# Patient Record
Sex: Female | Born: 1960 | Race: White | Hispanic: No | Marital: Married | Smoking: Never smoker
Health system: Southern US, Community
[De-identification: ages and names within clinical notes are randomized; demographics above are authoritative.]

## PROBLEM LIST (undated history)

## (undated) DIAGNOSIS — E7800 Pure hypercholesterolemia, unspecified: Secondary | ICD-10-CM

## (undated) HISTORY — PX: DILATION AND CURETTAGE OF UTERUS: SHX78

## (undated) HISTORY — PX: EYE SURGERY: SHX253

## (undated) HISTORY — DX: Pure hypercholesterolemia, unspecified: E78.00

## (undated) HISTORY — DX: Gilbert syndrome: E80.4

---

## 2003-05-22 ENCOUNTER — Encounter: Admission: RE | Admit: 2003-05-22 | Discharge: 2003-05-22 | Payer: Self-pay | Admitting: Gastroenterology

## 2003-05-22 IMAGING — US US ABDOMEN COMPLETE
1 series · 14 of 25 positions shown · non-contrast
Comparison: none

CLINICAL DATA: Elevated Bilirubin.
 ABDOMINAL ULTRASOUND COMPLETE
 Comparison ? none.
TECHNIQUE: Real-time multiplanar gray scale ultrasonography of the abdomen was performed.

[Series 1: unknown · 0.32mm/px · 14 of 63 slices shown]
[im 1/63]
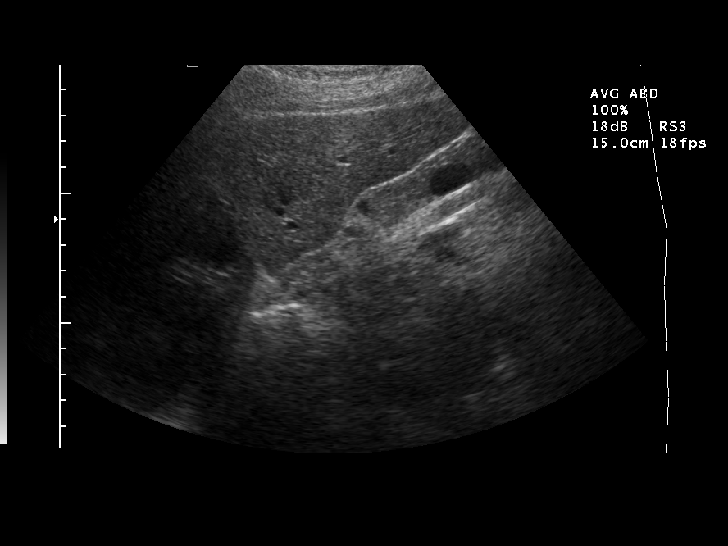
[im 6/63]
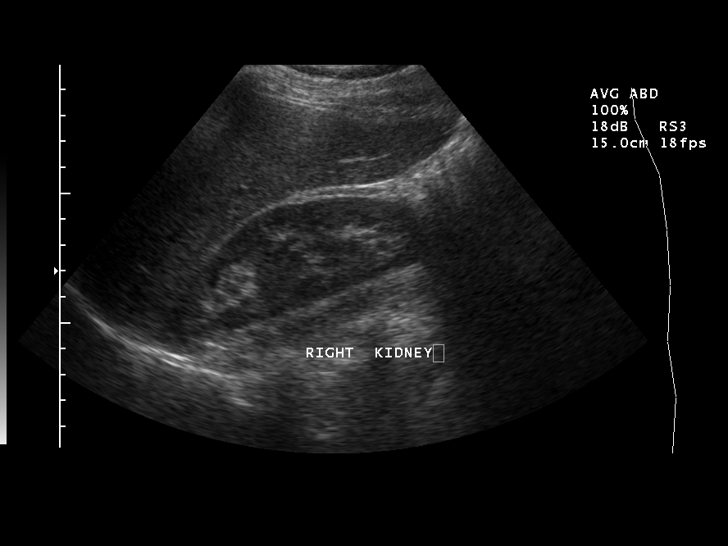
[im 11/63]
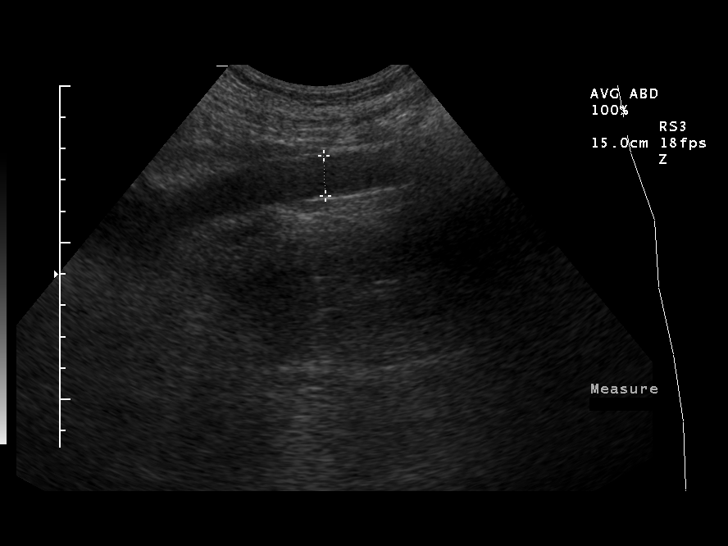
[im 16/63]
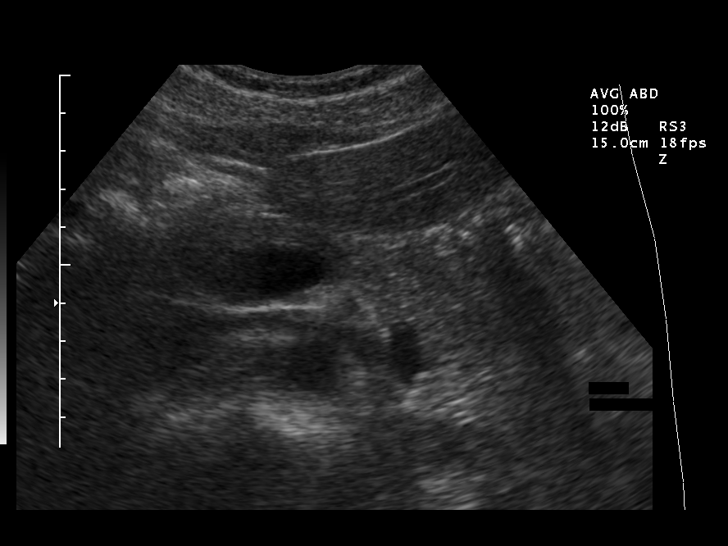
[im 21/63]
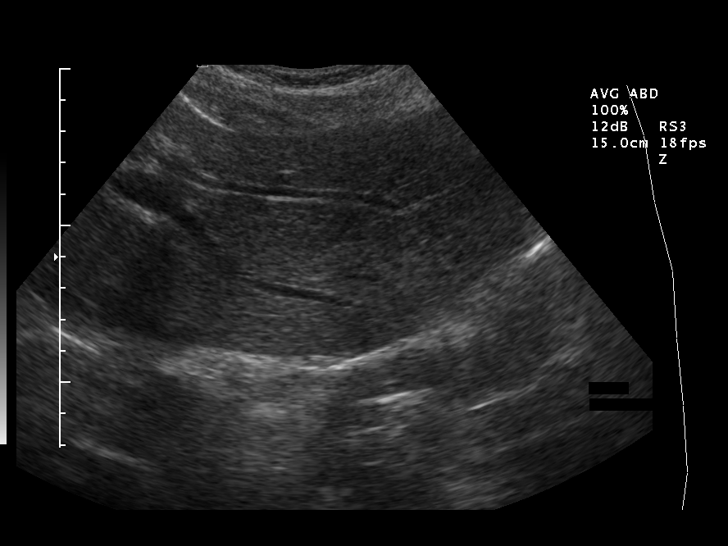
[im 24/63]
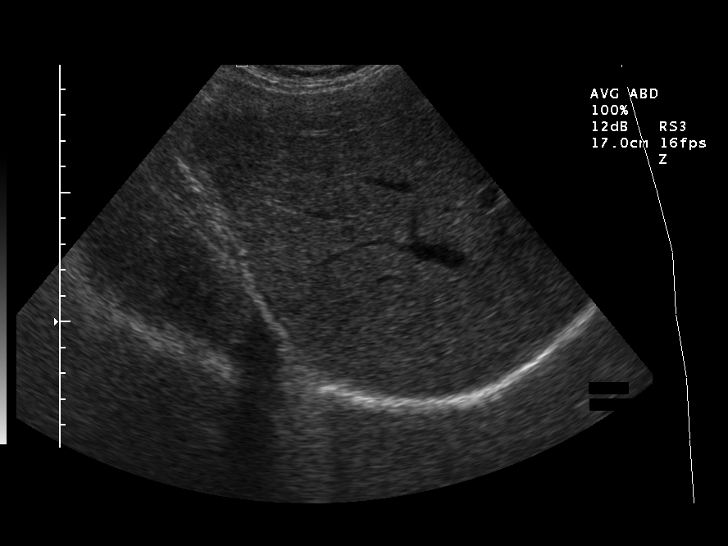
[im 29/63]
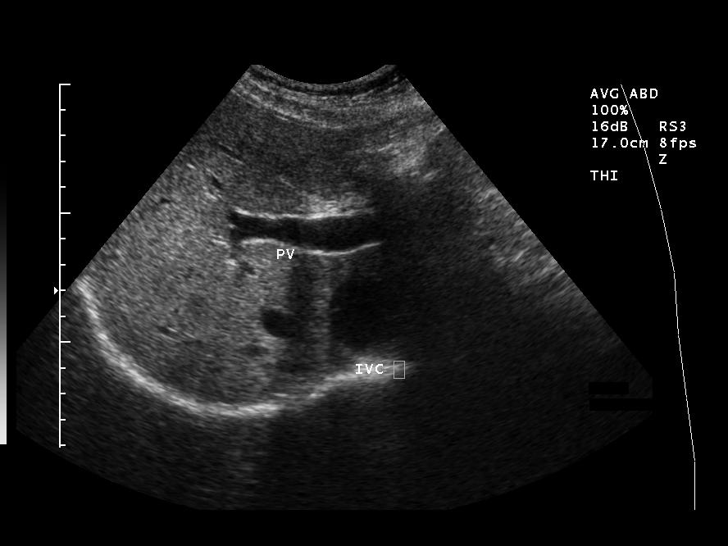
[im 34/63]
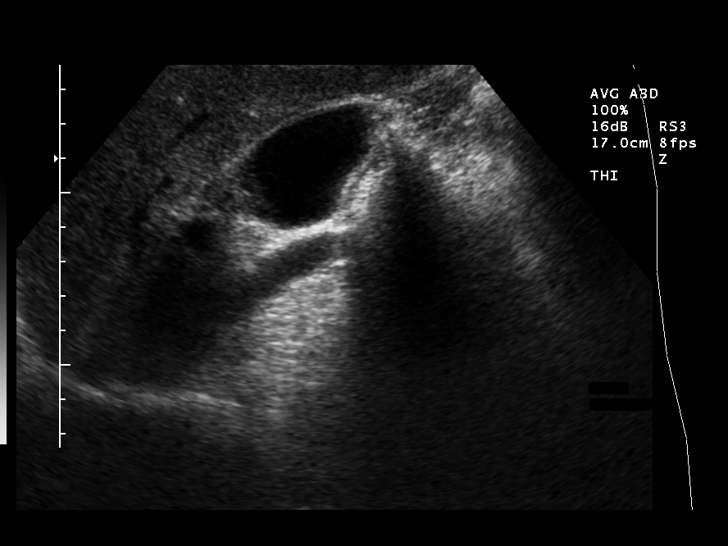
[im 39/63]
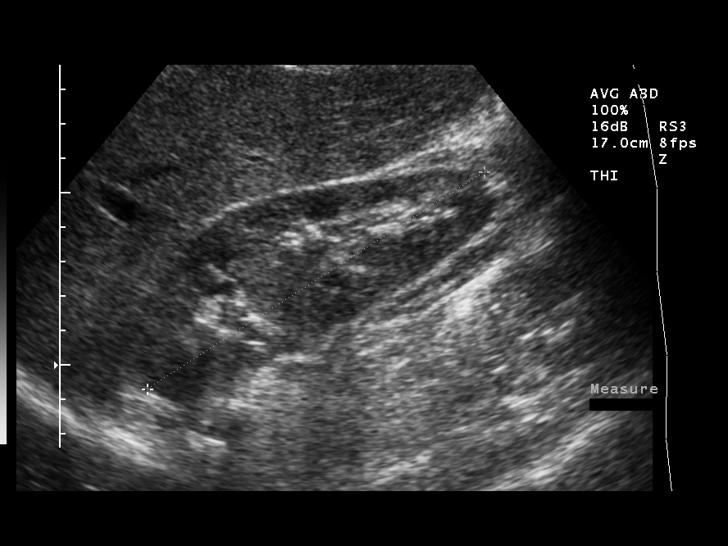
[im 42/63]
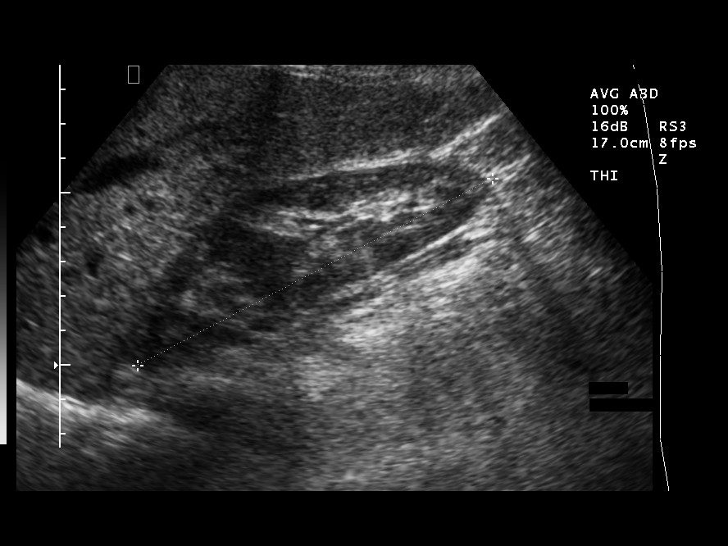
[im 47/63]
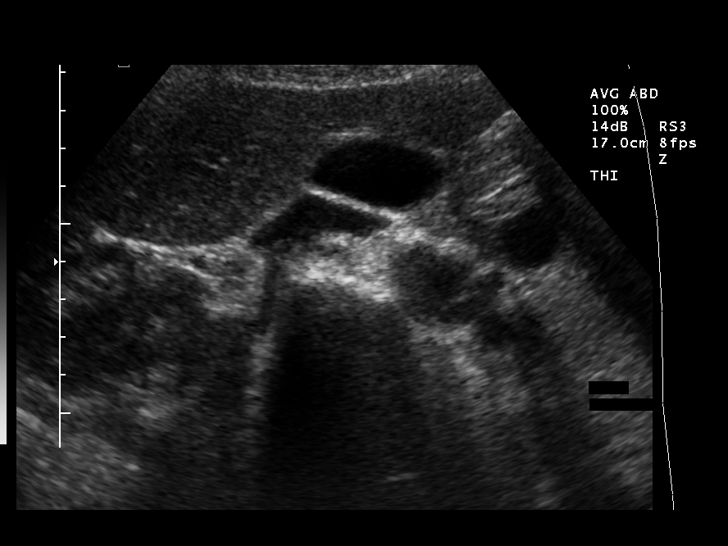
[im 52/63]
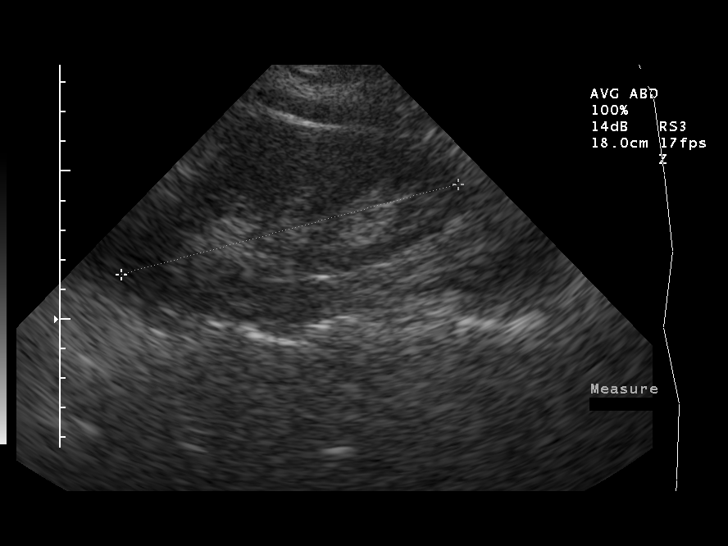
[im 57/63]
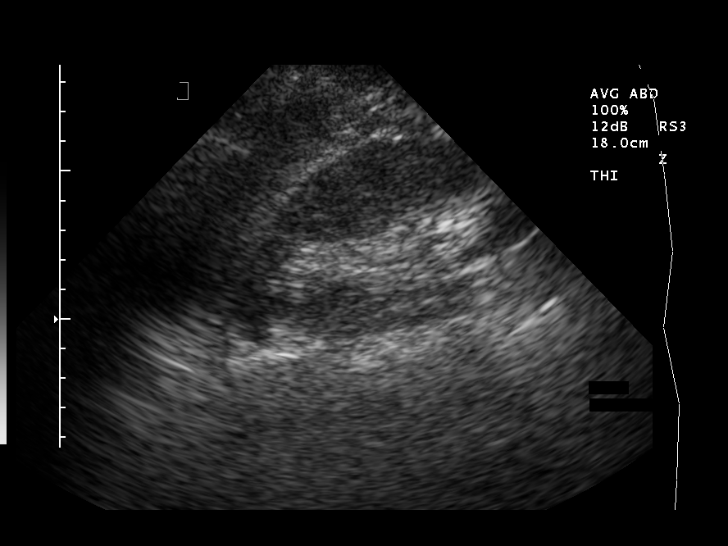
[im 63/63]
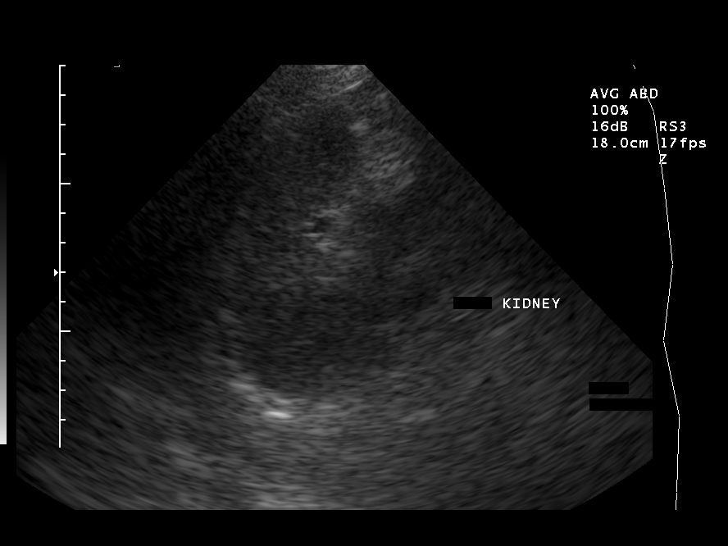

[14 of 25 positions shown; findings below may reference images not displayed]

FINDINGS: No focal abnormality is identified in the liver.  There is no intra or extrahepatic biliary duct dilatation.  The gallbladder
 has normal sonographic features.  
 The pancreas, aorta, spleen, and kidneys are unremarkable.  
 IMPRESSION
 Normal abdominal ultrasound.

## 2007-02-15 ENCOUNTER — Encounter: Admission: RE | Admit: 2007-02-15 | Discharge: 2007-02-15 | Payer: Self-pay | Admitting: Family Medicine

## 2007-02-15 IMAGING — MG MM DIAGNOSTIC BILATERAL
5 series · 5 of 5 positions shown · non-contrast
Comparison: There are no prior studies available for comparison.

DG DIAGNOSTIC BILATERAL
Bilateral CC and MLO view(s) were taken.

BILATERAL BREAST ULTRASOUND
Technologist: JARON, Medical
DIGITAL BILATERAL  DIAGNOSTIC MAMMOGRAM WITH CAD AND BILATERAL BREAST ULTRASOUND:
CLINICAL DATA: Palpable nodule right breast at approximately the 12 o'clock position.

[R CC]
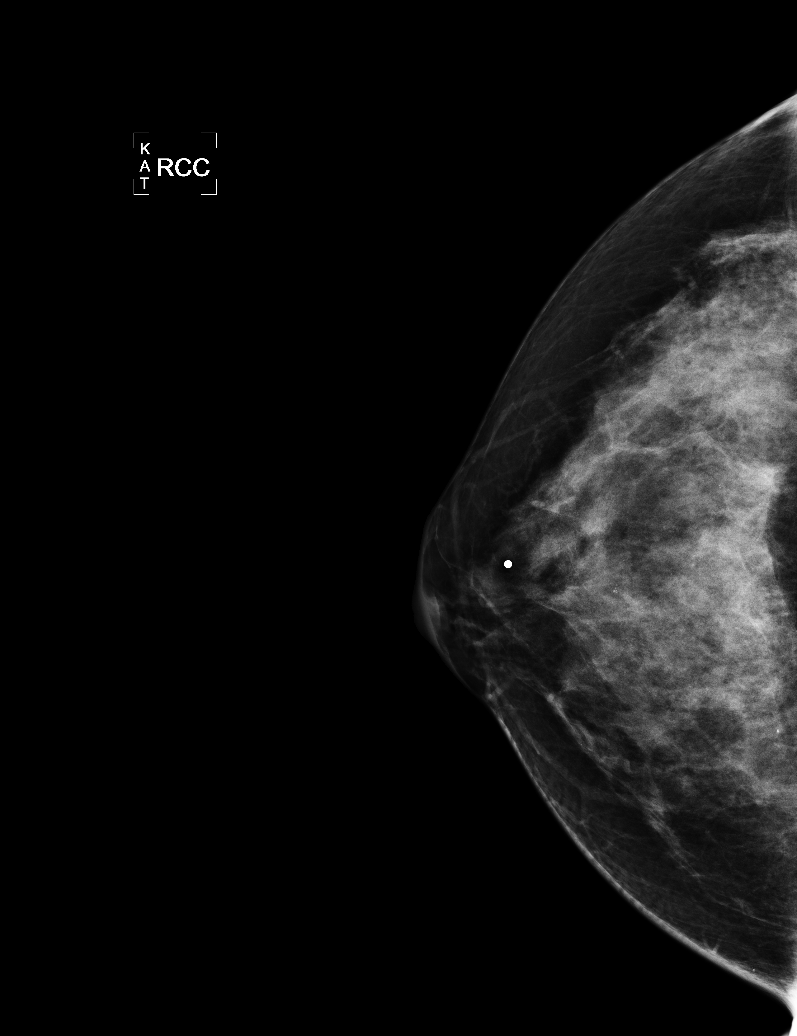

[L CC]
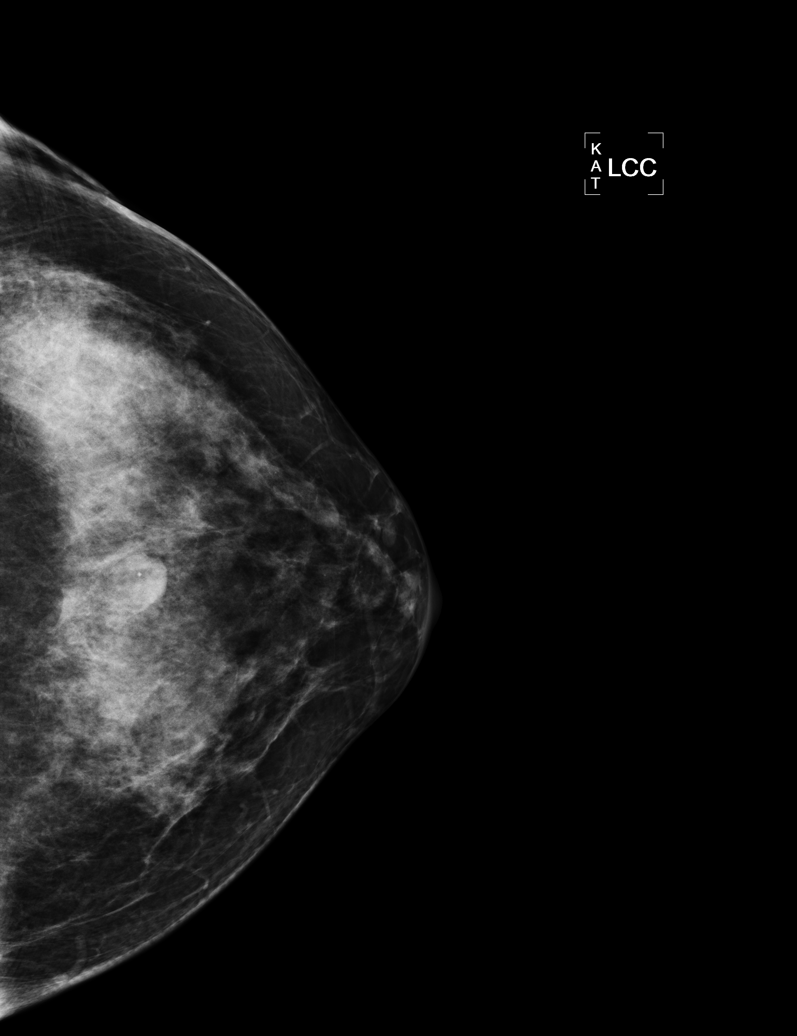

[L MLO]
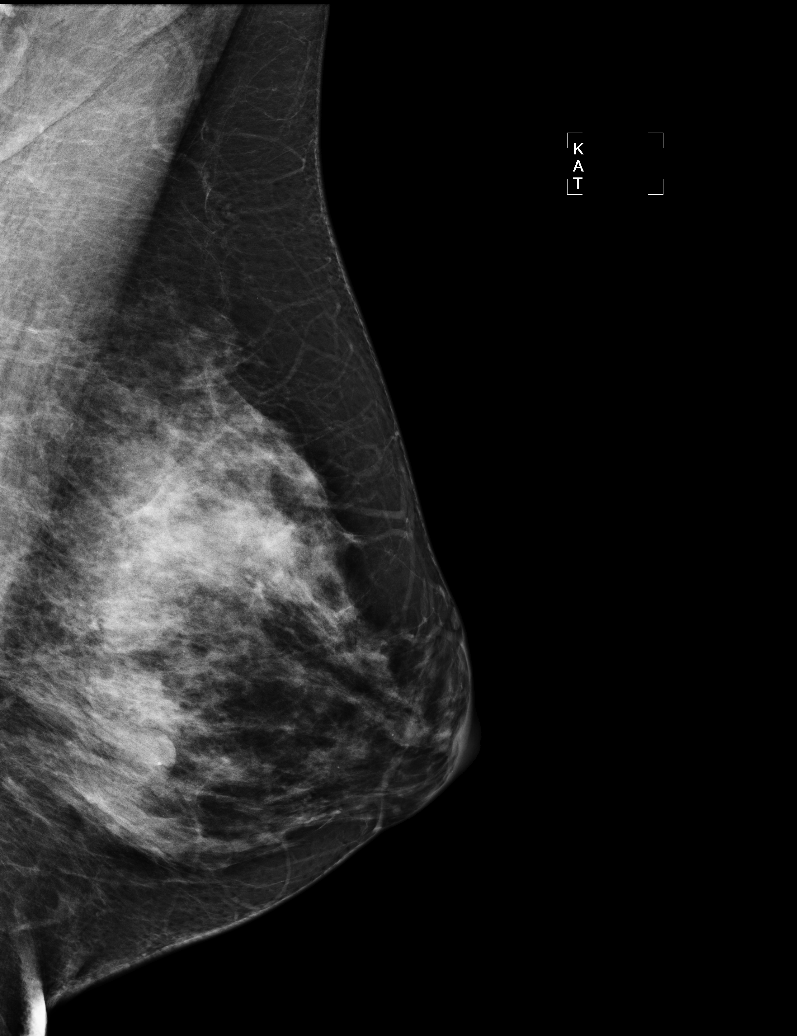

[R MLO]
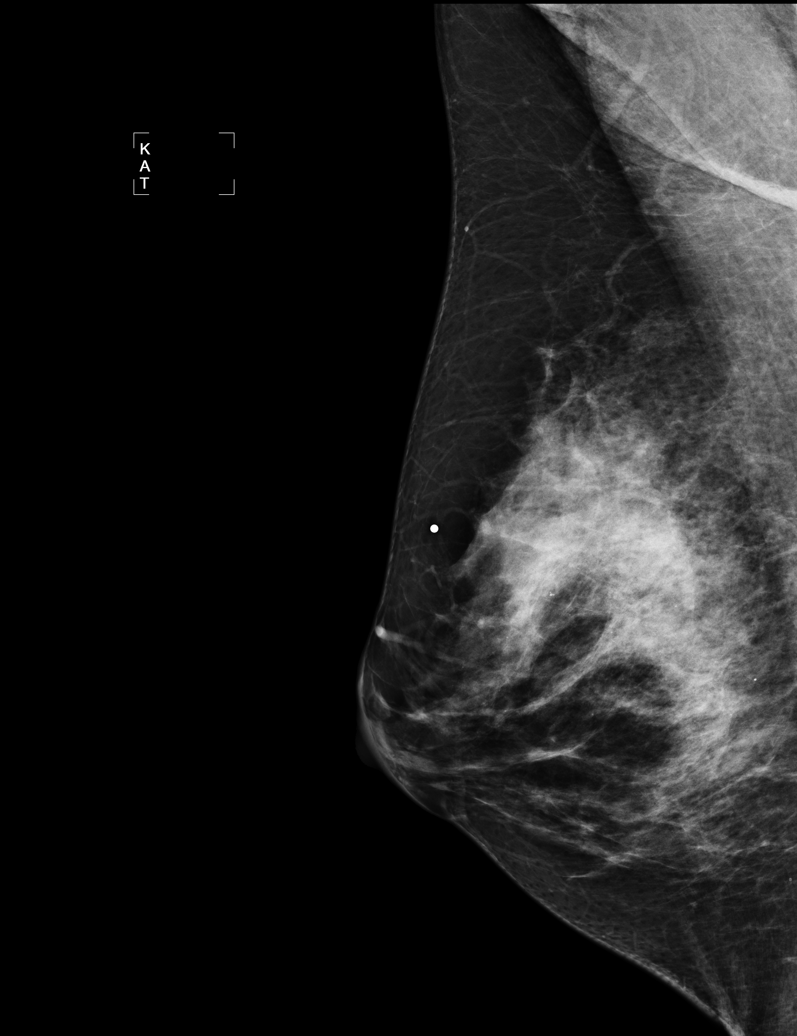

[R TAN]
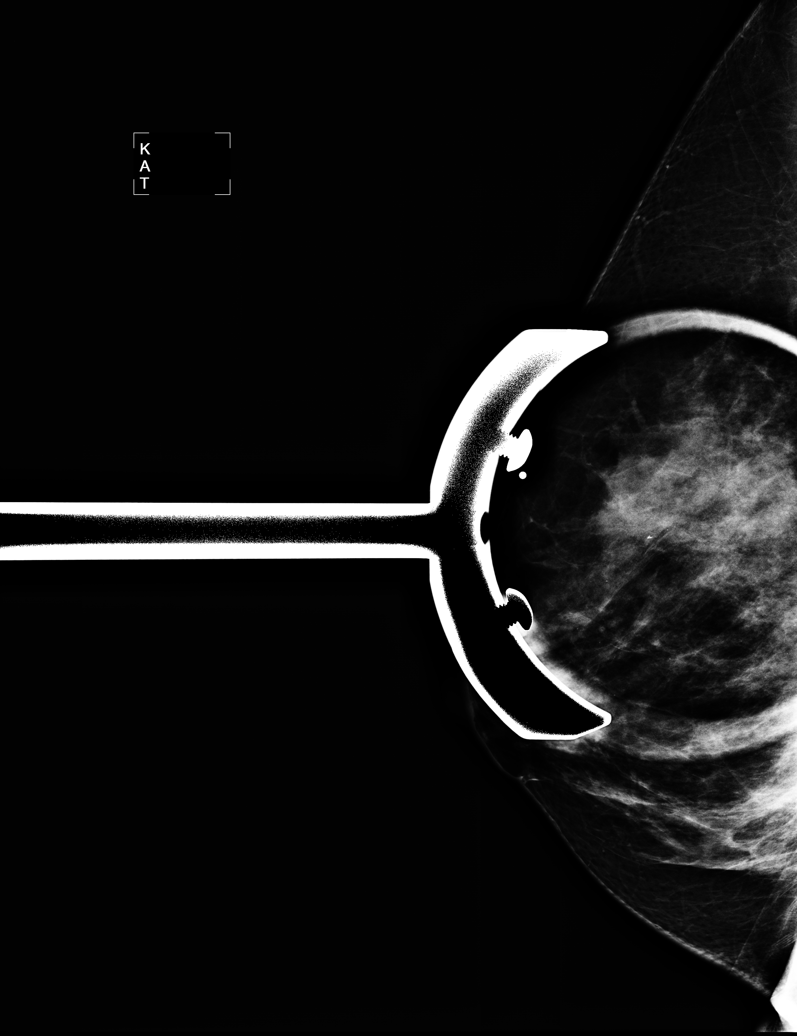

[5 of 5 positions shown; findings below may reference images not displayed]

There is an extremely dense breast parenchymal pattern.  There is a partially circumscribed nodular
density seen within the left breast at approximately the 4-5 o'clock position.  There are 
benign-appearing scattered microcalcifications present bilaterally.

Right breast ultrasound was performed in the area of the palpable area of nodularity.  There is a 
firm palpable nodule located at the 12 o'clock position within the right breast 3 cm from the 
nipple.  In this region, there are two adjacent cysts with a more anteriorly located cyst with a 
thickened wall measuring 1.3 cm in diameter and a more posteriorly located cyst also measuring
cm in diameter.  The patient is tender in this region and this likely represents an inflamed 
fibrocystic complex.  I do recommend repeat right breast ultrasound with physical examination in 
six weeks with attention to this area.  The importance of breast self examination was stressed with
the patient as well as yearly mammography.

Ultrasound of the left breast demonstrates a simple cyst measuring 1.3 cm in size located at the 5 
o'clock position within the left breast 2 cm from the nipple corresponding to smooth nodular 
density seen in this region on mammography.
IMPRESSION: 1.  Palpable area of nodularity located at the 12 o'clock position within the right breast which is
tender and likely is due to a fibrocystic complex with several adjacent cysts and the more 
anteriorly located cyst does contain thickened wall.  I would recommend follow-up right breast 
ultrasound in six weeks.  If the area persists and has not improved consider biopsy.
2.  Simple-appearing 1.3 cm in size located within the left breast at the 5 o'clock position 
corresponding to nodular density seen on mammography.

ASSESSMENT: Probably benign - BI-RADS 3

Ultrasound of the right breast in 2 months.
ANALYZED BY COMPUTER AIDED DETECTION. ,

## 2007-03-29 ENCOUNTER — Encounter: Admission: RE | Admit: 2007-03-29 | Discharge: 2007-03-29 | Payer: Self-pay | Admitting: Family Medicine

## 2007-03-29 IMAGING — US UNKNOWN US STUDY
1 series · 9 of 9 positions shown · non-contrast
Comparison: none

RIGHT BREAST ULTRASOUND

CLINICAL DATA: Tenderness of the right breast noted in [DATE] with an area of palpable 
nodularity at the 12 o'clock position within the right breast which demonstrated a fibrocystic 
complex with mild low level internal echoes present reevaluation.

[Series 1: unknown us study · 9 of 9 slices shown]
[im 1/9]
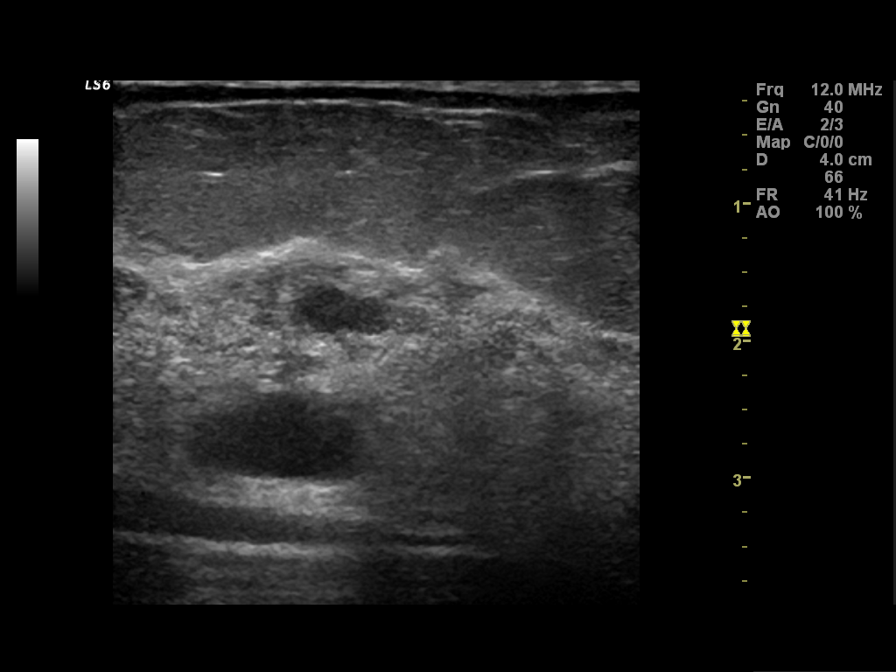
[im 2/9]
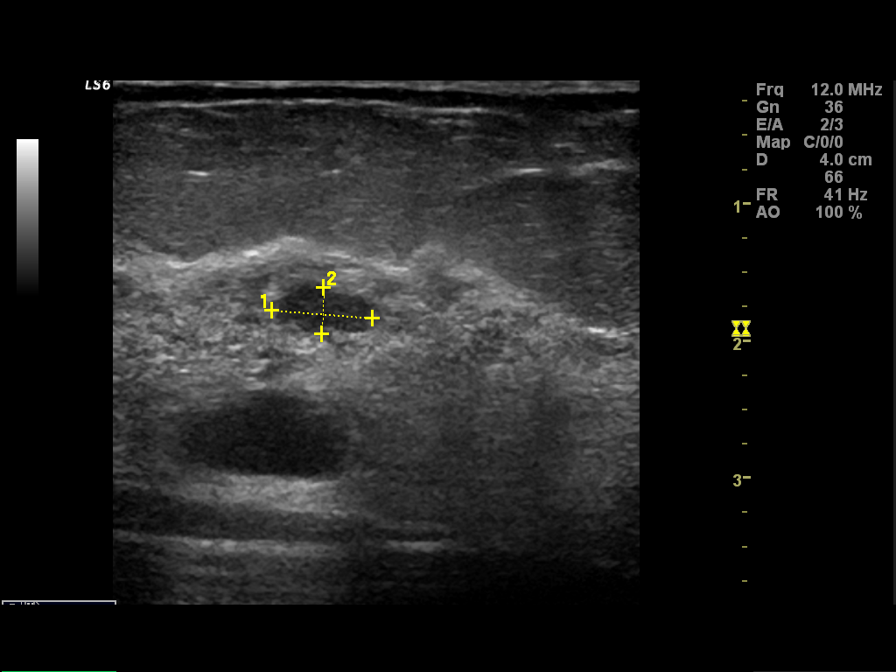
[im 3/9]
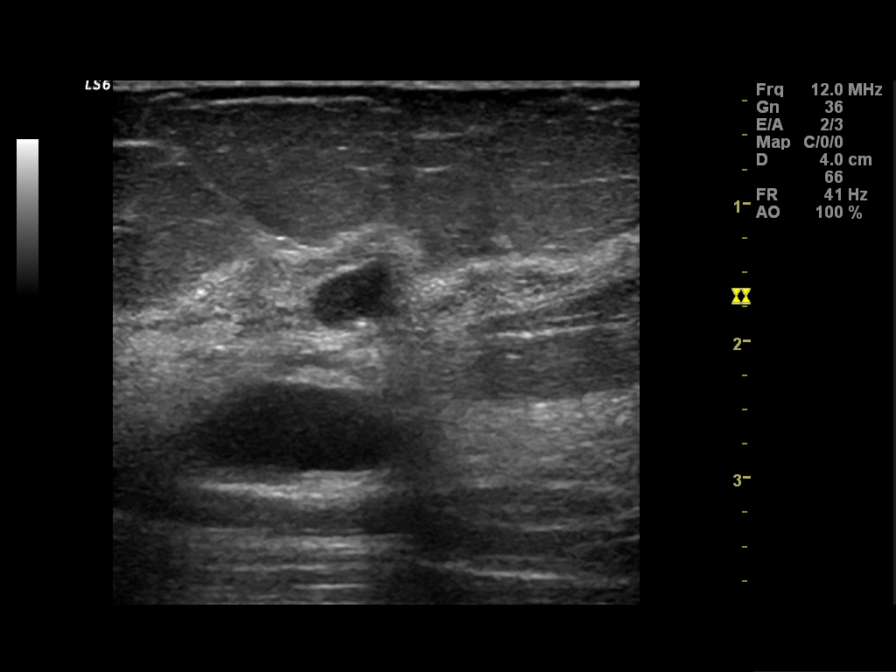
[im 4/9]
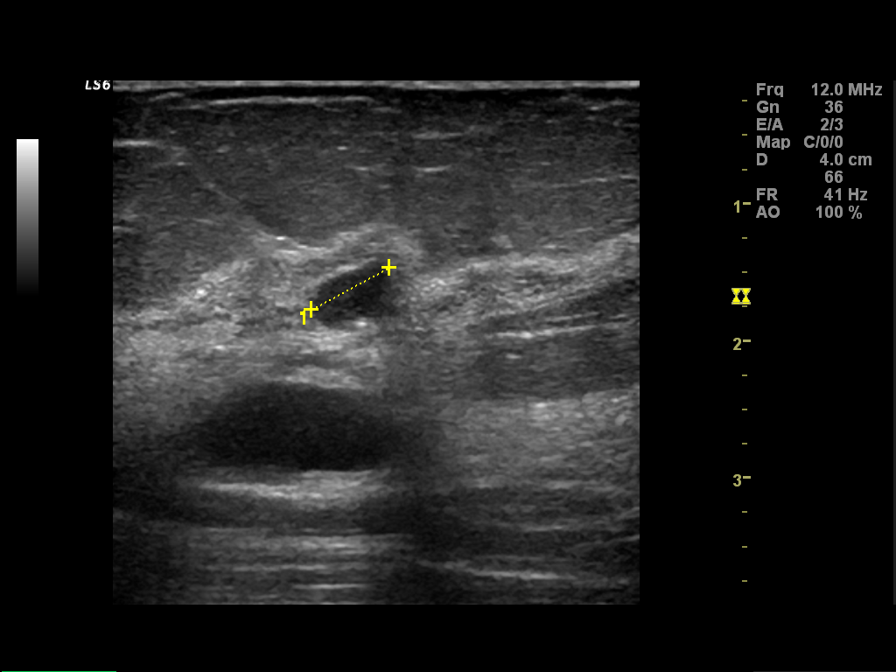
[im 5/9]
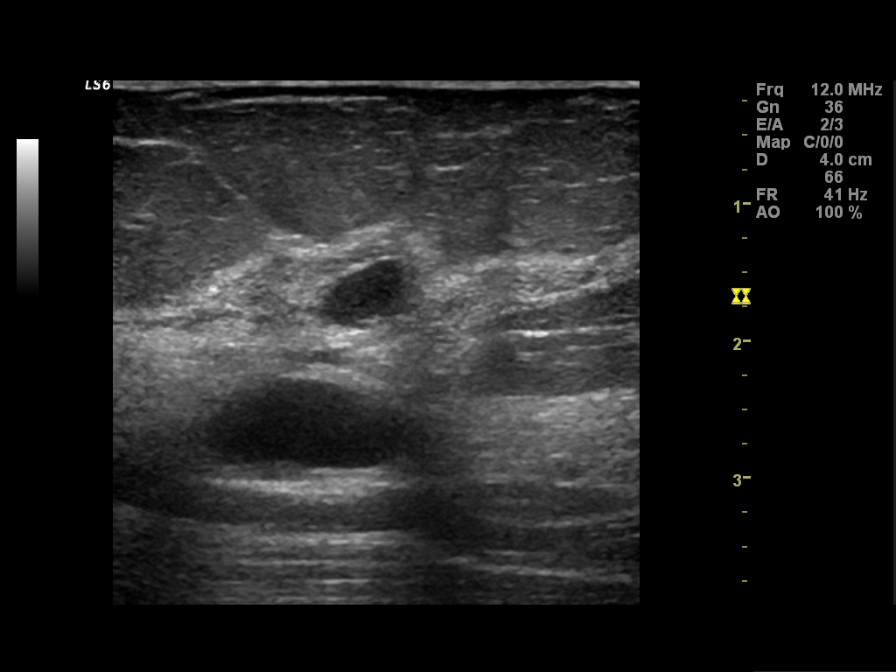
[im 6/9]
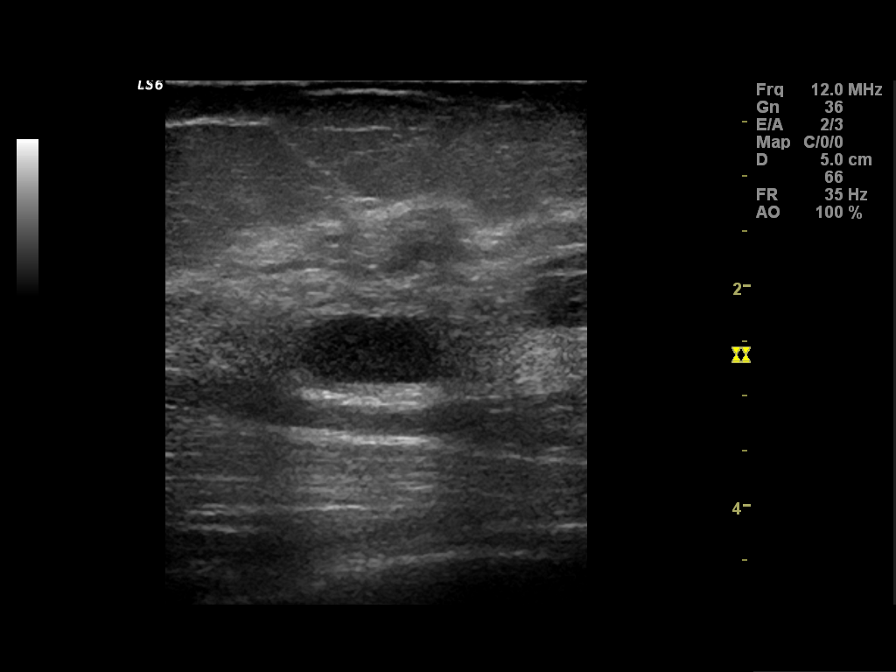
[im 7/9]
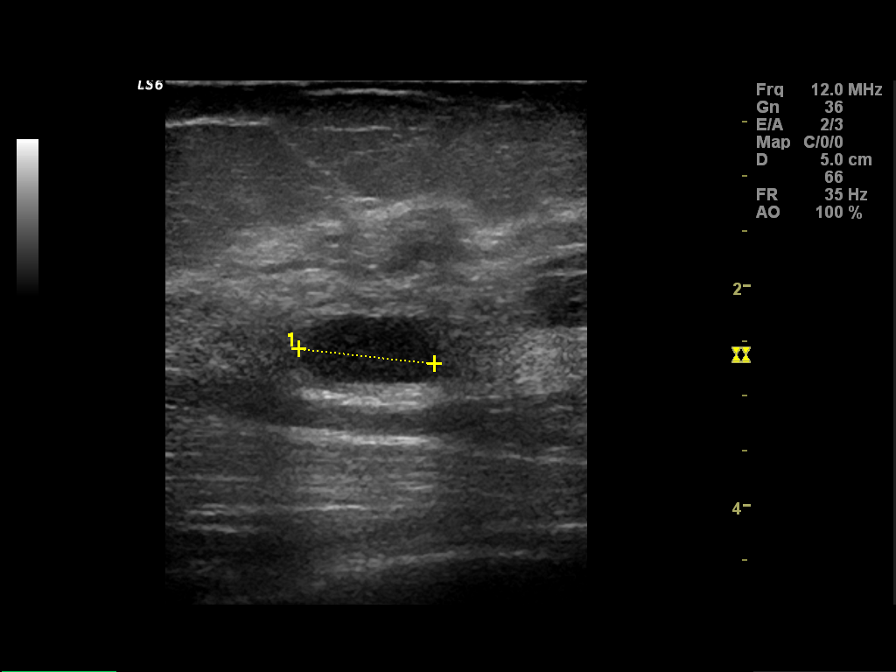
[im 8/9]
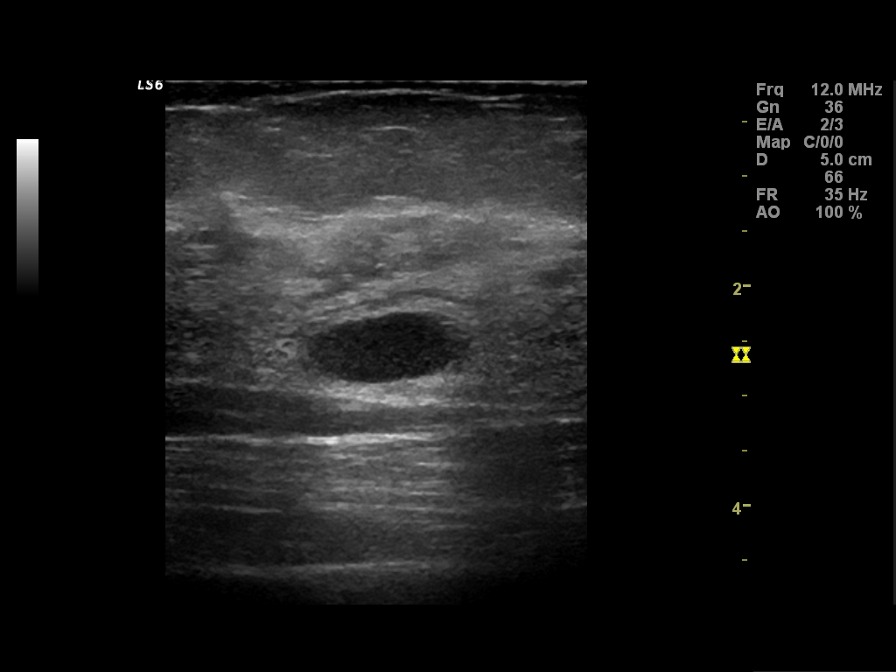
[im 9/9]
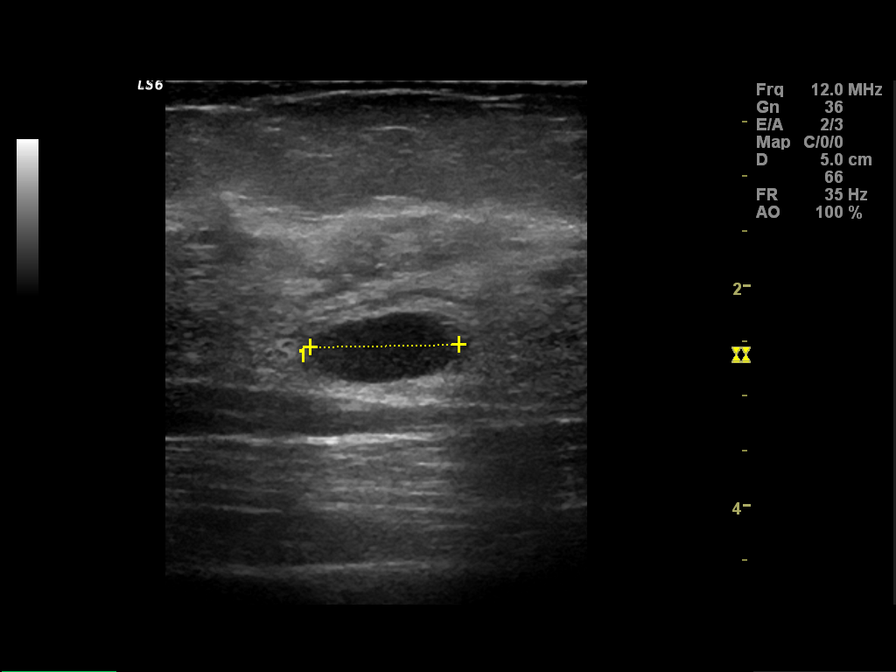

[9 of 9 positions shown; findings below may reference images not displayed]

Current study is compared with the prior study dated [DATE].

On today's study, the complex cyst located at the 12 o'clock position within the right breast 3 cm 
from the nipple has decreased in size and no measures 6 x 7 x 3 mm in size (previously 1.3 cm in 
diameter).  This is consistent with a resolving cyst.  There is a second 1.3 cm in size 
simple-appearing cyst located posterior to this mildly complex cyst located at the 12 o'clock 
position within the right breast as well.
IMPRESSION: Interval decrease in size of a complex cyst located at the 12 o'clock position within the right 
breast as discussed above.  This is consistent with a benign finding.  Recommend follow-up 
bilateral screening mammography in [DATE].

ASSESSMENT: Benign - BI-RADS 2

Screening mammogram of both breasts in 10 months.
,

## 2008-02-21 ENCOUNTER — Encounter: Admission: RE | Admit: 2008-02-21 | Discharge: 2008-02-21 | Payer: Self-pay | Admitting: Family Medicine

## 2008-02-21 IMAGING — MG MM DIGITAL SCREENING BILAT W/ CAD
4 series · 4 of 4 positions shown · non-contrast
Comparison: none

DG SCREEN MAMMOGRAM BILATERAL
Bilateral CC and MLO view(s) were taken.

DIGITAL SCREENING MAMMOGRAM WITH CAD:
The breast tissue is heterogeneously dense.  No masses or malignant type calcifications are 
identified.  Compared with prior studies.

[R CC]
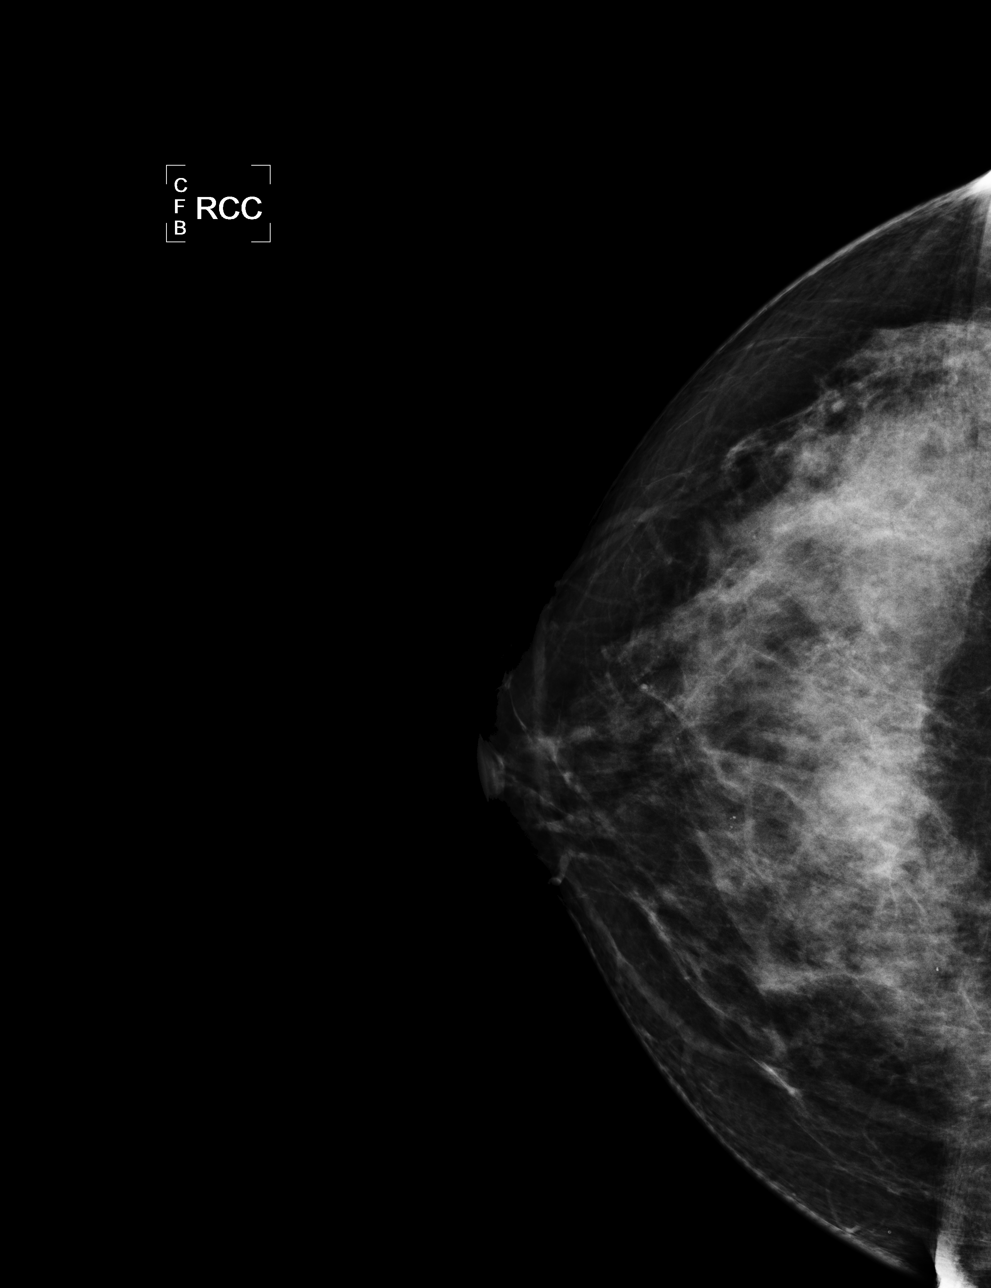

[L CC]
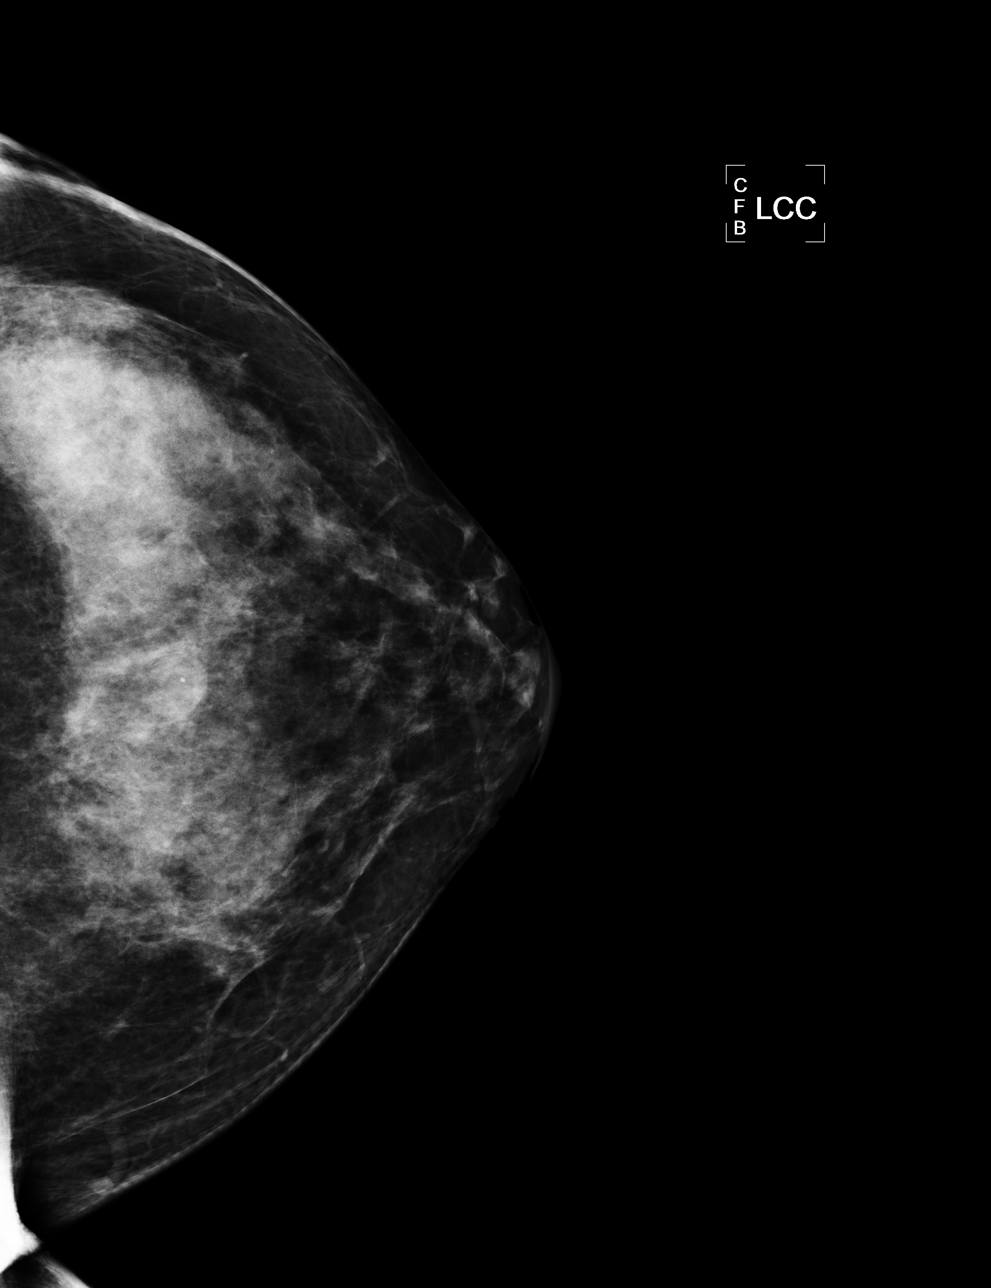

[L MLO]
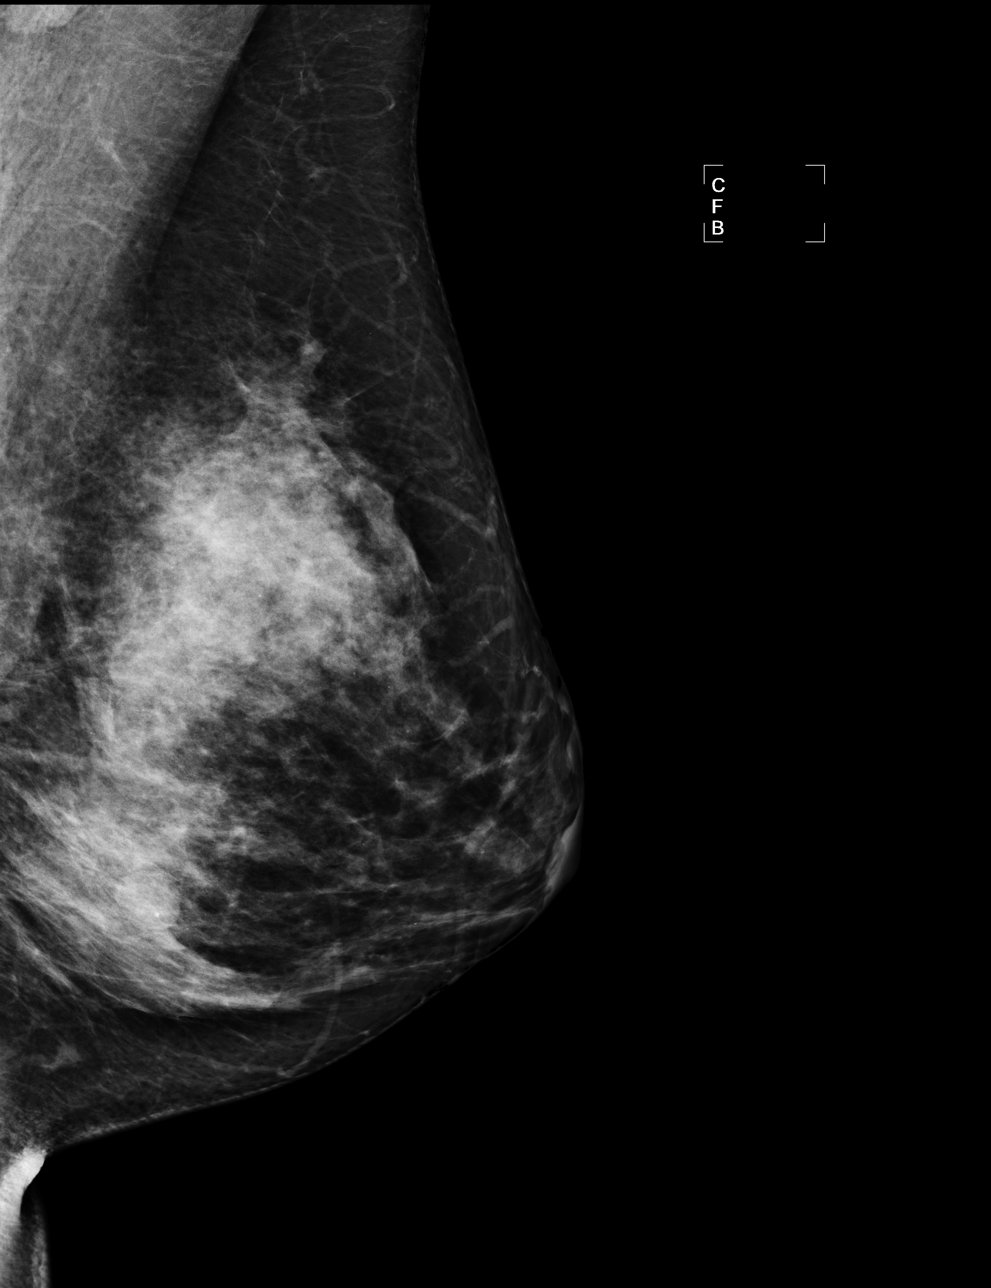

[R MLO]
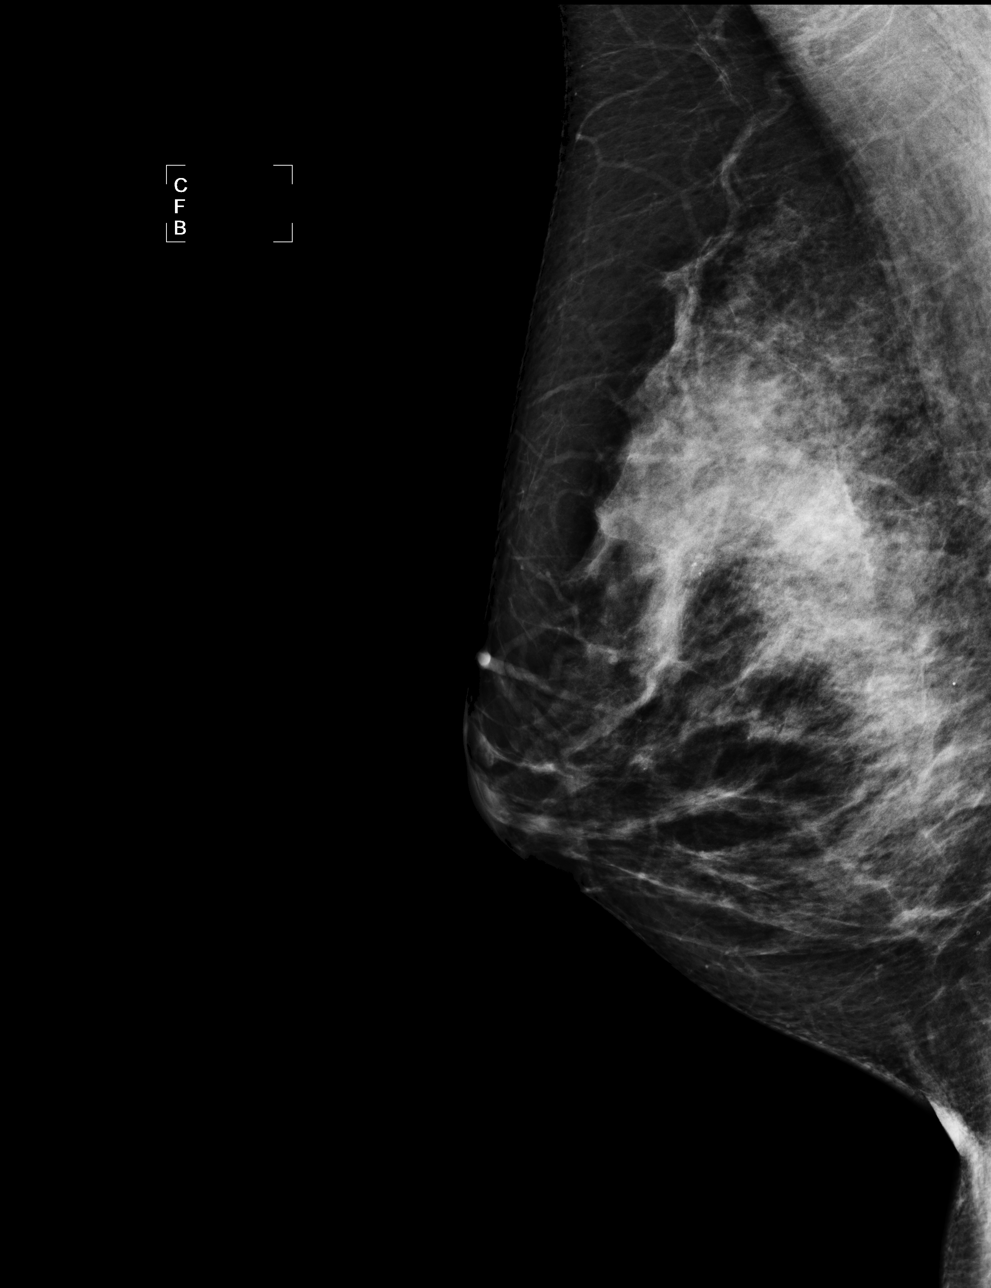

[4 of 4 positions shown; findings below may reference images not displayed]

IMPRESSION: No specific mammographic evidence of malignancy.  Next screening mammogram is recommended in one 
year.

ASSESSMENT: Negative - BI-RADS 1

Screening mammogram in 1 year.
ANALYZED BY COMPUTER AIDED DETECTION. , THIS PROCEDURE WAS A DIGITAL MAMMOGRAM.

## 2009-02-20 ENCOUNTER — Encounter: Admission: RE | Admit: 2009-02-20 | Discharge: 2009-02-20 | Payer: Self-pay | Admitting: Family Medicine

## 2009-02-20 IMAGING — MG MM SCREEN MAMMOGRAM BILATERAL
4 series · 4 of 4 positions shown · non-contrast
Comparison: none

DG SCREEN MAMMOGRAM BILATERAL
Bilateral CC and MLO view(s) were taken.

DIGITAL SCREENING MAMMOGRAM WITH CAD:
The breast tissue is heterogeneously dense.  No masses or malignant type calcifications are 
identified.  Compared with prior studies.
Images were processed with CAD.

[R CC]
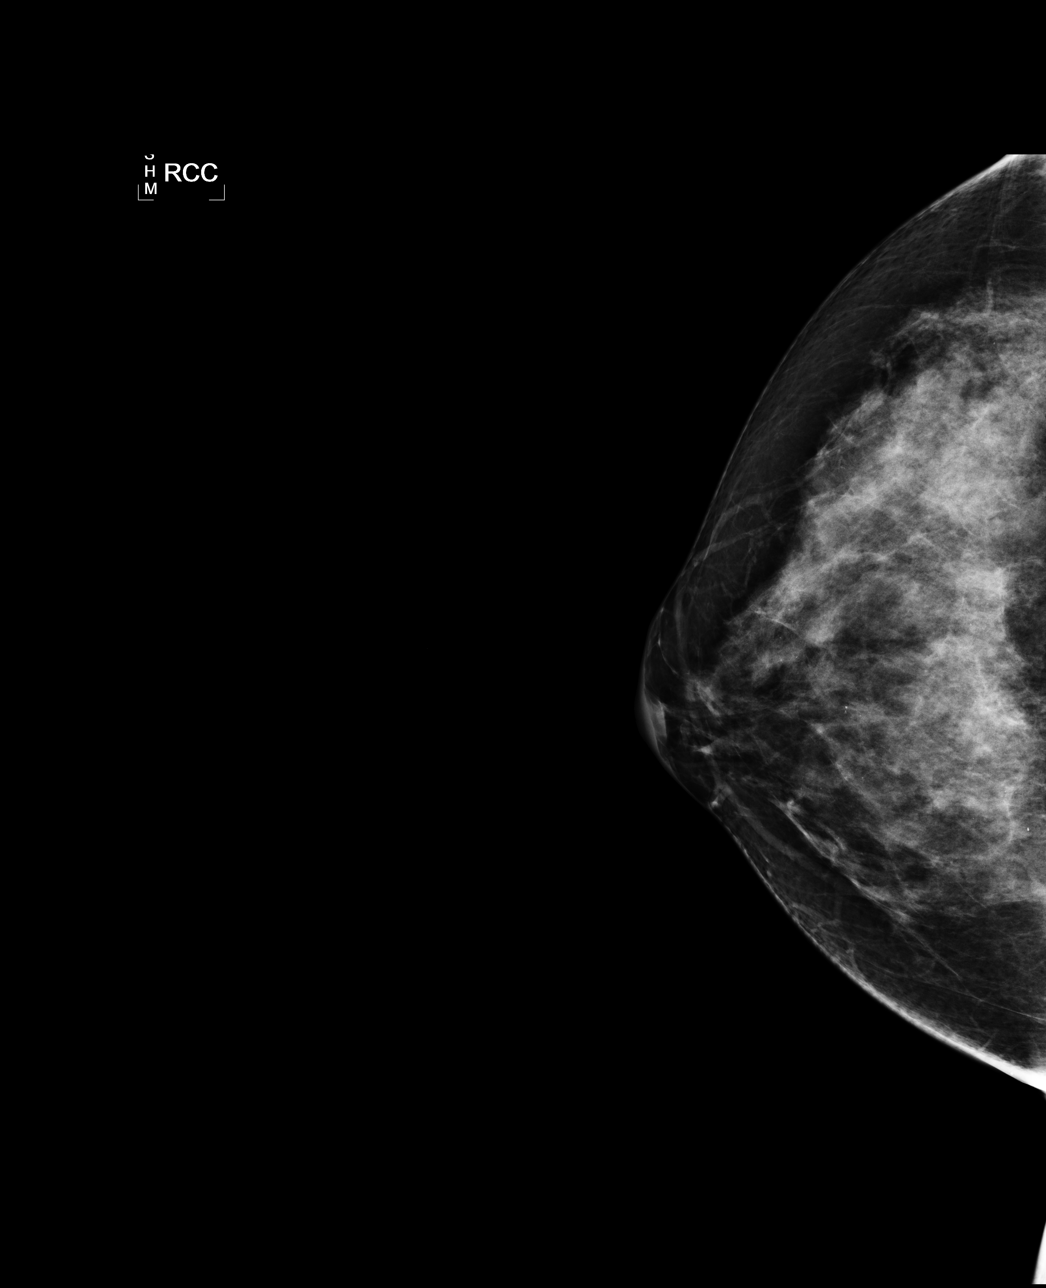

[L CC]
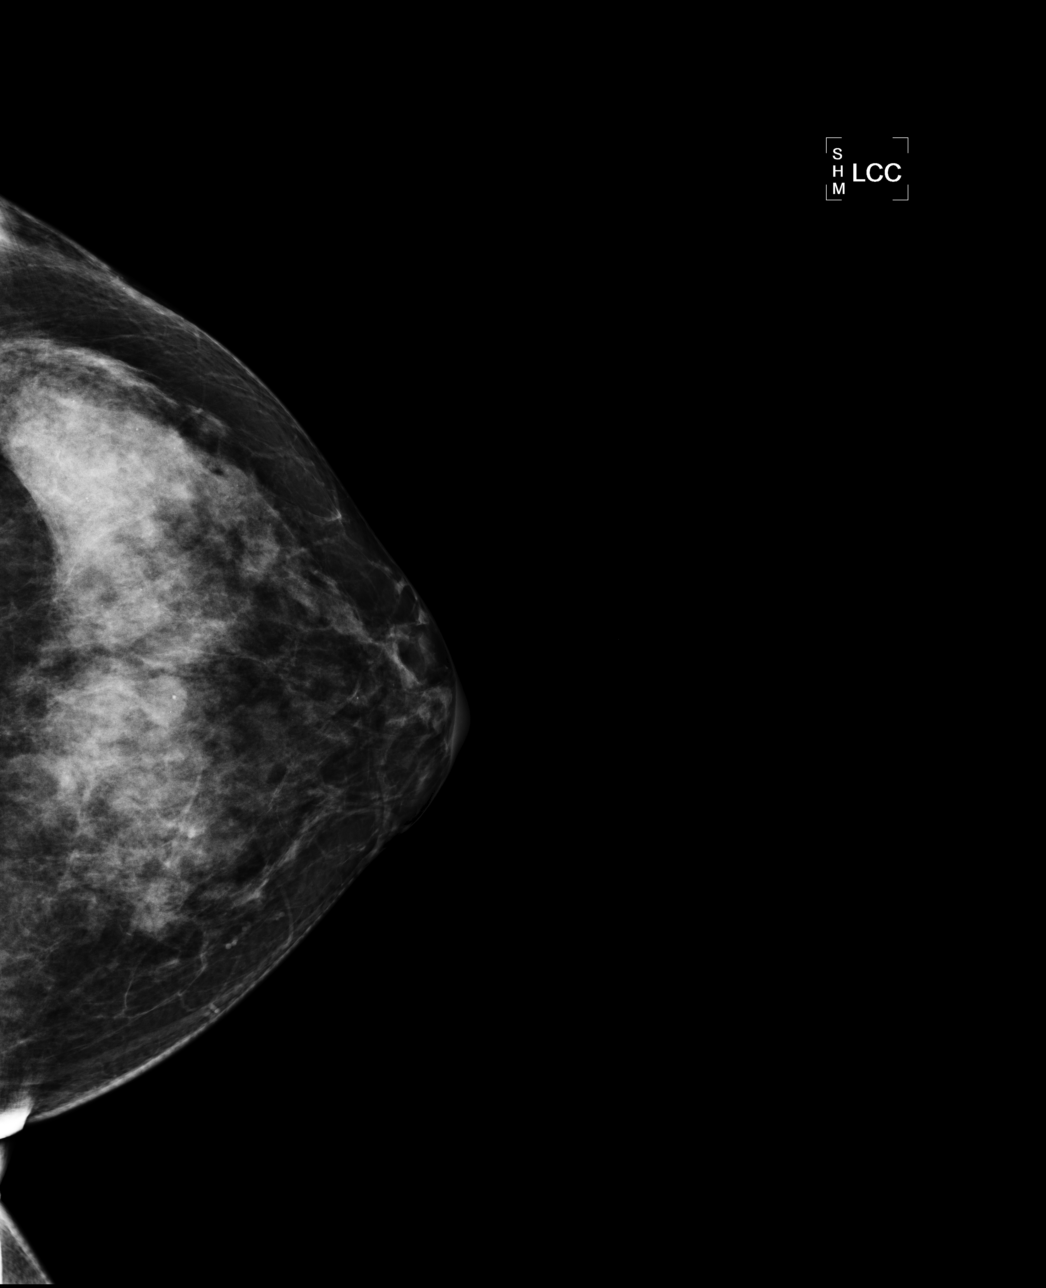

[L MLO]
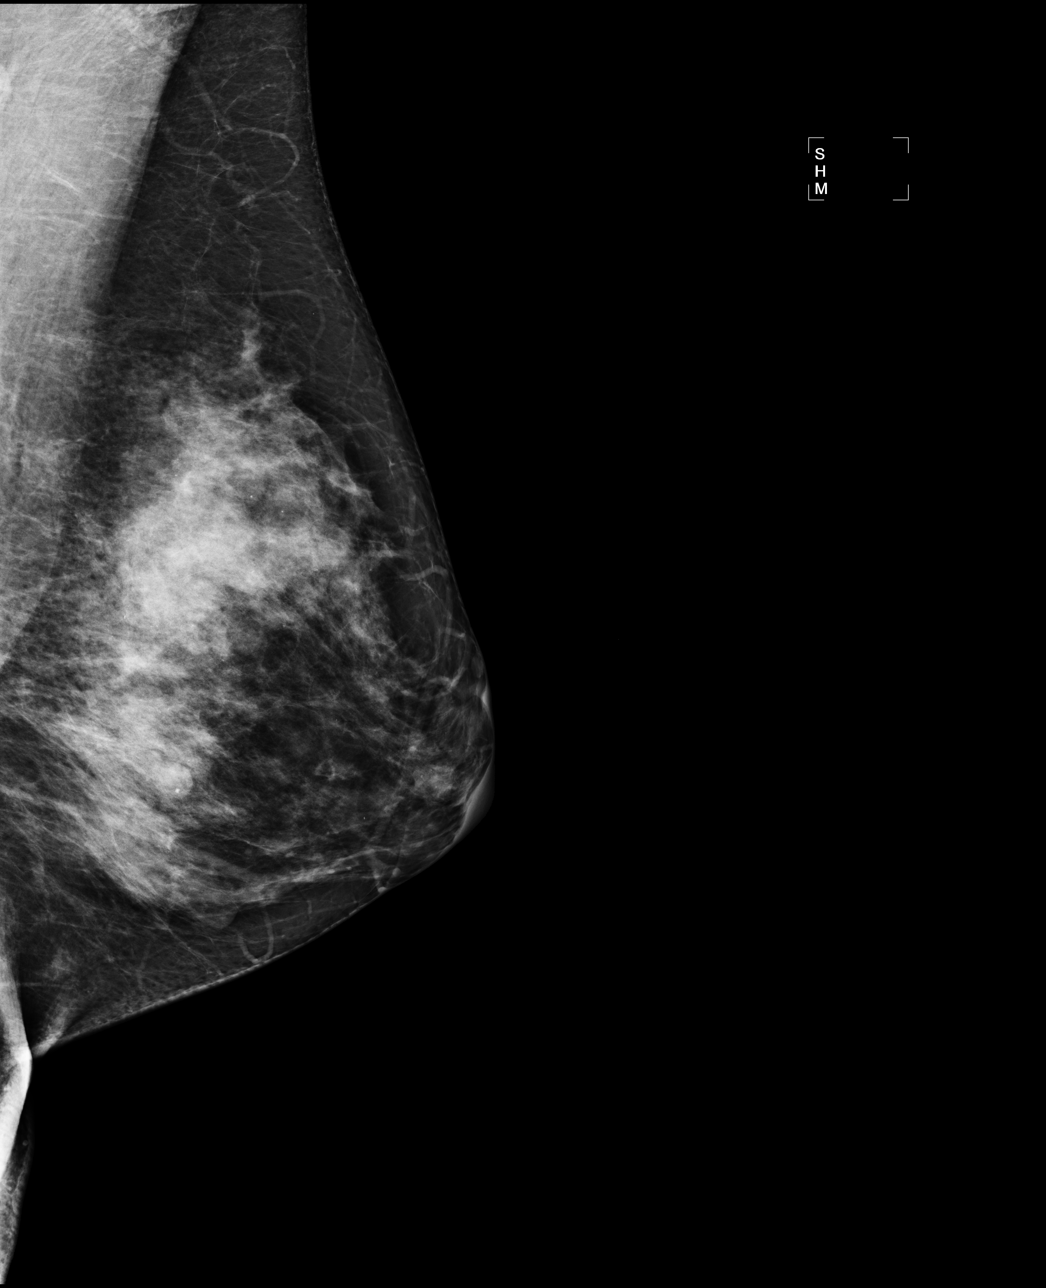

[R MLO]
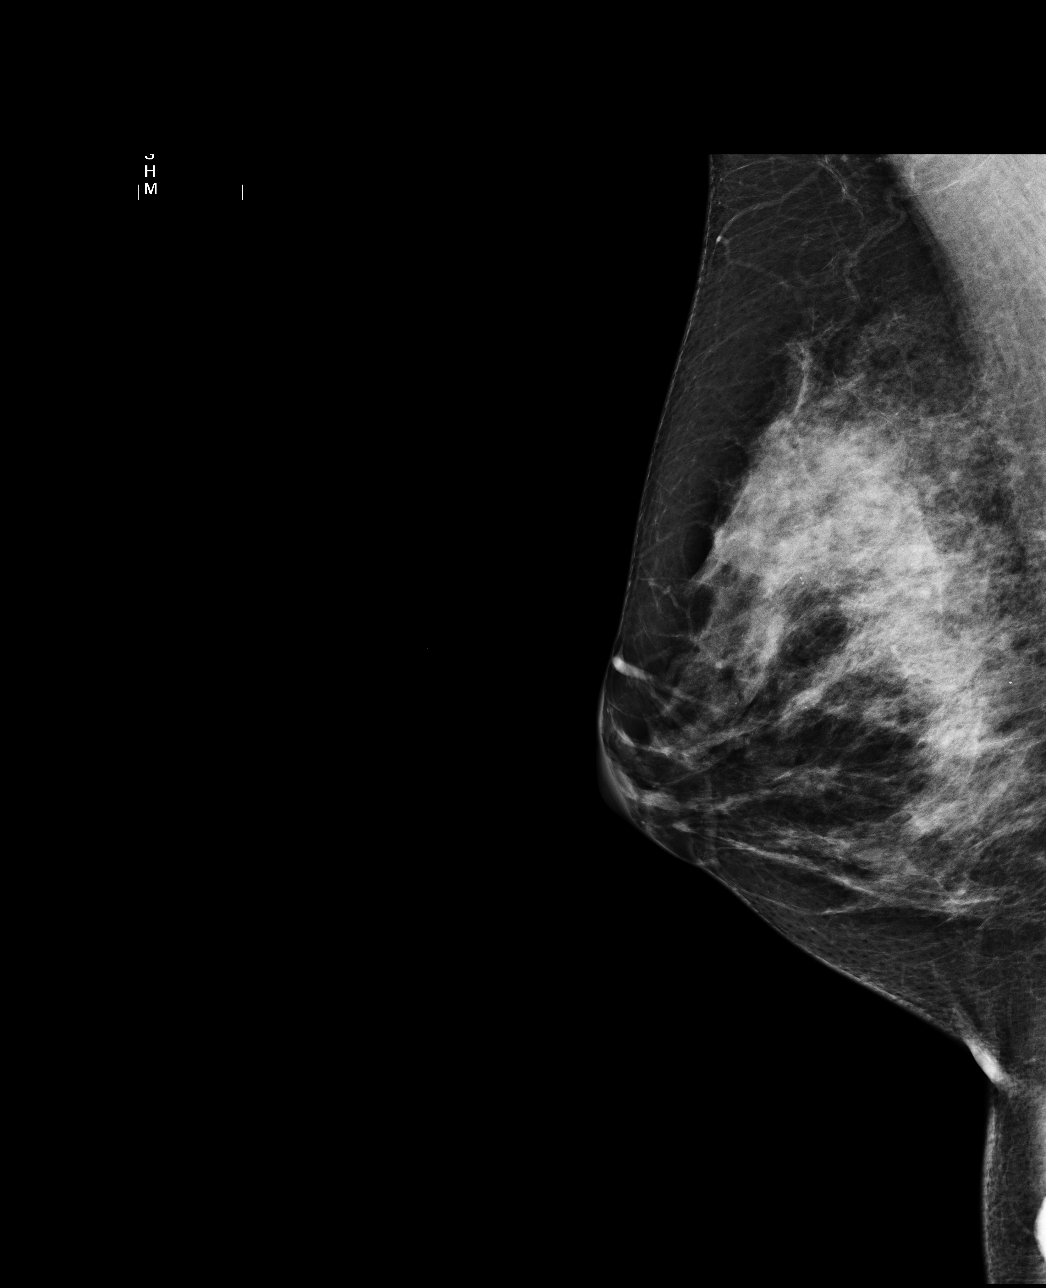

[4 of 4 positions shown; findings below may reference images not displayed]

IMPRESSION: No specific mammographic evidence of malignancy.  Next screening mammogram is recommended in one 
year.

A result letter of this screening mammogram will be mailed directly to the patient.

ASSESSMENT: Negative - BI-RADS 1

Screening mammogram in 1 year.
,

## 2010-02-05 ENCOUNTER — Encounter: Admission: RE | Admit: 2010-02-05 | Discharge: 2010-02-05 | Payer: Self-pay | Admitting: Family Medicine

## 2010-02-05 IMAGING — US US BREAST L
1 series · 5 of 5 positions shown · non-contrast
Comparison: [DATE] [DATE], [DATE], [DATE] [DATE], [DATE], [DATE] [DATE],
[DATE]

CLINICAL DATA: Tender palpable lump laterally on the left

DIGITAL DIAGNOSTIC bilateral MAMMOGRAM  AND left BREAST ULTRASOUND:

[Series 1: us breast left · 5 of 5 slices shown]
[im 1/5]
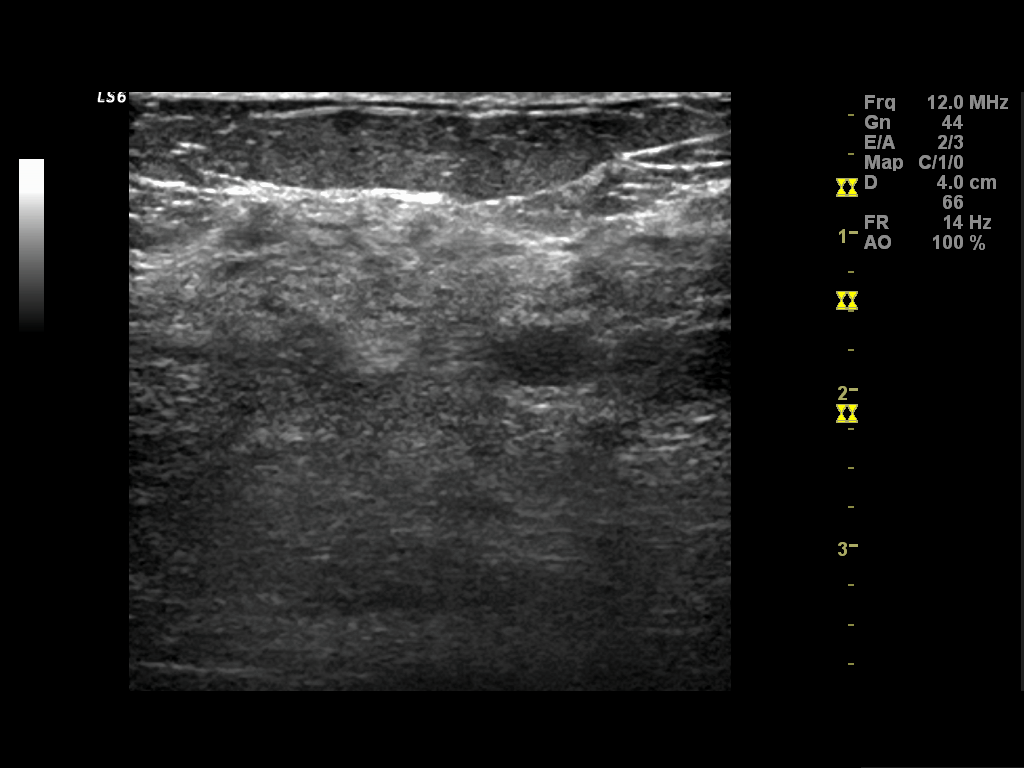
[im 2/5]
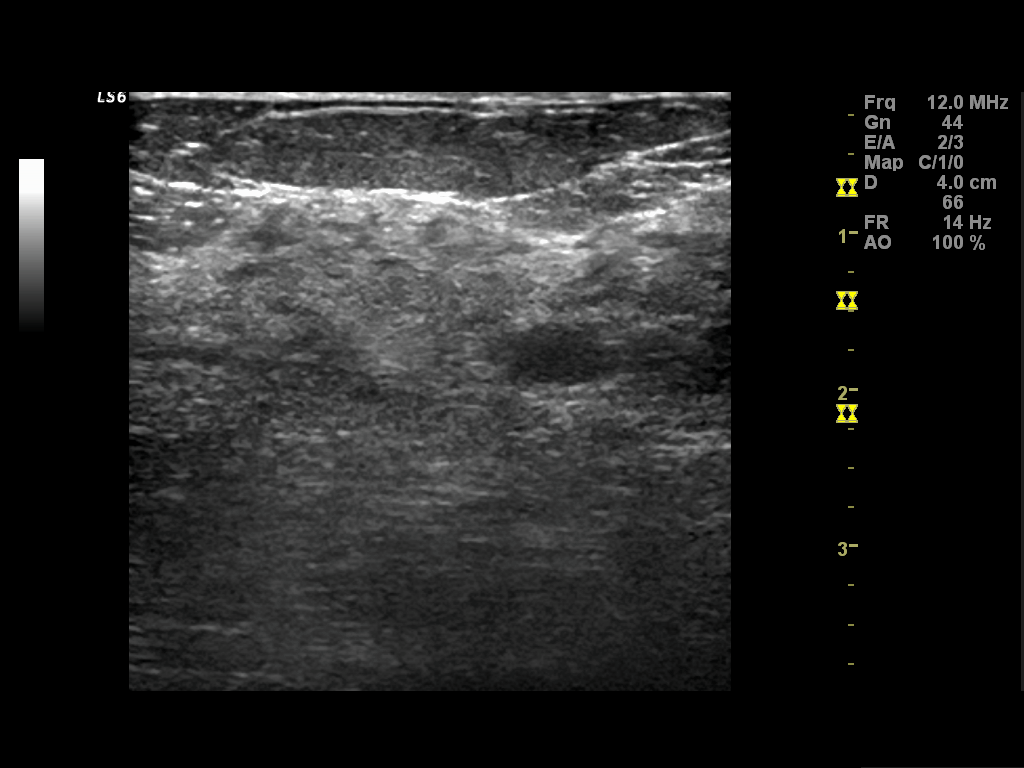
[im 3/5]
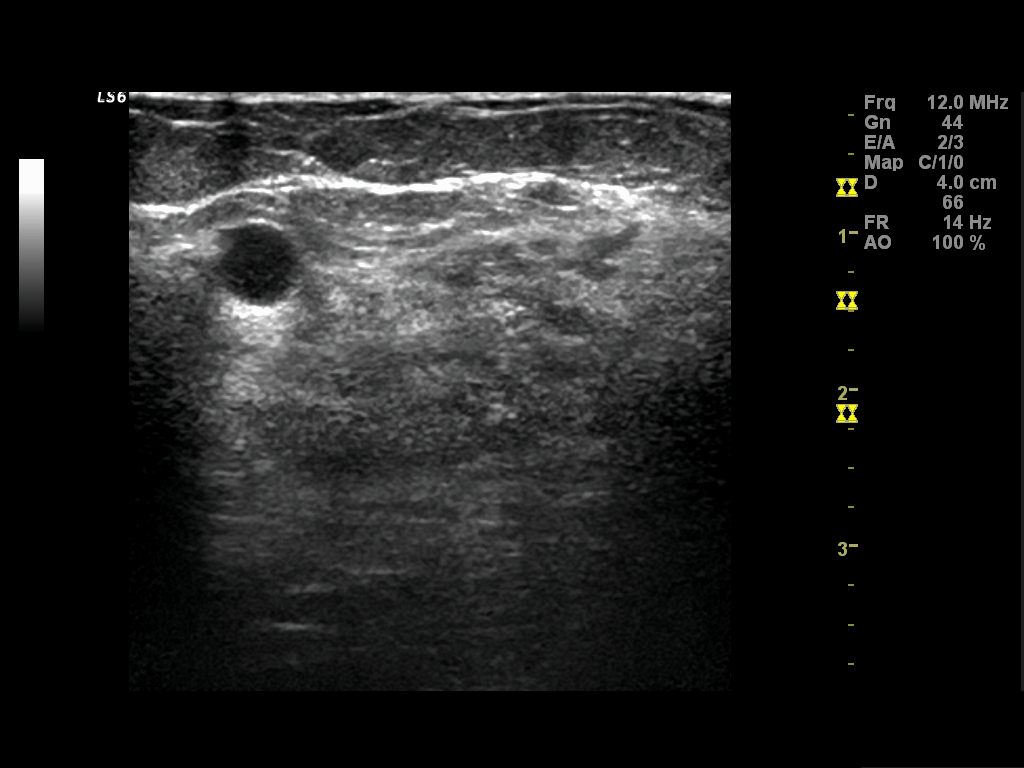
[im 4/5]
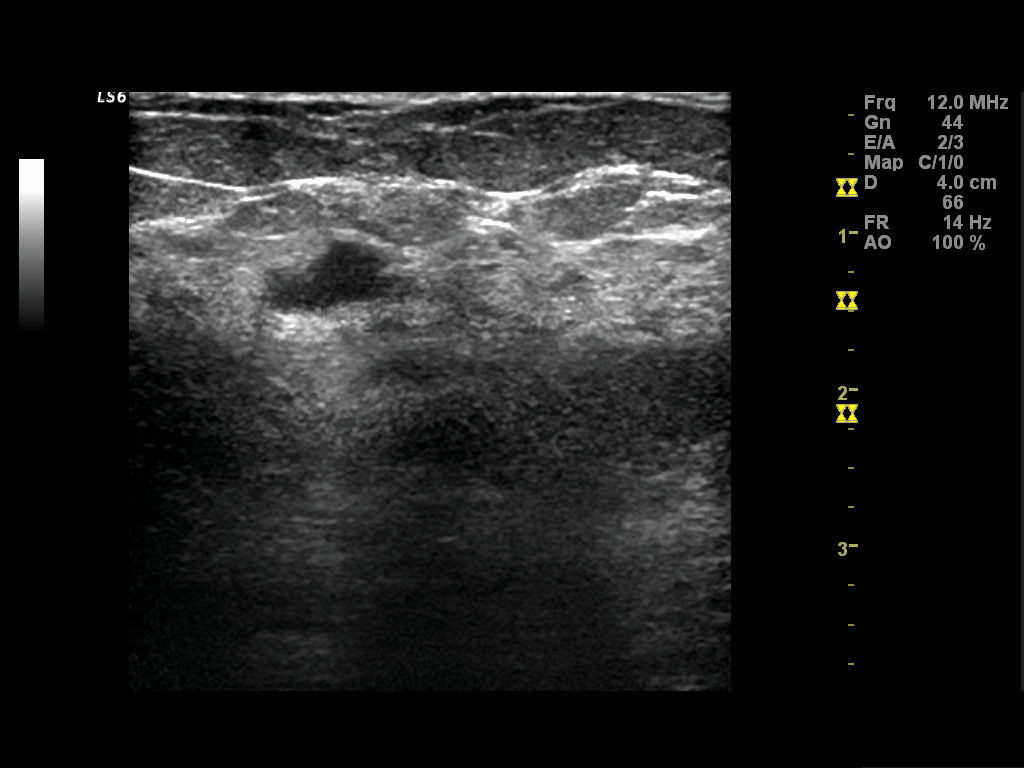
[im 5/5]
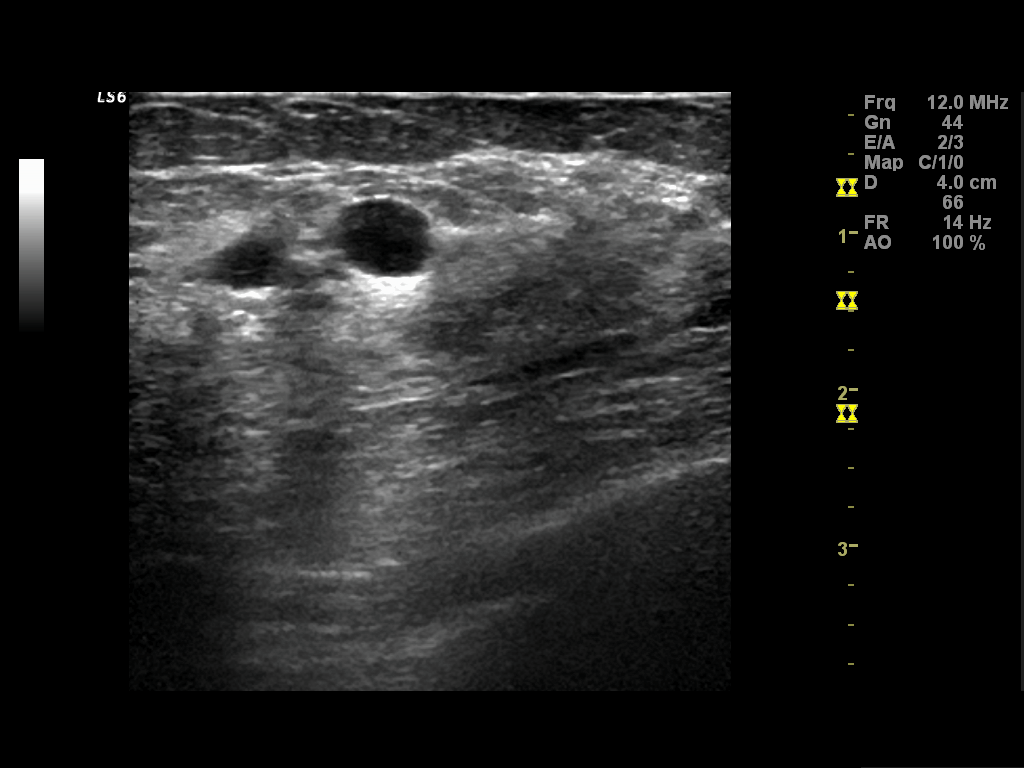

[5 of 5 positions shown; findings below may reference images not displayed]

FINDINGS: The dense tissue pattern is symmetric and stable.  There
are no dominant masses, suspicious calcifications, or areas of
architectural distortion bilaterally.

On physical exam, I cannot palpate a discrete lump within the
lateral left breast today where the patient is tender.

Ultrasound is performed, showing a dense ridge of normal-appearing
fibroglandular tissue and three simple cysts, the largest of which
measures 8 mm.  No suspicious solid masses or shadowing are
present.
IMPRESSION: There is no radiographic evidence of malignancy bilaterally.
Screening mammogram in 1 year is recommended.

BI-RADS CATEGORY 2:  Benign finding(s).

## 2010-02-05 IMAGING — MG MM DIGITAL DIAGNOSTIC BILAT CAD
5 series · 5 of 5 positions shown · non-contrast
Comparison: [DATE] [DATE], [DATE], [DATE] [DATE], [DATE], [DATE] [DATE],
[DATE]

CLINICAL DATA: Tender palpable lump laterally on the left

DIGITAL DIAGNOSTIC bilateral MAMMOGRAM  AND left BREAST ULTRASOUND:

[R CC]
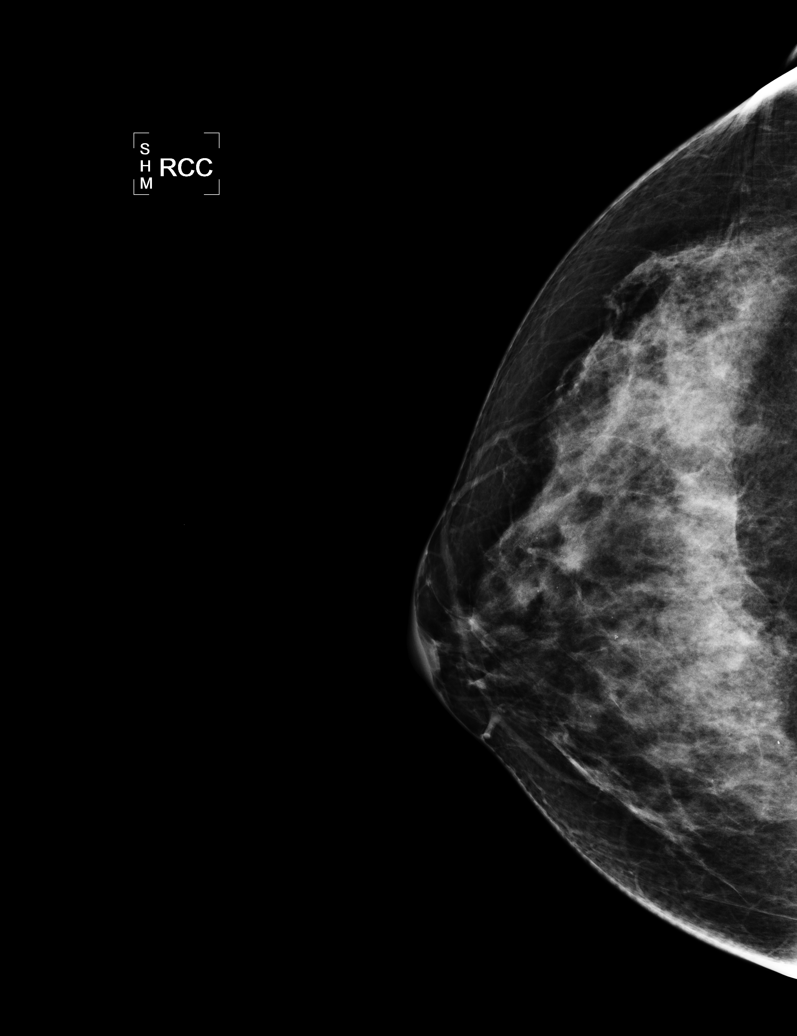

[L CC]
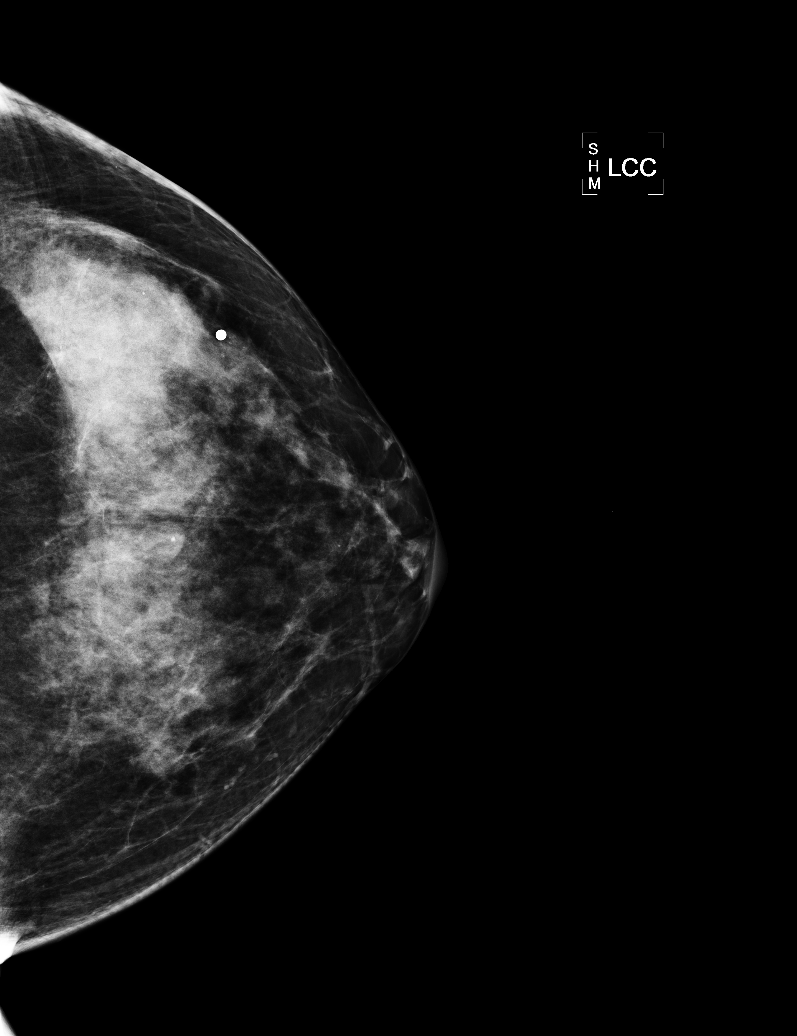

[L MLO]
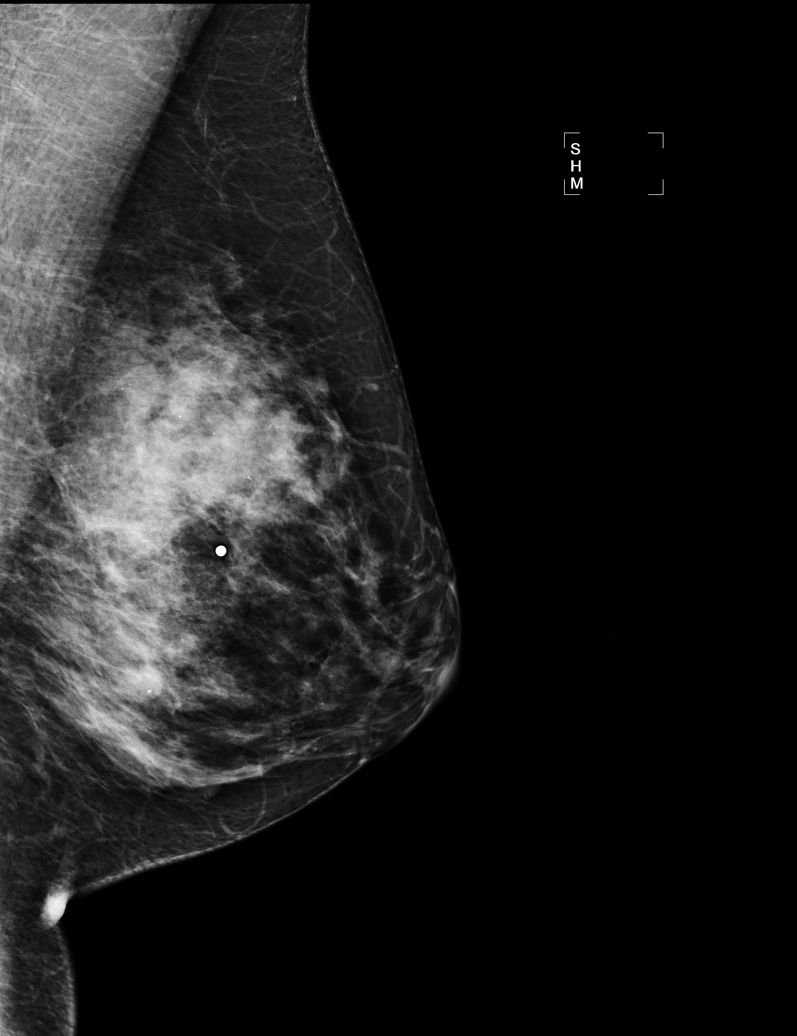

[R MLO]
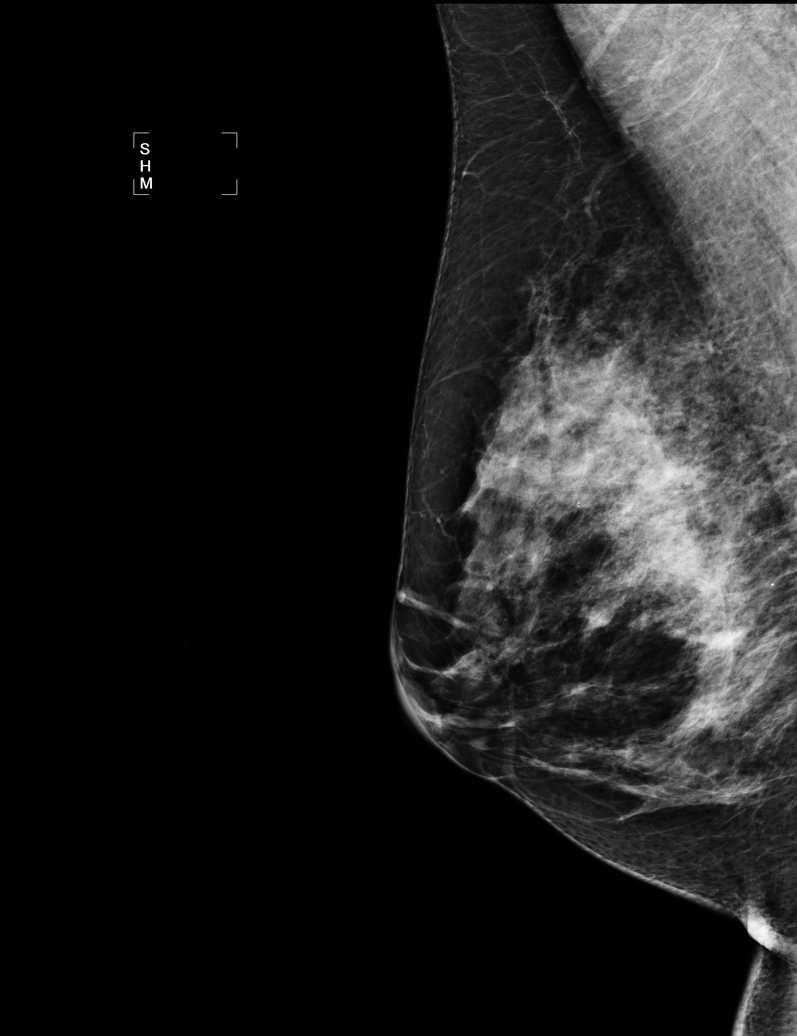

[L TAN]
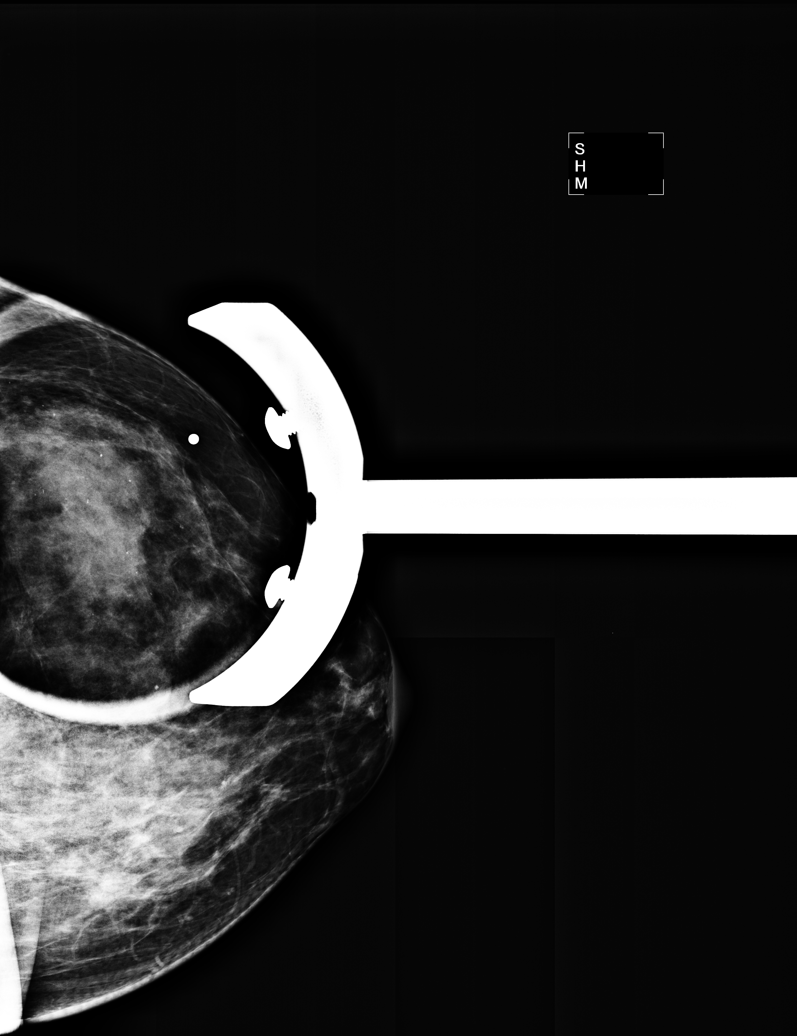

[5 of 5 positions shown; findings below may reference images not displayed]

FINDINGS: The dense tissue pattern is symmetric and stable.  There
are no dominant masses, suspicious calcifications, or areas of
architectural distortion bilaterally.

On physical exam, I cannot palpate a discrete lump within the
lateral left breast today where the patient is tender.

Ultrasound is performed, showing a dense ridge of normal-appearing
fibroglandular tissue and three simple cysts, the largest of which
measures 8 mm.  No suspicious solid masses or shadowing are
present.
IMPRESSION: There is no radiographic evidence of malignancy bilaterally.
Screening mammogram in 1 year is recommended.

BI-RADS CATEGORY 2:  Benign finding(s).

## 2010-06-13 ENCOUNTER — Encounter: Payer: Self-pay | Admitting: Family Medicine

## 2010-06-14 ENCOUNTER — Encounter: Payer: Self-pay | Admitting: Family Medicine

## 2011-01-08 ENCOUNTER — Other Ambulatory Visit: Payer: Self-pay | Admitting: Family Medicine

## 2011-01-08 DIAGNOSIS — Z1231 Encounter for screening mammogram for malignant neoplasm of breast: Secondary | ICD-10-CM

## 2011-02-10 ENCOUNTER — Ambulatory Visit
Admission: RE | Admit: 2011-02-10 | Discharge: 2011-02-10 | Disposition: A | Discharge status: Home / Self Care | Payer: BC Managed Care – PPO | Source: Ambulatory Visit | Attending: Family Medicine | Admitting: Family Medicine

## 2011-02-10 DIAGNOSIS — Z1231 Encounter for screening mammogram for malignant neoplasm of breast: Secondary | ICD-10-CM

## 2011-02-10 IMAGING — MG MM DIGITAL SCREENING {[facility]}
1 series · 1 of 1 positions shown · non-contrast
Comparison: none

DG SCREEN MAMMOGRAM BILATERAL
Bilateral CC and MLO view(s) were taken.
Technologist: MANIUS, RT, RM

DIGITAL SCREENING MAMMOGRAM WITH CAD:
The breast tissue is extremely dense.  No masses or malignant type calcifications are identified.  
Compared with prior studies.
Images were processed with CAD.

[L MLO]
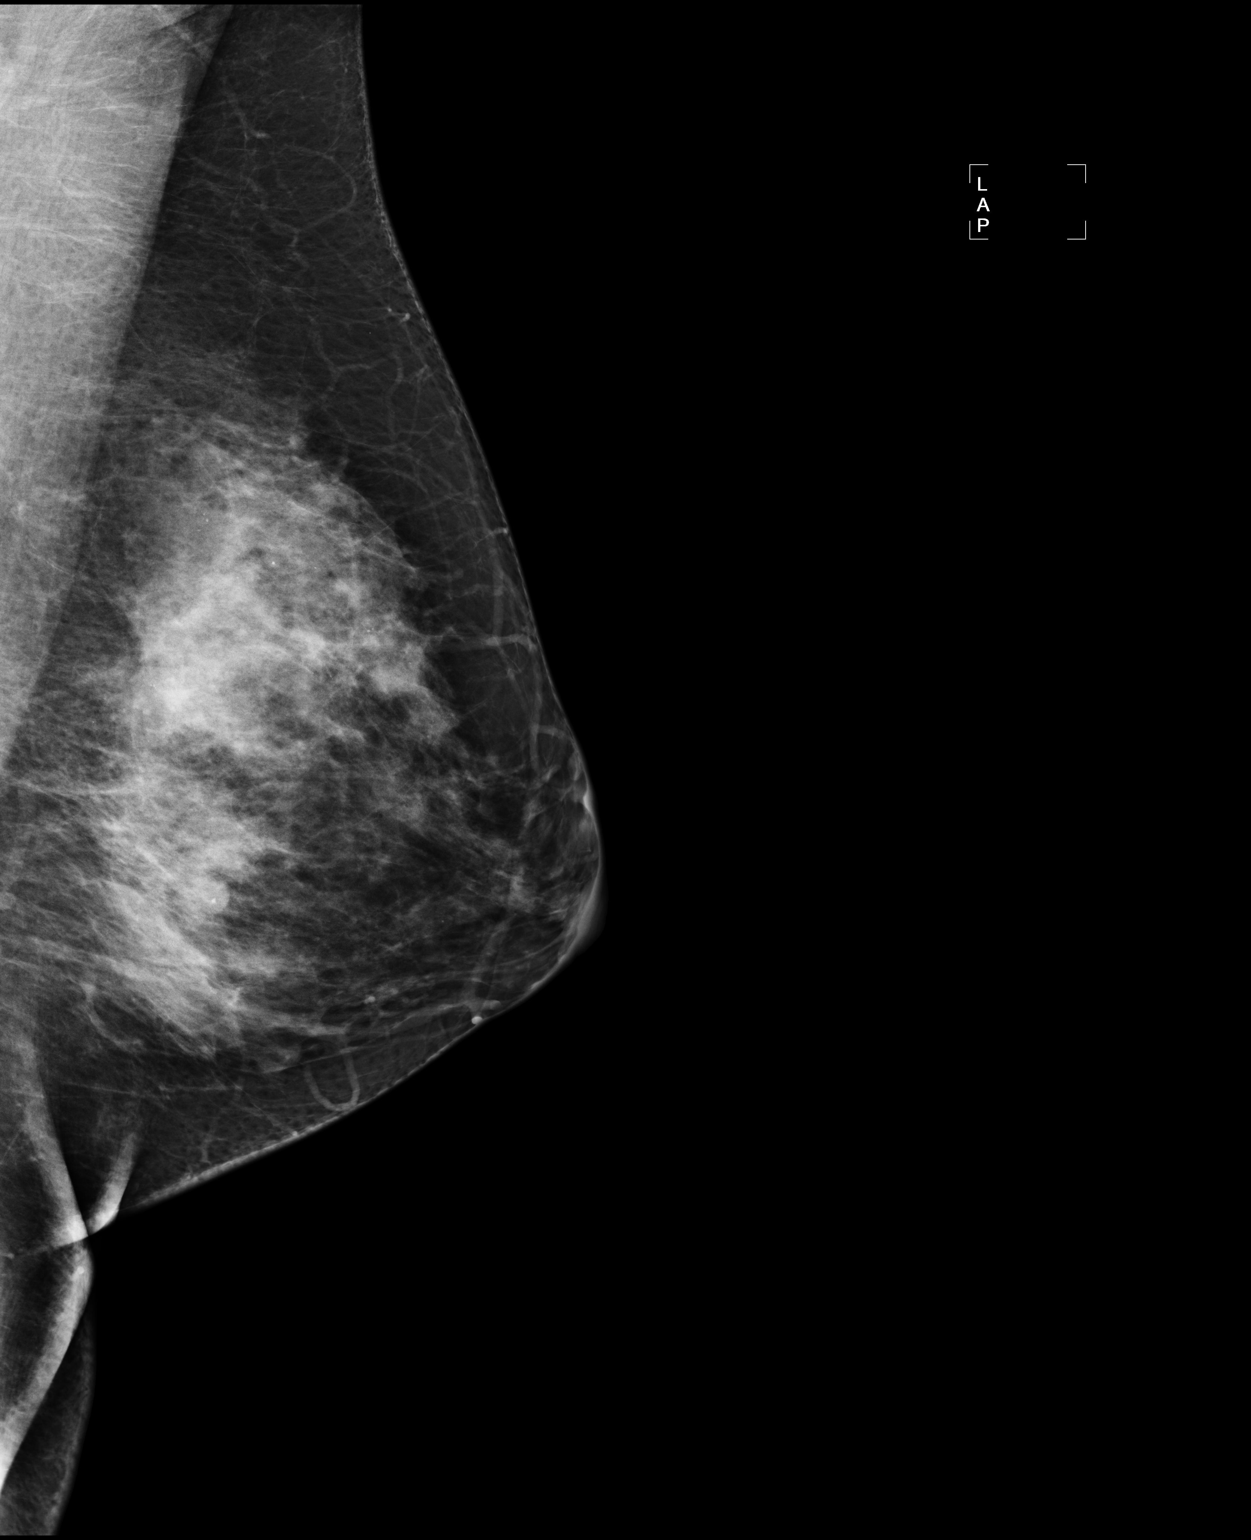

[1 of 1 positions shown; findings below may reference images not displayed]

IMPRESSION: No specific mammographic evidence of malignancy.  Next screening mammogram is recommended in one 
year.

A result letter of this screening mammogram will be mailed directly to the patient.

ASSESSMENT: Negative - BI-RADS 1

Screening mammogram in 1 year.
,

## 2013-01-23 ENCOUNTER — Other Ambulatory Visit: Payer: Self-pay

## 2013-01-23 DIAGNOSIS — Z1231 Encounter for screening mammogram for malignant neoplasm of breast: Secondary | ICD-10-CM

## 2013-01-26 ENCOUNTER — Ambulatory Visit
Admission: RE | Admit: 2013-01-26 | Discharge: 2013-01-26 | Disposition: A | Discharge status: Home / Self Care | Payer: BC Managed Care – PPO | Source: Ambulatory Visit

## 2013-01-26 DIAGNOSIS — Z1231 Encounter for screening mammogram for malignant neoplasm of breast: Secondary | ICD-10-CM

## 2013-01-26 IMAGING — MG STANDARD SCREENING - COMBO
6 of 9 series · 6 of 25 positions shown · non-contrast
Comparison: Previous exam(s).

CLINICAL DATA: Screening.

DIGITAL SCREENING BILATERAL MAMMOGRAM WITH CAD
 DIGITAL BREAST TOMOSYNTHESIS
Digital breast tomosynthesis images are acquired in two
projections.  These images are reviewed in combination with the
digital mammogram, confirming the findings below.

[R MLO (1 of 2)]
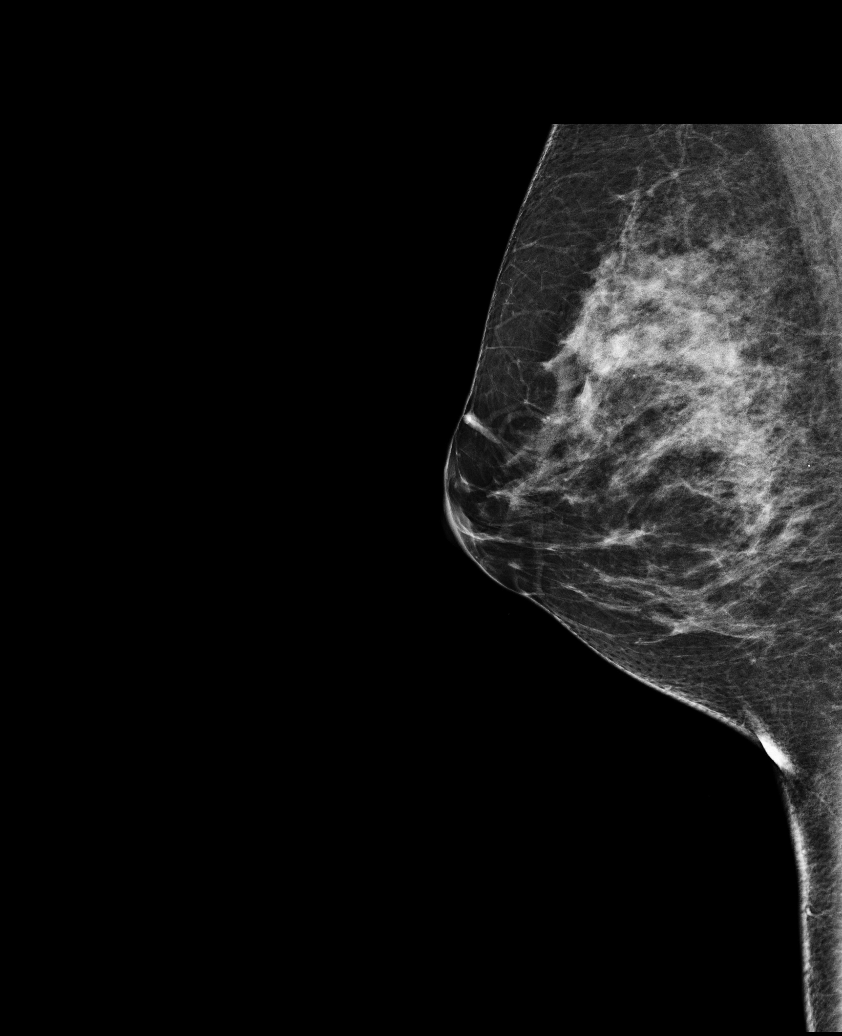

[R CC]
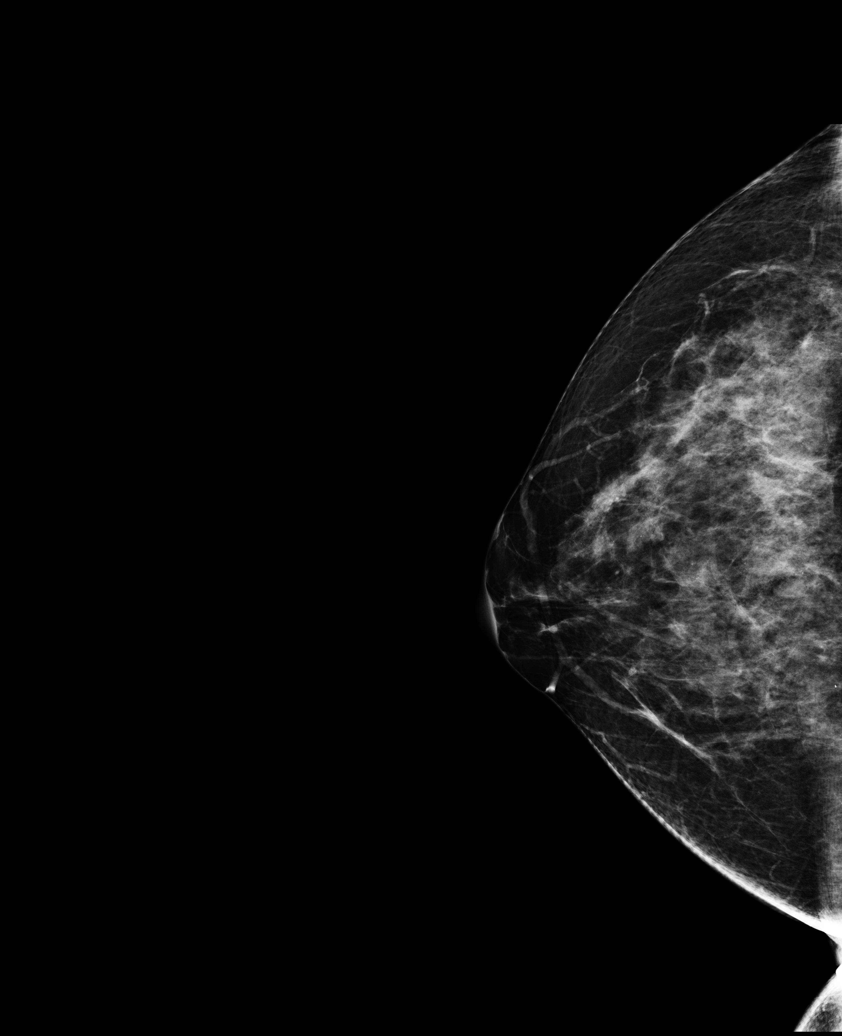

[R MLO (2 of 2)]
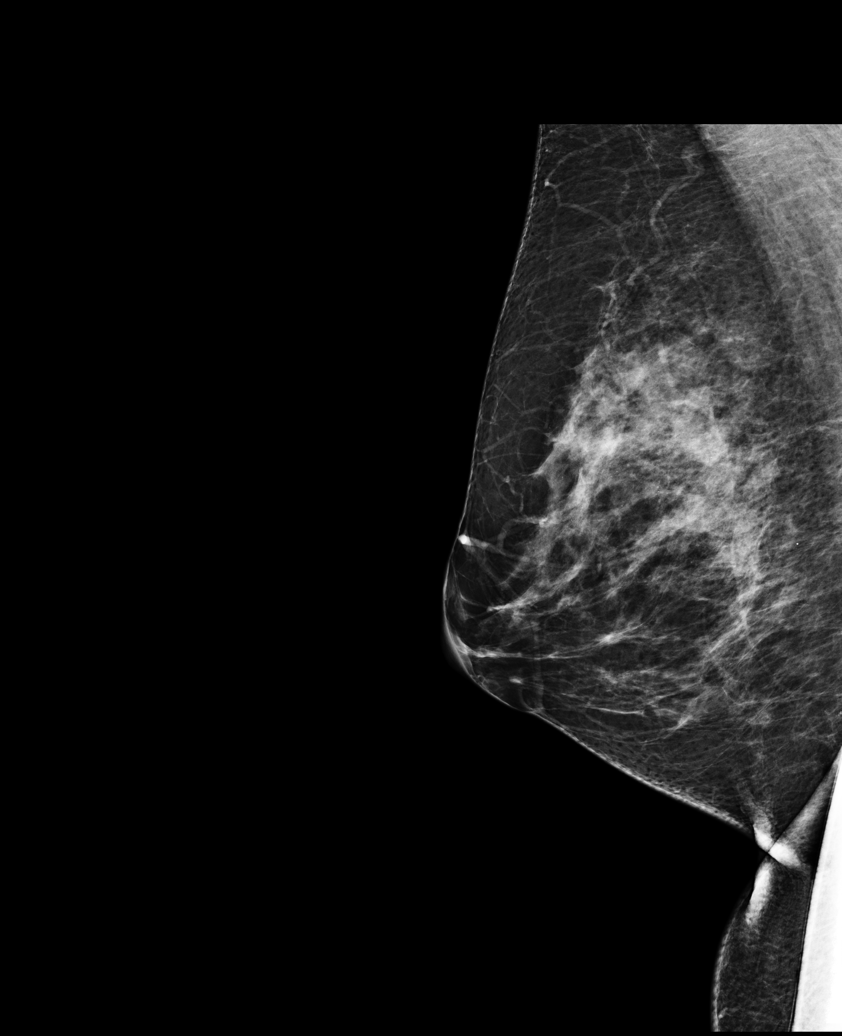

[L MLO]
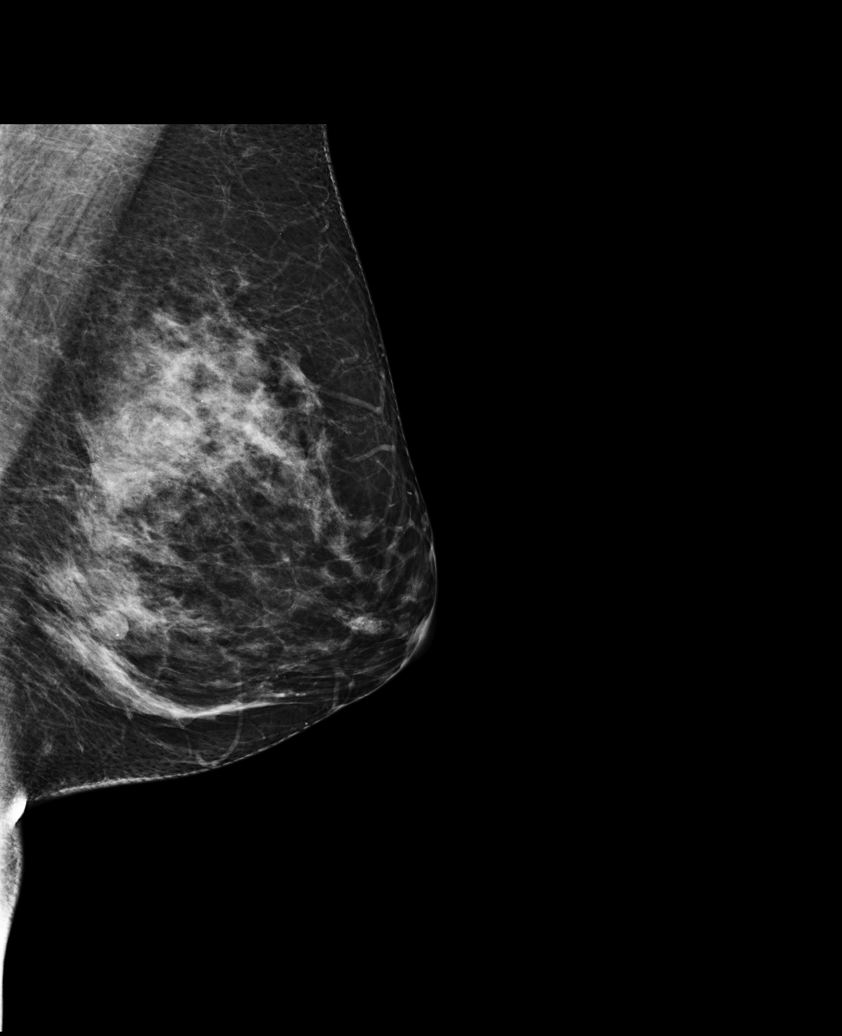

[L CC]
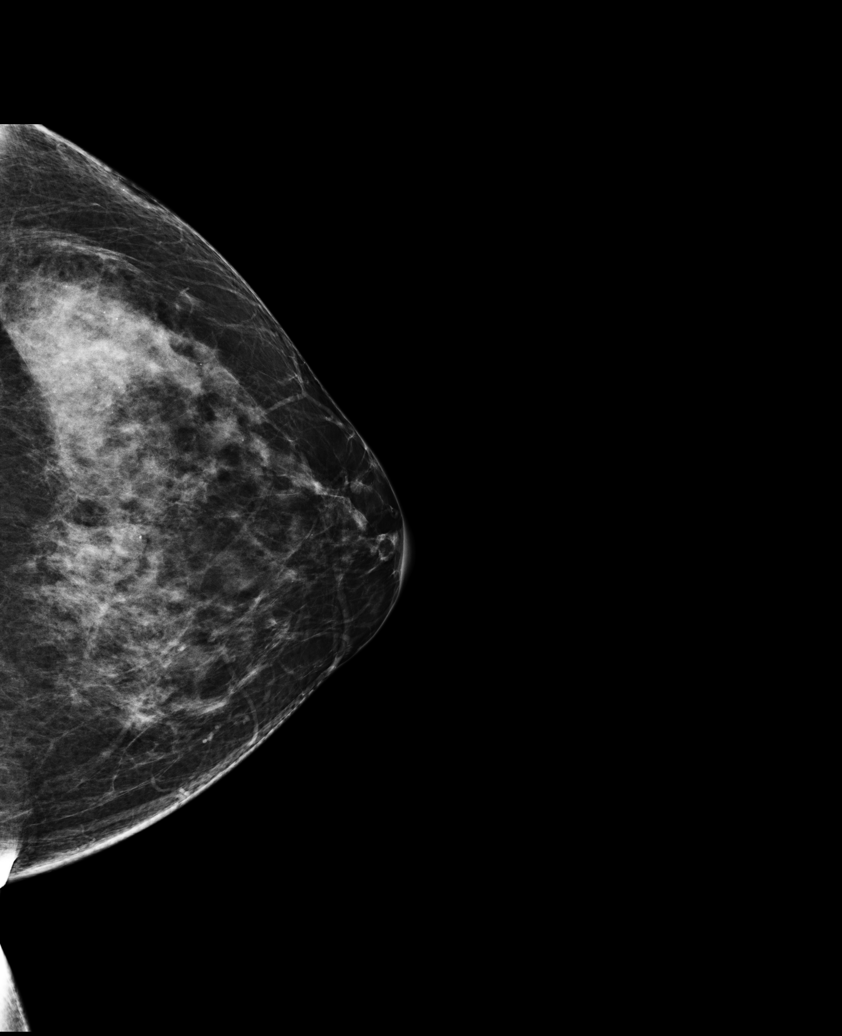

[R CC tomo · tomo slice 38/75.0]
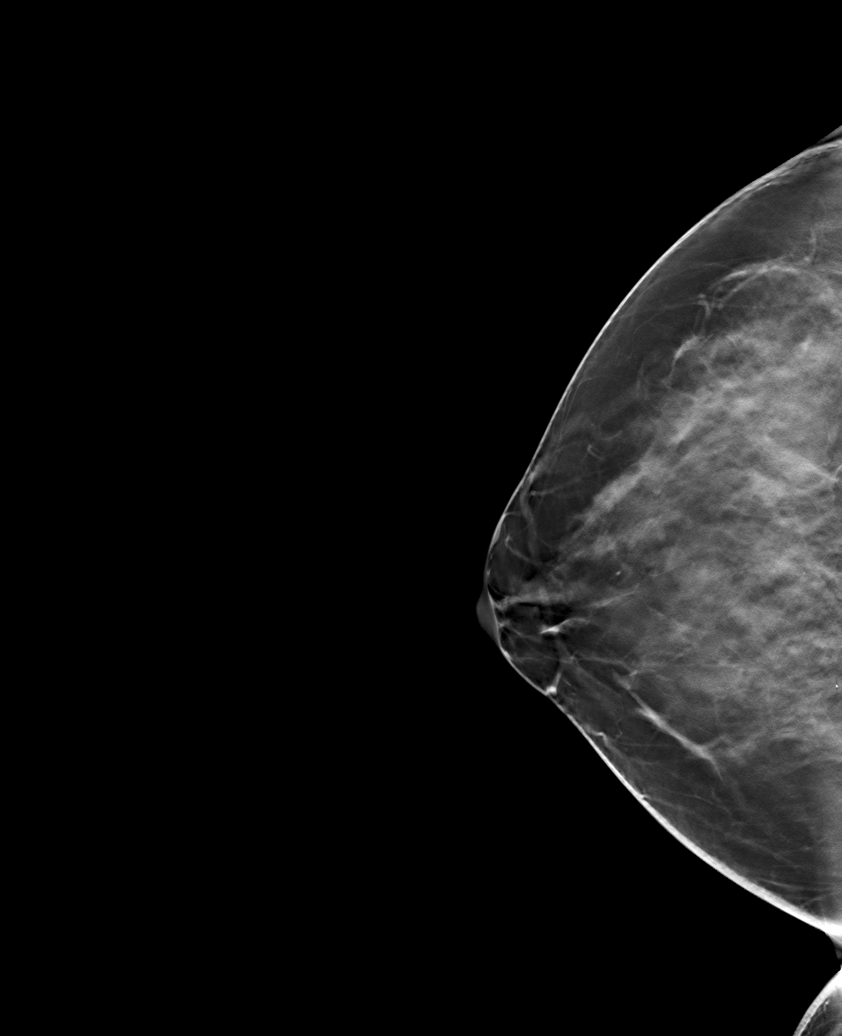

[6 of 25 positions shown; findings below may reference images not displayed]

FINDINGS: ACR Breast Density Category c:  The breast tissue is
heterogeneously dense, which may obscure small masses.

There are no findings suspicious for malignancy. There are multiple
bilateral round and oval circumscribed masses all less than or
equal to one centimeter in size.

Images were processed with CAD.
IMPRESSION: No mammographic evidence of malignancy.

A result letter of this screening mammogram will be mailed directly
to the patient.

RECOMMENDATION:
Screening mammogram in one year. (Code:[I3])

BI-RADS CATEGORY 2:  Benign finding(s).

## 2015-12-19 ENCOUNTER — Other Ambulatory Visit: Payer: Self-pay | Admitting: Family Medicine

## 2015-12-19 DIAGNOSIS — Z1231 Encounter for screening mammogram for malignant neoplasm of breast: Secondary | ICD-10-CM

## 2015-12-24 ENCOUNTER — Ambulatory Visit
Admission: RE | Admit: 2015-12-24 | Discharge: 2015-12-24 | Disposition: A | Discharge status: Home / Self Care | Payer: BC Managed Care – PPO | Source: Ambulatory Visit | Attending: Family Medicine | Admitting: Family Medicine

## 2015-12-24 ENCOUNTER — Encounter: Payer: Self-pay | Admitting: Radiology

## 2015-12-24 DIAGNOSIS — Z1231 Encounter for screening mammogram for malignant neoplasm of breast: Secondary | ICD-10-CM

## 2015-12-24 IMAGING — MG 2D DIGITAL SCREENING BILATERAL MAMMOGRAM WITH CAD AND ADJUNCT TO
8 of 12 series · 8 of 28 positions shown · non-contrast
Comparison: Previous exam(s).

CLINICAL DATA: Screening.

EXAM:
2D DIGITAL SCREENING BILATERAL MAMMOGRAM WITH CAD AND ADJUNCT TOMO

[R CC synth-2D]
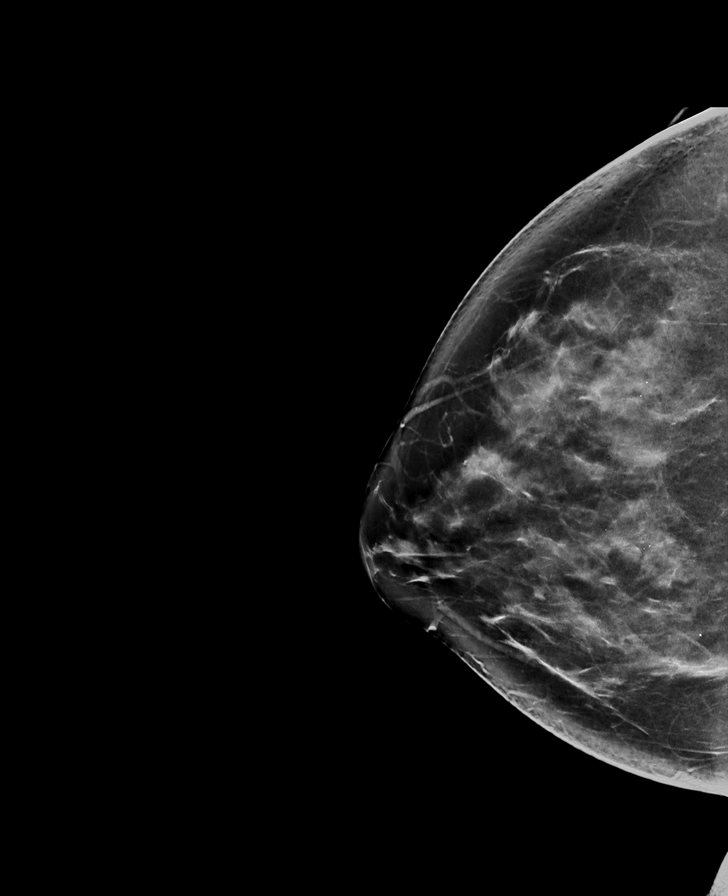

[R MLO synth-2D]
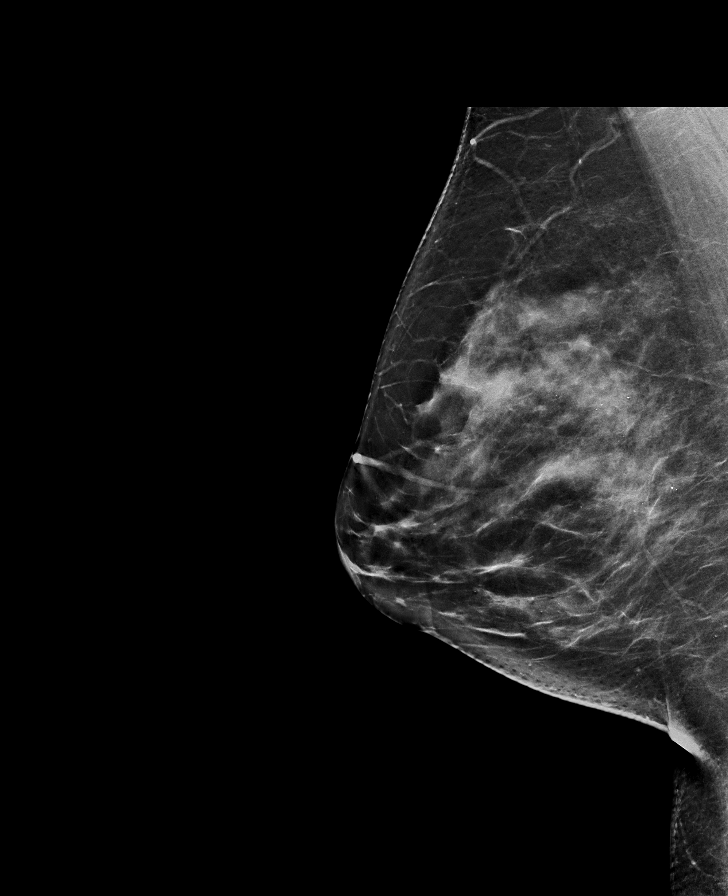

[R CC]
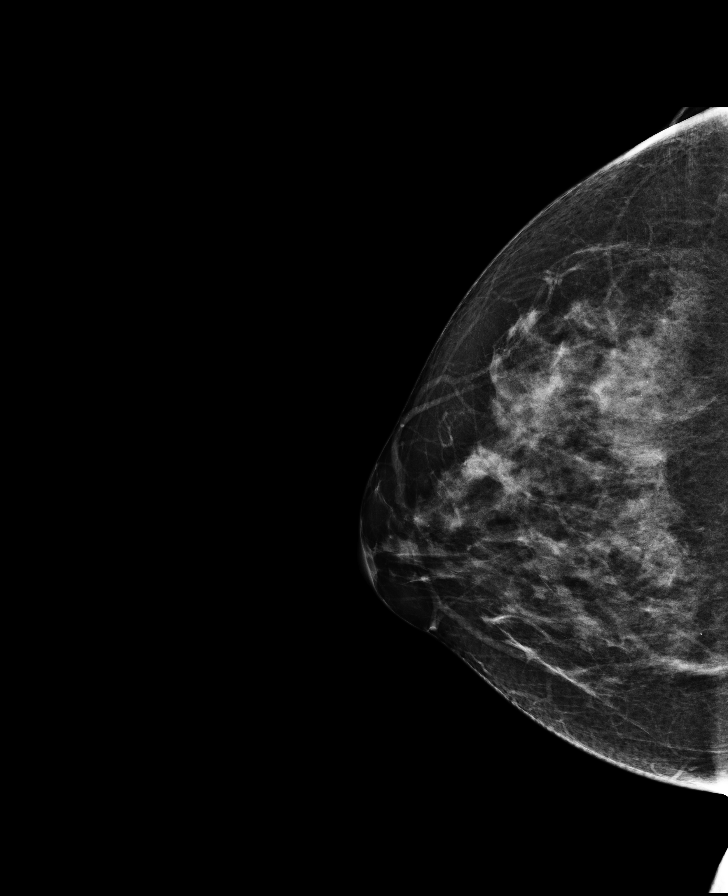

[L CC synth-2D]
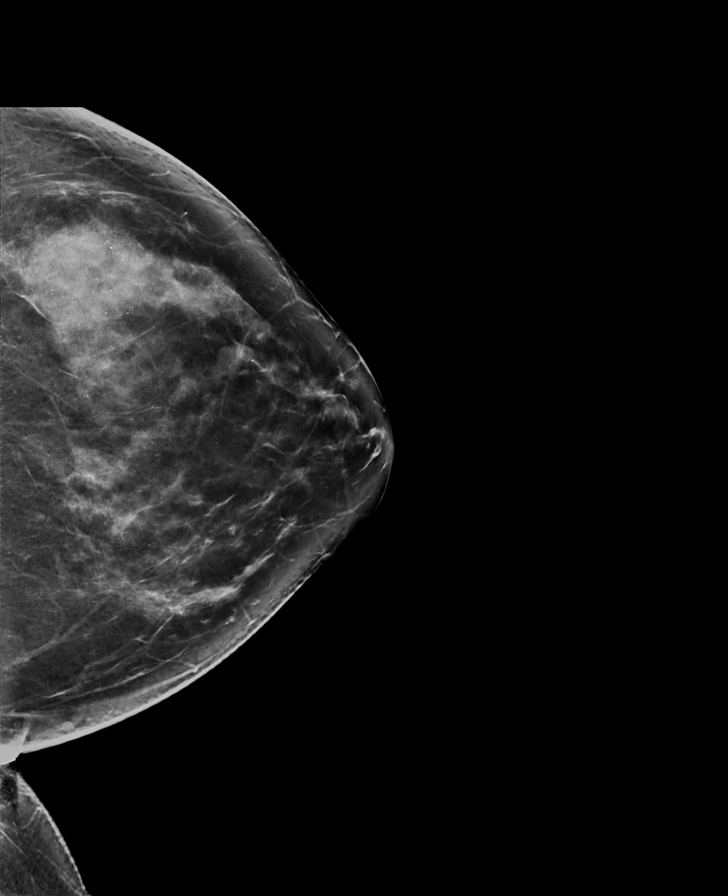

[L CC]
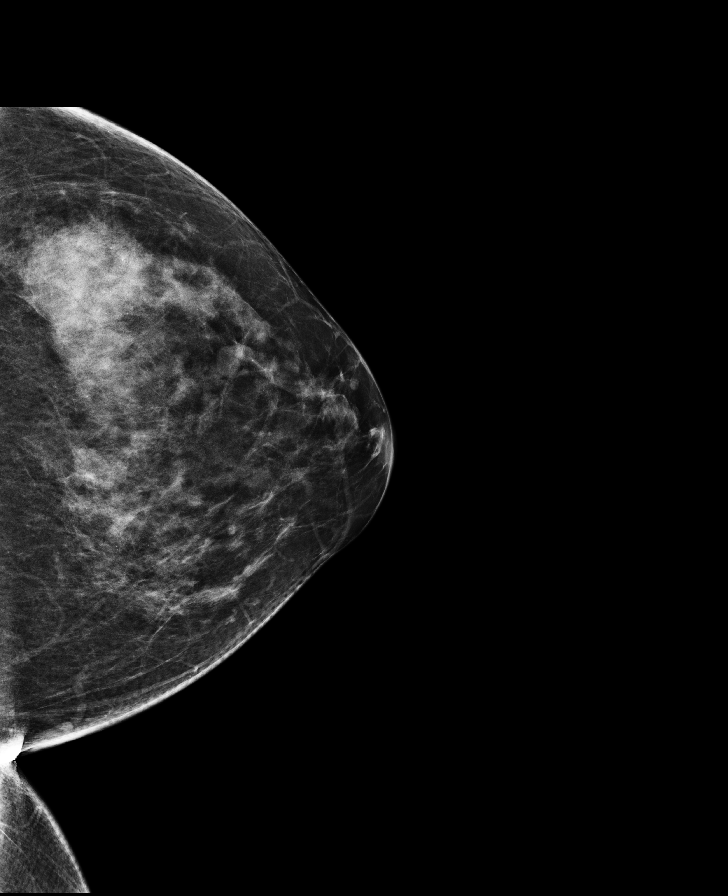

[R MLO]
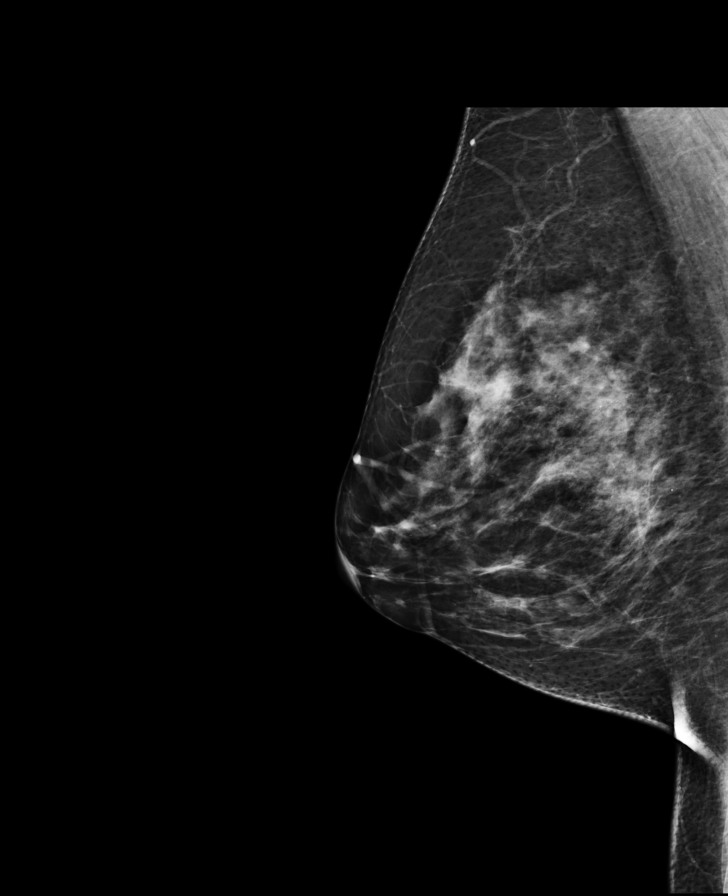

[L MLO synth-2D]
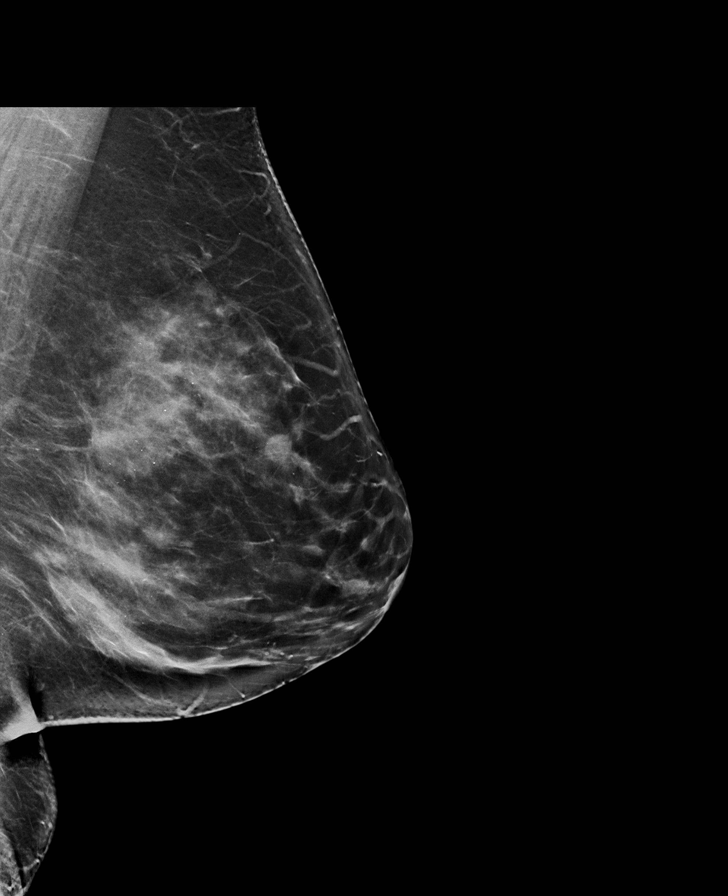

[L MLO]
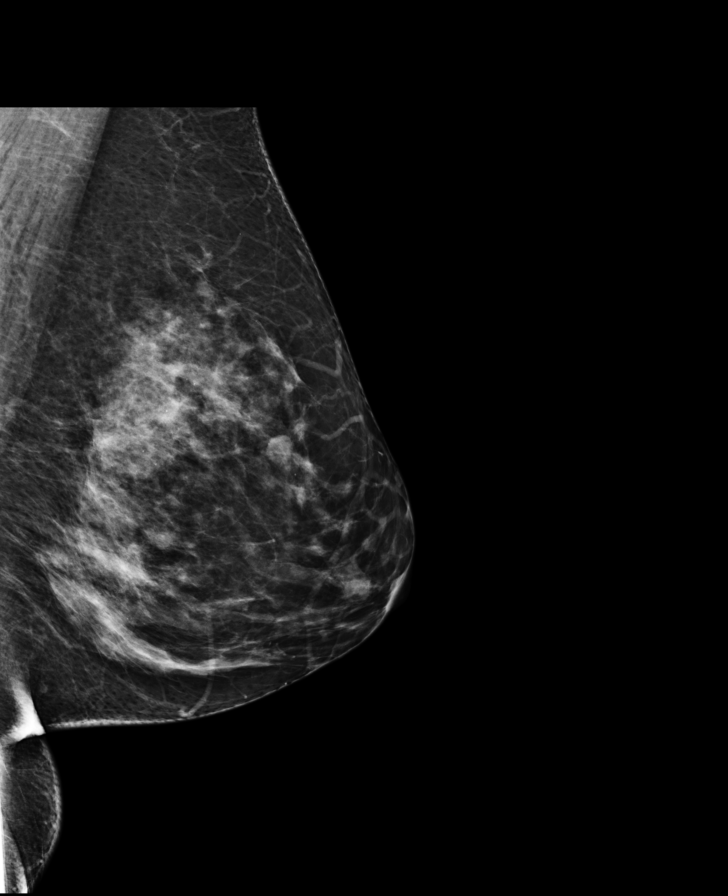

[8 of 28 positions shown; findings below may reference images not displayed]

ACR Breast Density Category c: The breast tissue is heterogeneously
dense, which may obscure small masses.
FINDINGS: There are no findings suspicious for malignancy. Images were
processed with CAD.
IMPRESSION: No mammographic evidence of malignancy. A result letter of this
screening mammogram will be mailed directly to the patient.

RECOMMENDATION:
Screening mammogram in one year. (Code:[TA])

BI-RADS CATEGORY  1: Negative.

## 2018-02-21 HISTORY — PX: BREAST BIOPSY: SHX20

## 2018-02-28 ENCOUNTER — Other Ambulatory Visit: Payer: Self-pay | Admitting: Family Medicine

## 2018-02-28 DIAGNOSIS — Z1231 Encounter for screening mammogram for malignant neoplasm of breast: Secondary | ICD-10-CM

## 2018-03-07 ENCOUNTER — Ambulatory Visit
Admission: RE | Admit: 2018-03-07 | Discharge: 2018-03-07 | Disposition: A | Discharge status: Home / Self Care | Payer: BC Managed Care – PPO | Source: Ambulatory Visit | Attending: Family Medicine | Admitting: Family Medicine

## 2018-03-07 DIAGNOSIS — Z1231 Encounter for screening mammogram for malignant neoplasm of breast: Secondary | ICD-10-CM

## 2018-03-07 IMAGING — MG DIGITAL SCREENING BILATERAL MAMMOGRAM WITH TOMO AND CAD
8 series · 8 of 24 positions shown · non-contrast
Comparison: Previous exam(s).

CLINICAL DATA: Screening.

EXAM:
DIGITAL SCREENING BILATERAL MAMMOGRAM WITH TOMO AND CAD

[R MLO synth-2D]
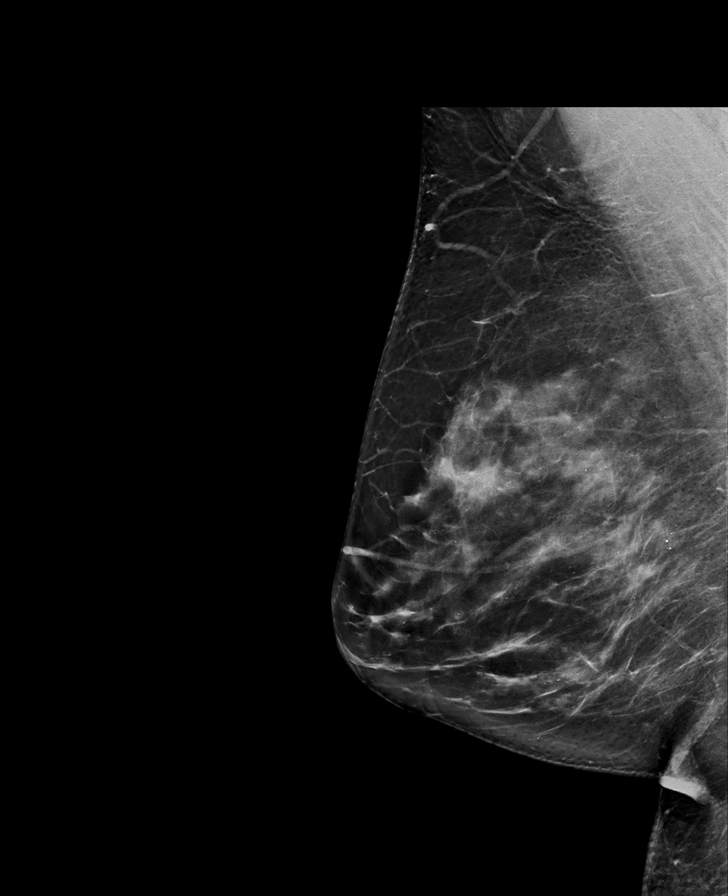

[R CC synth-2D]
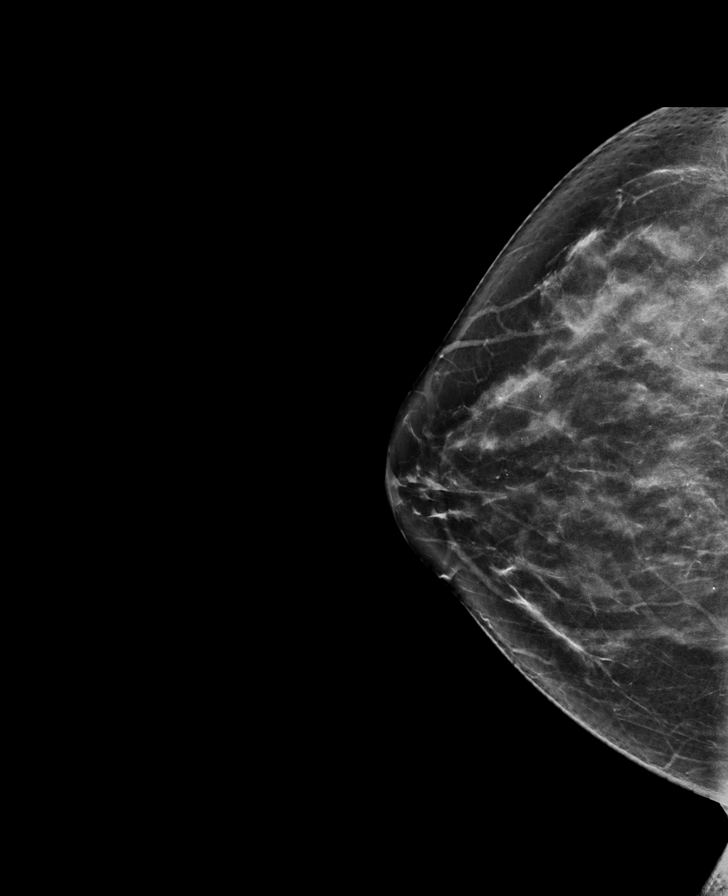

[L MLO synth-2D]
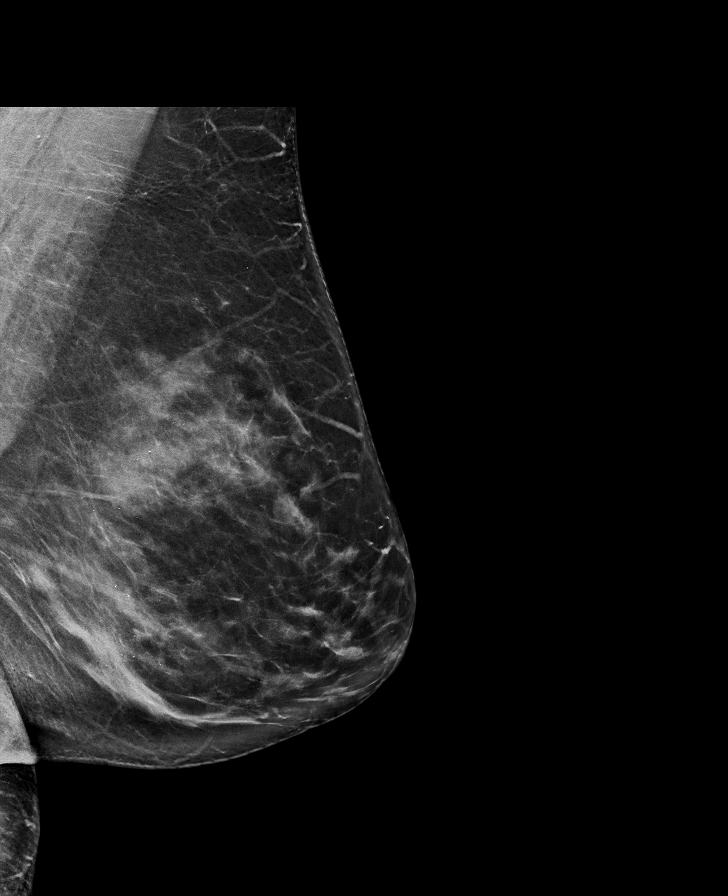

[L CC synth-2D]
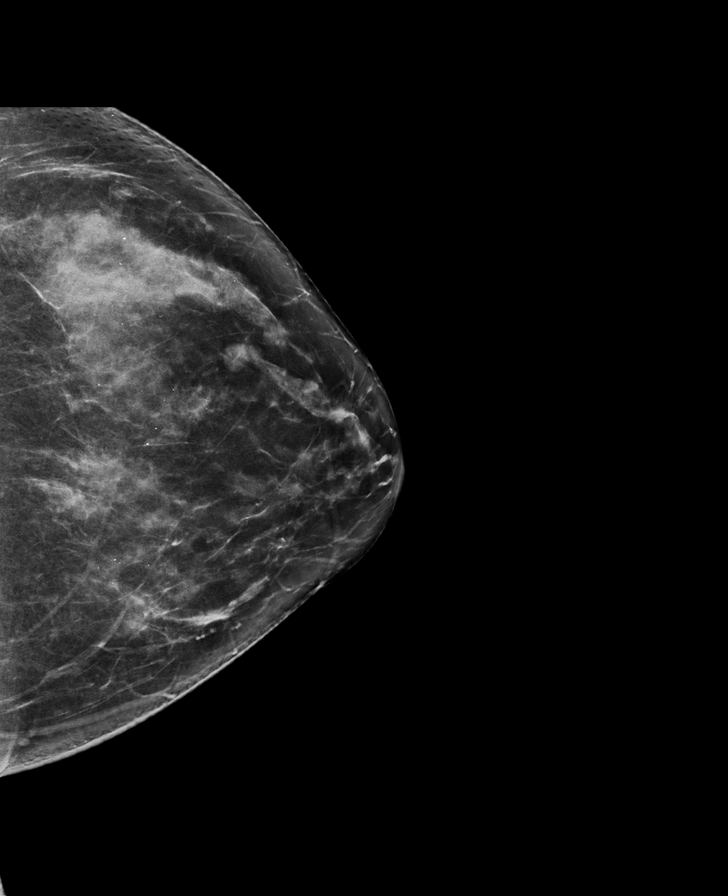

[R CC tomo · tomo slice 37/73.0]
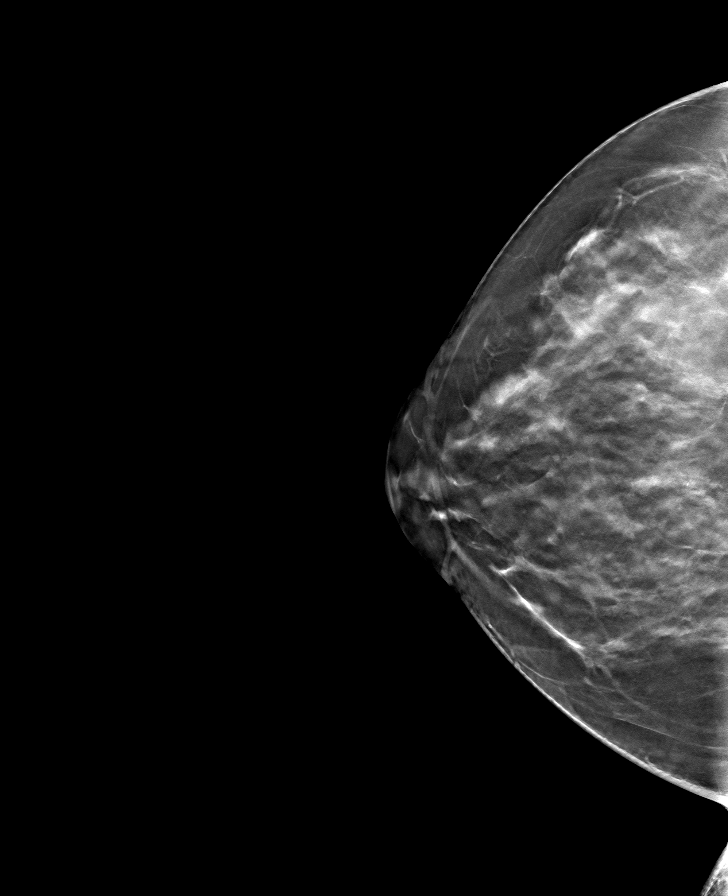

[L CC tomo · tomo slice 37/74.0]
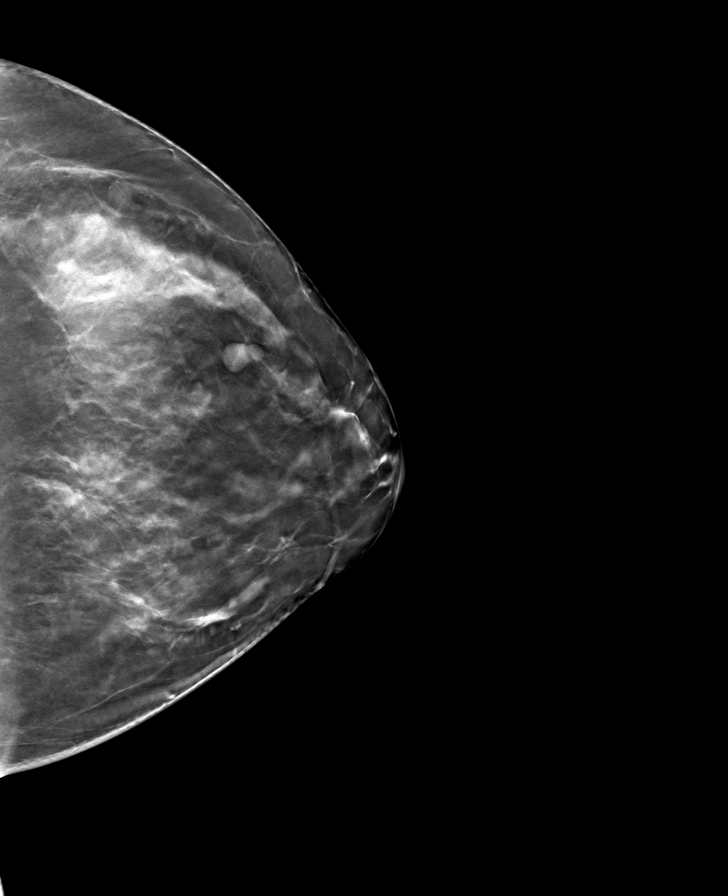

[R MLO tomo · tomo slice 43/86.0]
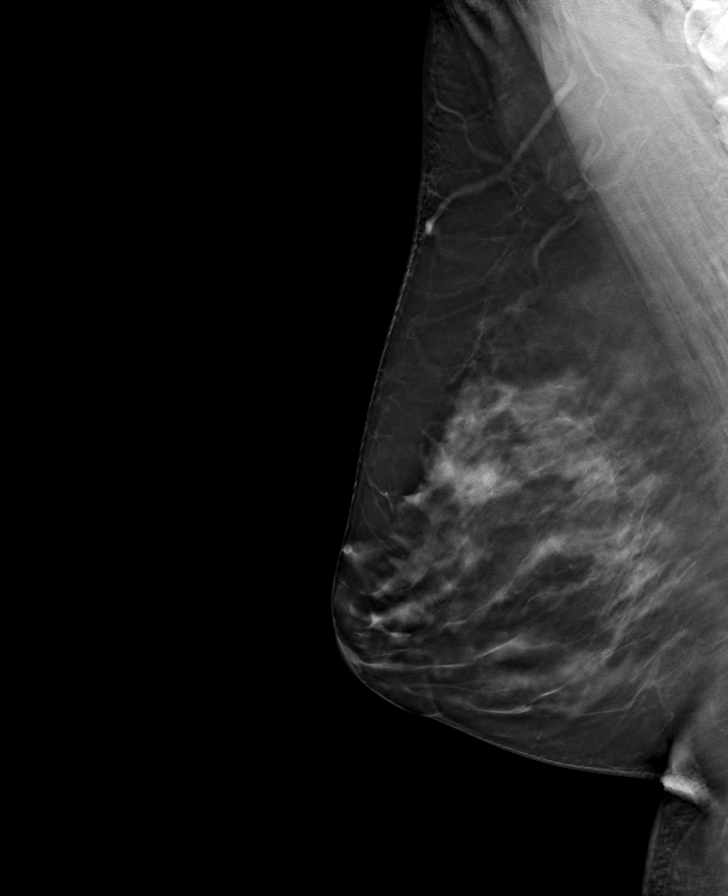

[L MLO tomo · tomo slice 41/82.0]
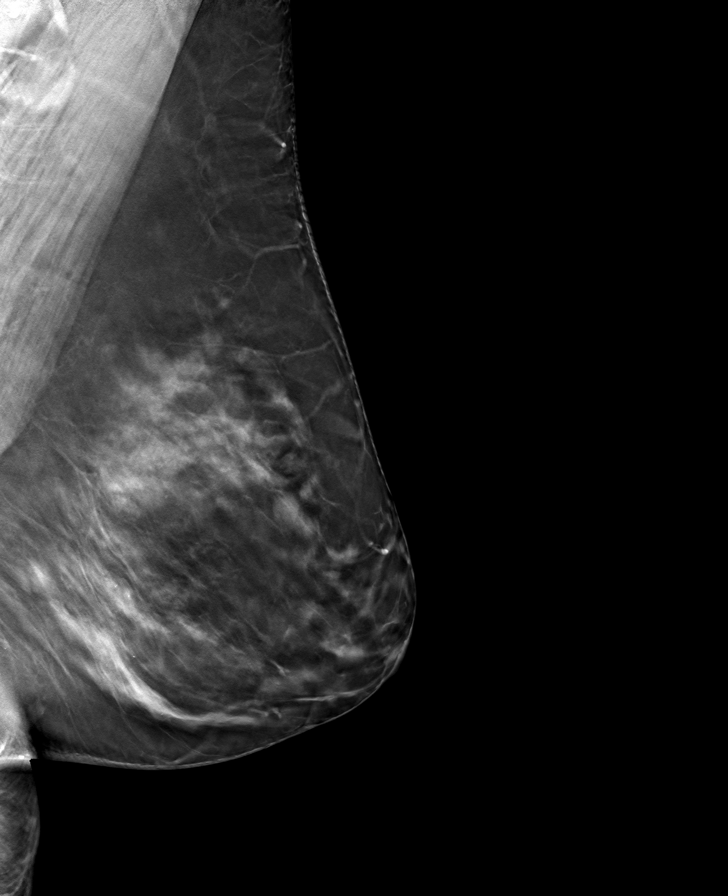

[8 of 24 positions shown; findings below may reference images not displayed]

ACR Breast Density Category c: The breast tissue is heterogeneously
dense, which may obscure small masses.
FINDINGS: In the left breast, possible distortion warrants further evaluation.
In the right breast, no findings suspicious for malignancy. Images
were processed with CAD.
IMPRESSION: Further evaluation is suggested for possible distortion in the left
breast.

RECOMMENDATION:
Diagnostic mammogram and possibly ultrasound of the left breast.
(Code:[JM])

The patient will be contacted regarding the findings, and additional
imaging will be scheduled.

BI-RADS CATEGORY  0: Incomplete. Need additional imaging evaluation
and/or prior mammograms for comparison.

## 2018-03-09 ENCOUNTER — Other Ambulatory Visit: Payer: Self-pay | Admitting: Family Medicine

## 2018-03-09 DIAGNOSIS — R928 Other abnormal and inconclusive findings on diagnostic imaging of breast: Secondary | ICD-10-CM

## 2018-03-14 ENCOUNTER — Ambulatory Visit
Admission: RE | Admit: 2018-03-14 | Discharge: 2018-03-14 | Disposition: A | Discharge status: Home / Self Care | Payer: BC Managed Care – PPO | Source: Ambulatory Visit | Attending: Family Medicine | Admitting: Family Medicine

## 2018-03-14 ENCOUNTER — Other Ambulatory Visit: Payer: Self-pay | Admitting: Family Medicine

## 2018-03-14 DIAGNOSIS — R928 Other abnormal and inconclusive findings on diagnostic imaging of breast: Secondary | ICD-10-CM

## 2018-03-14 DIAGNOSIS — N6489 Other specified disorders of breast: Secondary | ICD-10-CM

## 2018-03-14 IMAGING — MG DIGITAL DIAGNOSTIC UNILATERAL LEFT MAMMOGRAM WITH TOMO AND CAD
6 series · 6 of 18 positions shown · non-contrast
Comparison: Previous exam(s).

CLINICAL DATA: Possible distortion in the slightly inner left
breast, posterior depth, seen on most recent screening mammogram.

EXAM:
DIGITAL DIAGNOSTIC LEFT MAMMOGRAM WITH CAD AND TOMO
ULTRASOUND LEFT BREAST

[L CC synth-2D (1 of 2)]
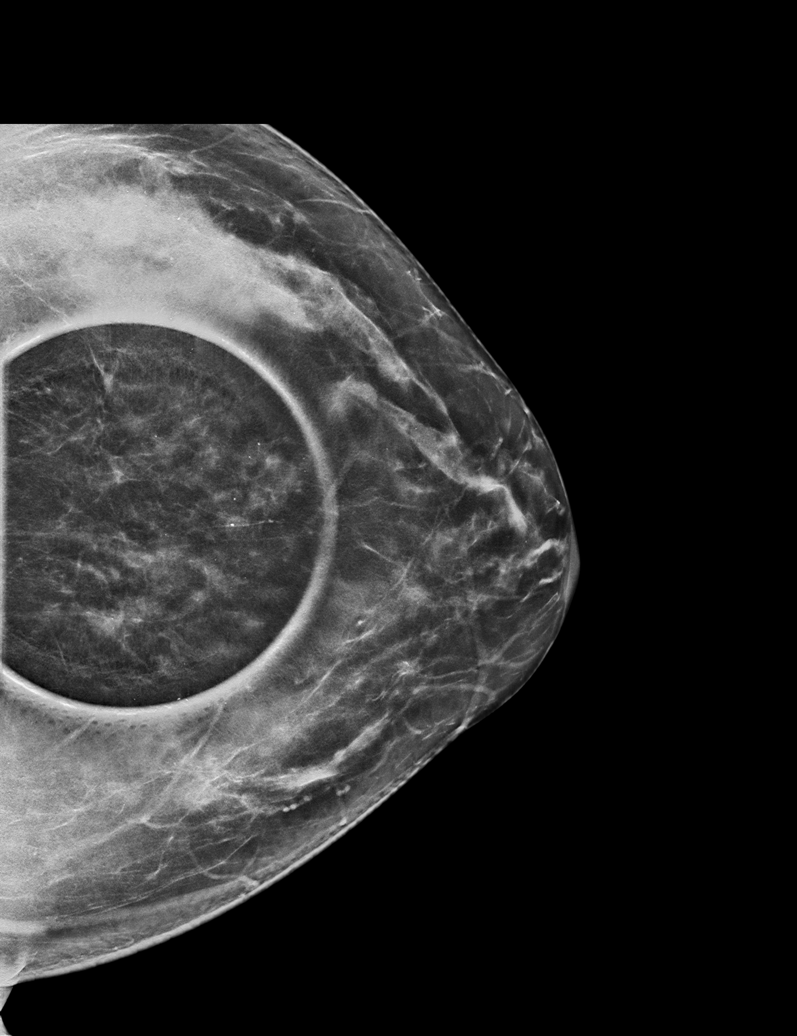

[L CC synth-2D (2 of 2)]
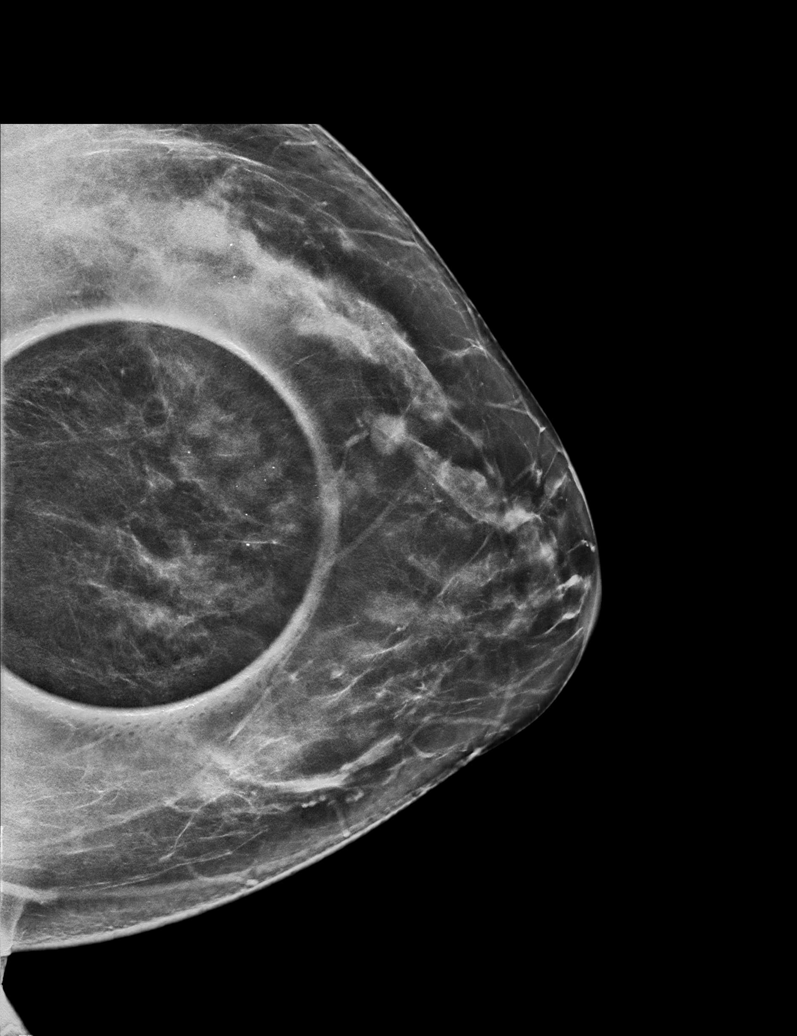

[L ML synth-2D]
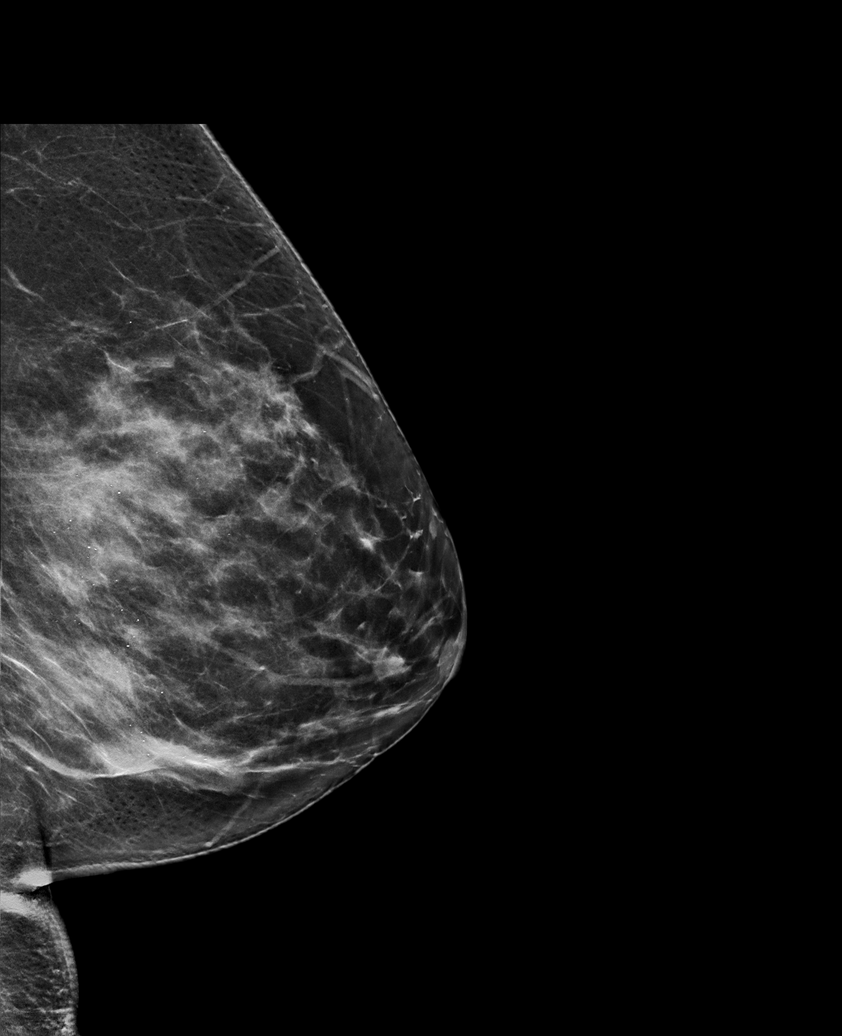

[L ML tomo · tomo slice 37/74.0]
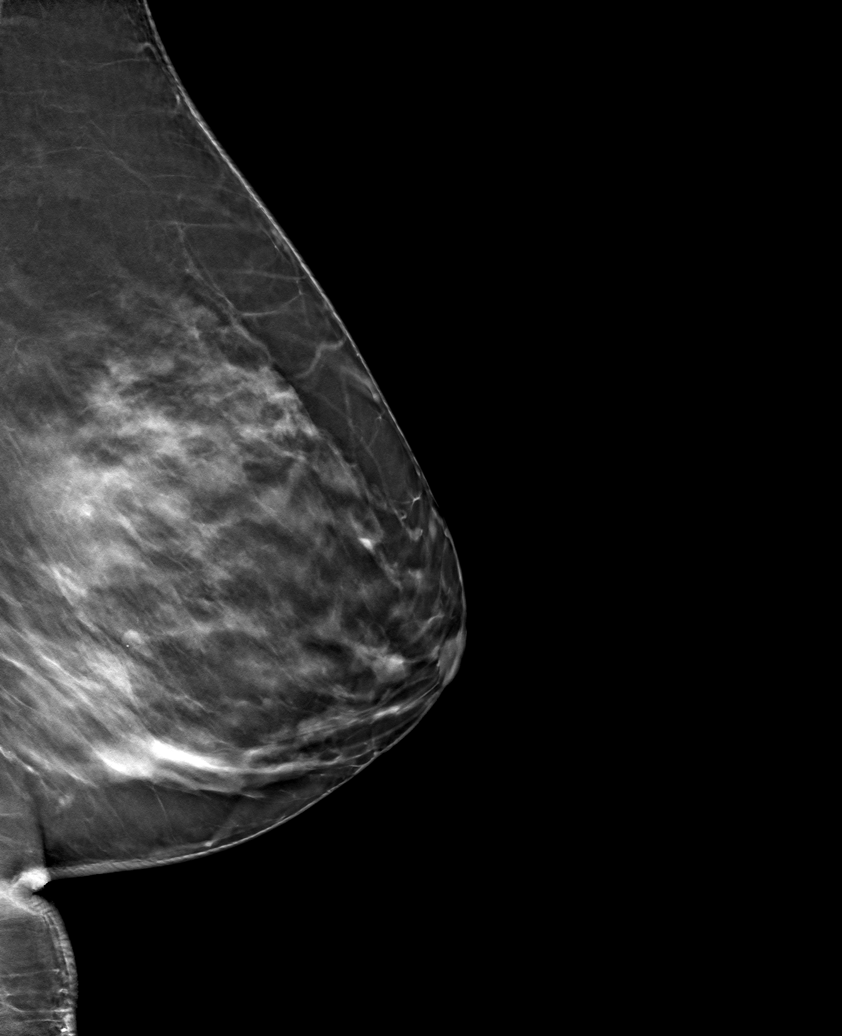

[L CC tomo (1 of 2) · tomo slice 35/69.0]
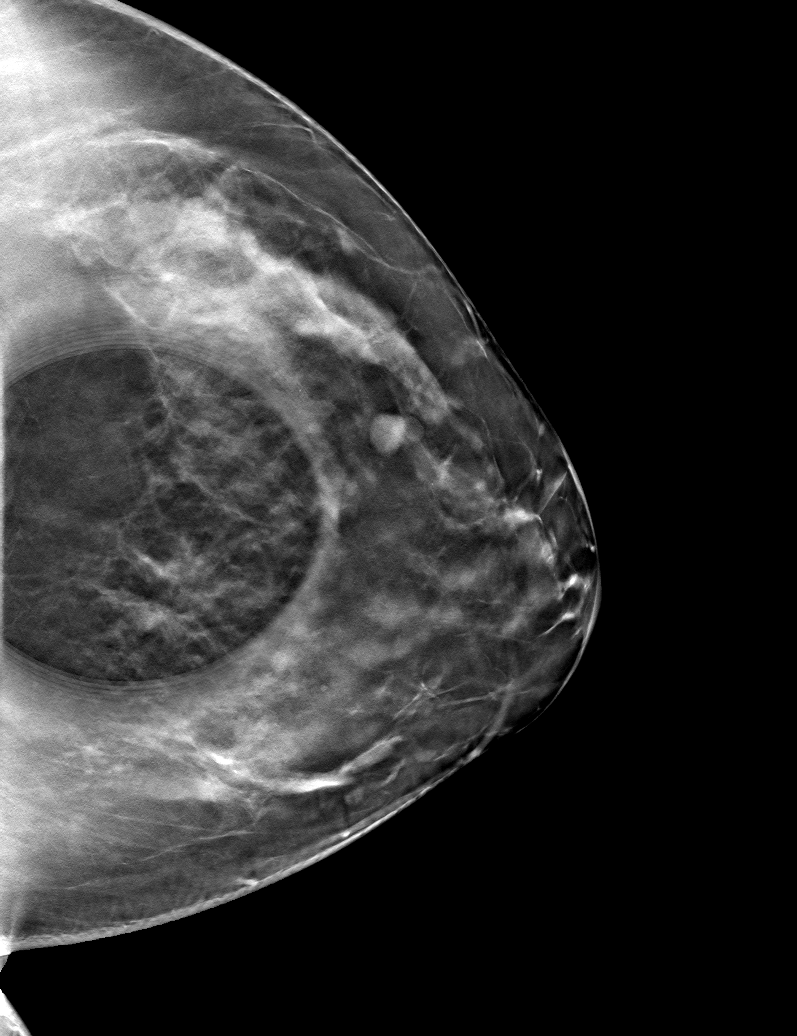

[L CC tomo (2 of 2) · tomo slice 33/64.0]
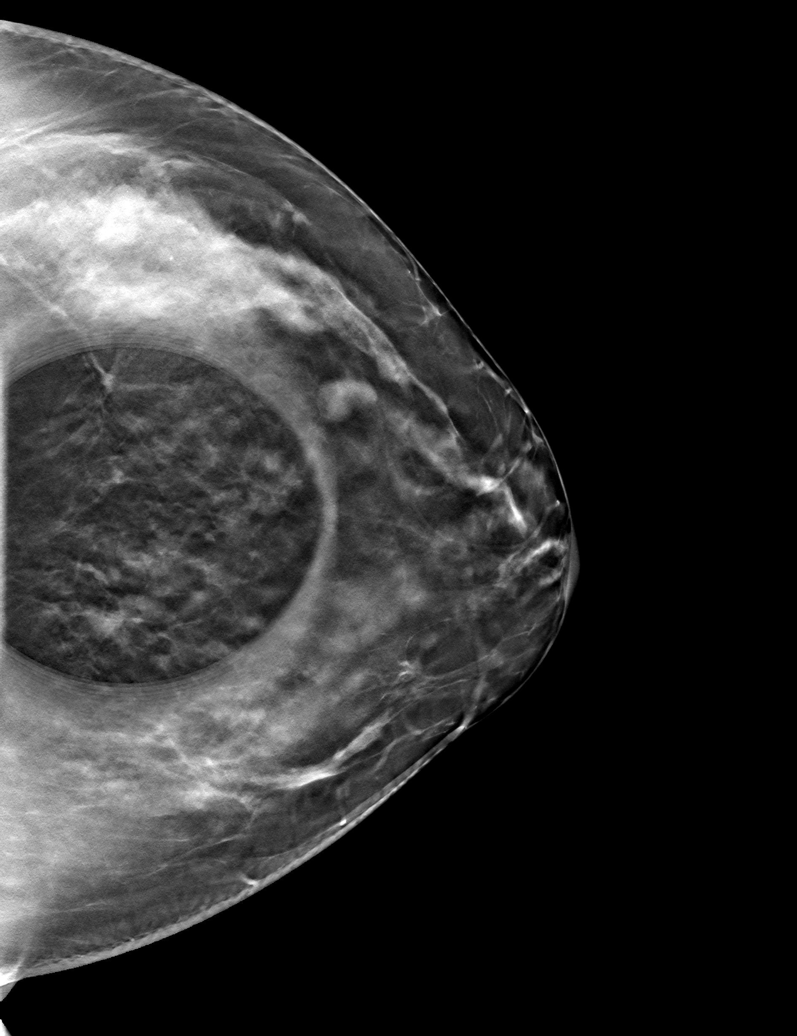

[6 of 18 positions shown; findings below may reference images not displayed]

ACR Breast Density Category c: The breast tissue is heterogeneously
dense, which may obscure small masses.
FINDINGS: Additional mammographic views of the left breast demonstrate no
reproducible distortion in the medial left breast, posterior depth.
There is however an asymmetry with a few associated
microcalcifications in the medial left breast, posterior depth, seen
on both spot compression craniocaudal tomosynthesis views, image
41/69 and image 37/64.

Mammographic images were processed with CAD.

On physical exam, no suspicious masses are palpated.

Targeted ultrasound is performed, showing dense breast parenchyma,
but no sonographically identifiable mass. There is no evidence of
left axillary lymphadenopathy.
IMPRESSION: Left medial breast asymmetry, seen mammographically, for which
stereotactic core needle biopsy is recommended.

RECOMMENDATION:
Stereotactic core needle biopsy of the left breast.

I have discussed the findings and recommendations with the patient.
Results were also provided in writing at the conclusion of the
visit. If applicable, a reminder letter will be sent to the patient
regarding the next appointment.

BI-RADS CATEGORY  4: Suspicious.

## 2018-03-14 IMAGING — US ULTRASOUND LEFT BREAST LIMITED
1 series · 1 of 1 positions shown · non-contrast
Comparison: Previous exam(s).

CLINICAL DATA: Possible distortion in the slightly inner left
breast, posterior depth, seen on most recent screening mammogram.

EXAM:
DIGITAL DIAGNOSTIC LEFT MAMMOGRAM WITH CAD AND TOMO
ULTRASOUND LEFT BREAST

[Series 1: ultrasound left breast limited · 0.07mm/px · 1 of 1 slices shown]
[im 1/1]
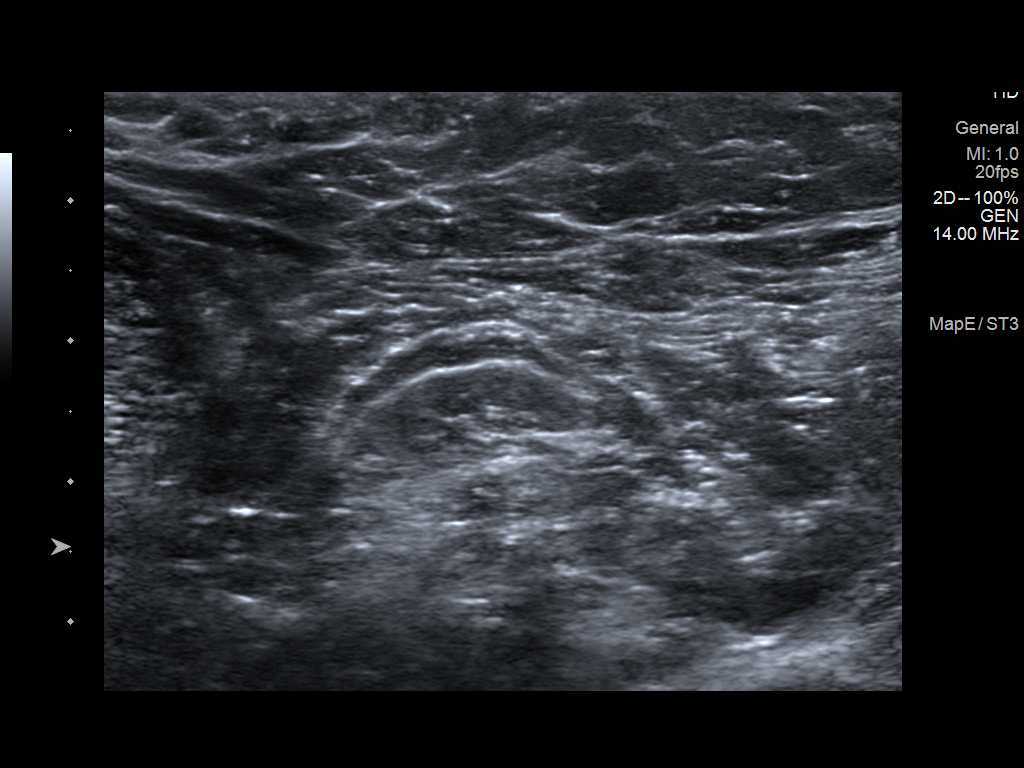

[1 of 1 positions shown; findings below may reference images not displayed]

ACR Breast Density Category c: The breast tissue is heterogeneously
dense, which may obscure small masses.
FINDINGS: Additional mammographic views of the left breast demonstrate no
reproducible distortion in the medial left breast, posterior depth.
There is however an asymmetry with a few associated
microcalcifications in the medial left breast, posterior depth, seen
on both spot compression craniocaudal tomosynthesis views, image
41/69 and image 37/64.

Mammographic images were processed with CAD.

On physical exam, no suspicious masses are palpated.

Targeted ultrasound is performed, showing dense breast parenchyma,
but no sonographically identifiable mass. There is no evidence of
left axillary lymphadenopathy.
IMPRESSION: Left medial breast asymmetry, seen mammographically, for which
stereotactic core needle biopsy is recommended.

RECOMMENDATION:
Stereotactic core needle biopsy of the left breast.

I have discussed the findings and recommendations with the patient.
Results were also provided in writing at the conclusion of the
visit. If applicable, a reminder letter will be sent to the patient
regarding the next appointment.

BI-RADS CATEGORY  4: Suspicious.

## 2018-03-20 ENCOUNTER — Other Ambulatory Visit: Payer: Self-pay | Admitting: Family Medicine

## 2018-03-20 ENCOUNTER — Ambulatory Visit
Admission: RE | Admit: 2018-03-20 | Discharge: 2018-03-20 | Disposition: A | Discharge status: Home / Self Care | Payer: BC Managed Care – PPO | Source: Ambulatory Visit | Attending: Family Medicine | Admitting: Family Medicine

## 2018-03-20 DIAGNOSIS — N6489 Other specified disorders of breast: Secondary | ICD-10-CM

## 2018-03-20 IMAGING — MG MM CLIP PLACEMENT
6 series · 6 of 14 positions shown · non-contrast
Comparison: Previous exam(s).

CLINICAL DATA: Evaluate biopsy clip

EXAM:
DIAGNOSTIC LEFT MAMMOGRAM POST STEREOTACTIC BIOPSY

[L CC synth-2D]
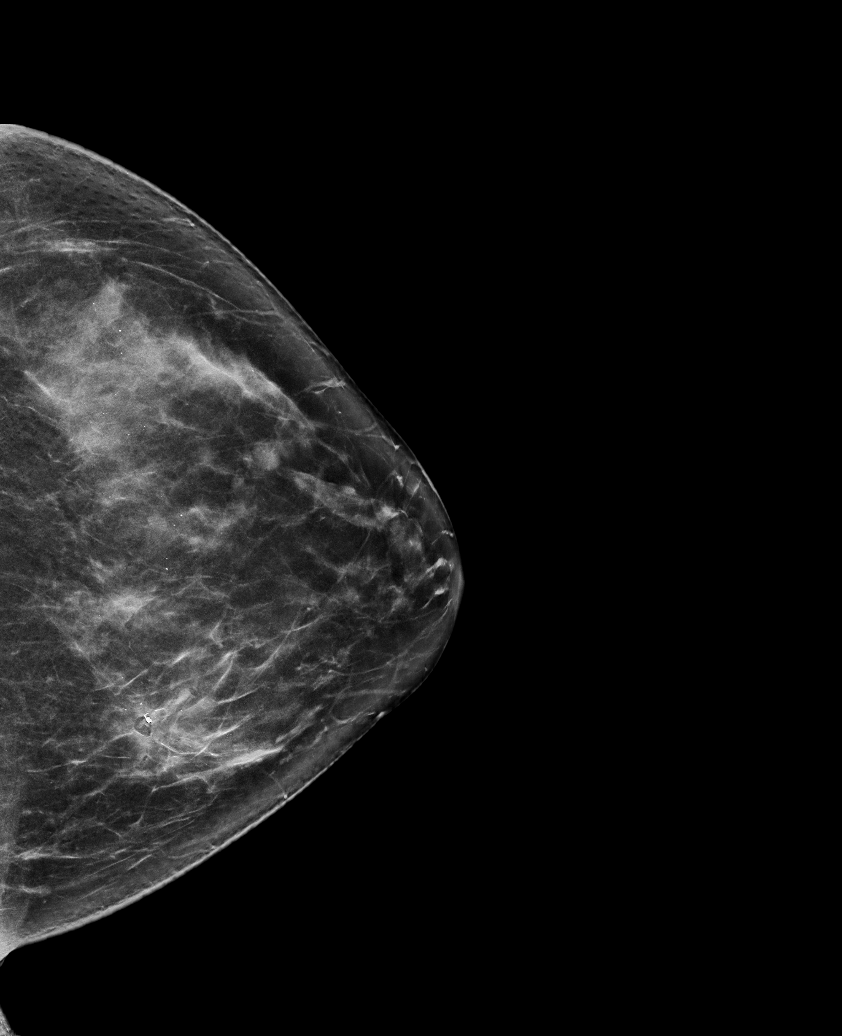

[L ML]
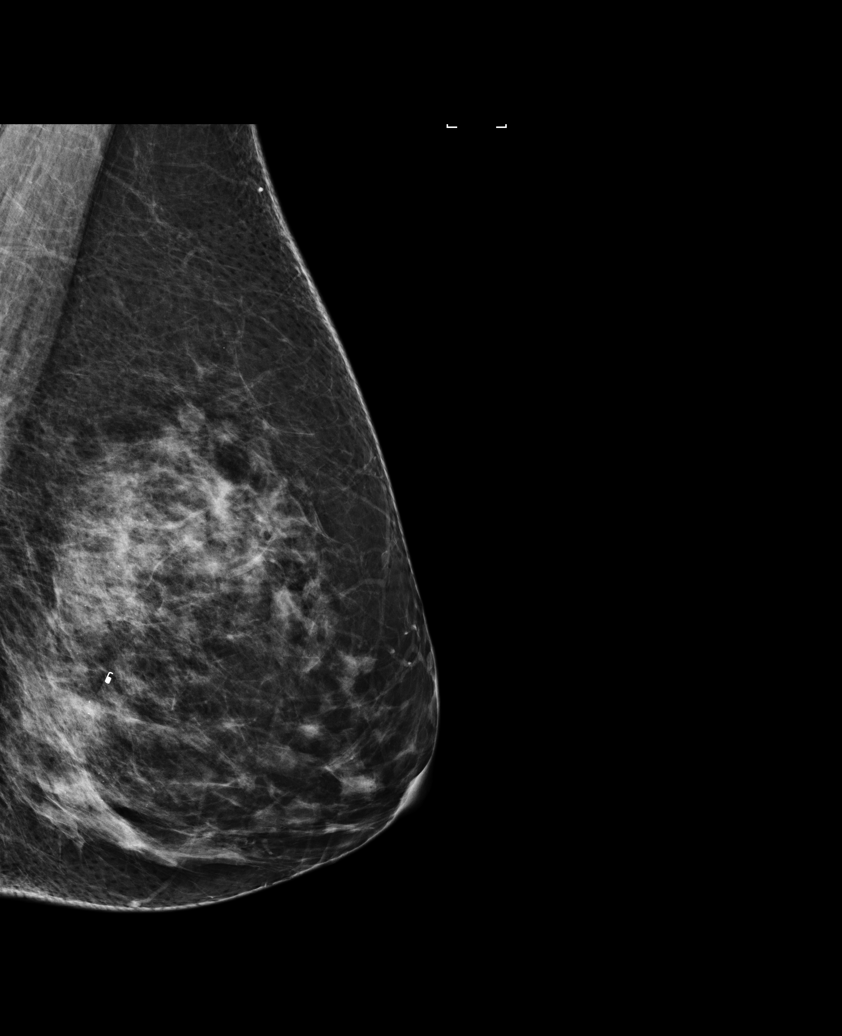

[L CC]
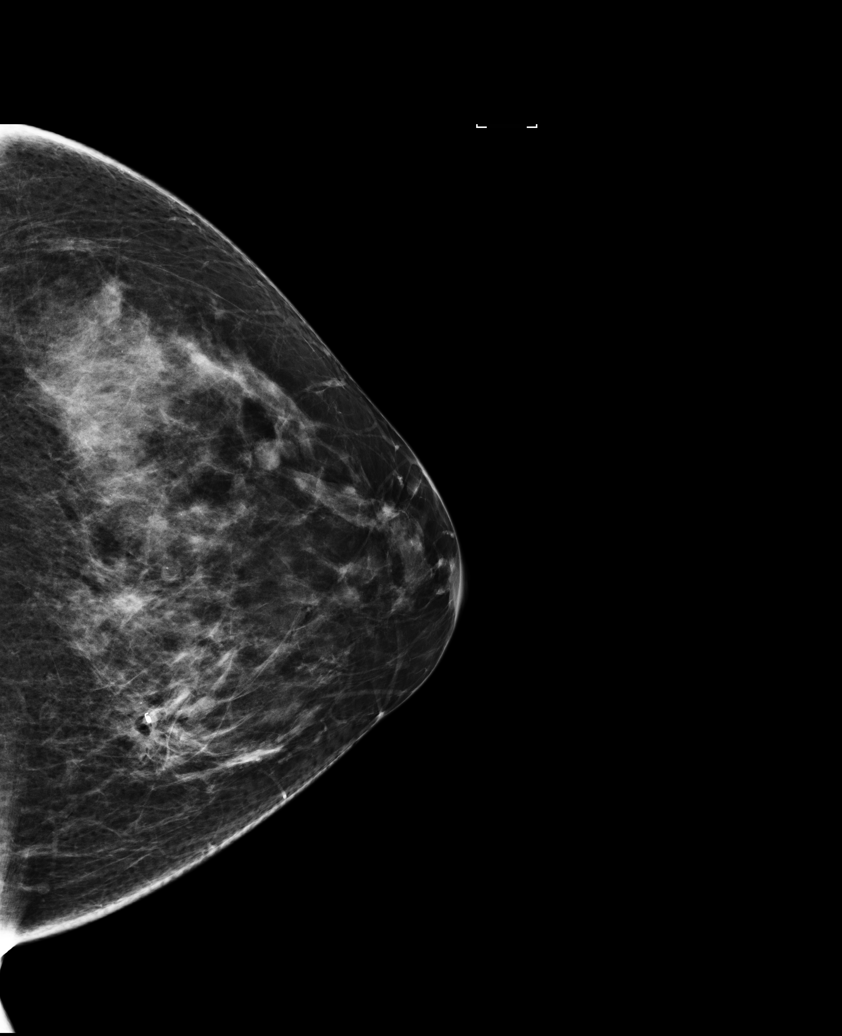

[L ML synth-2D]
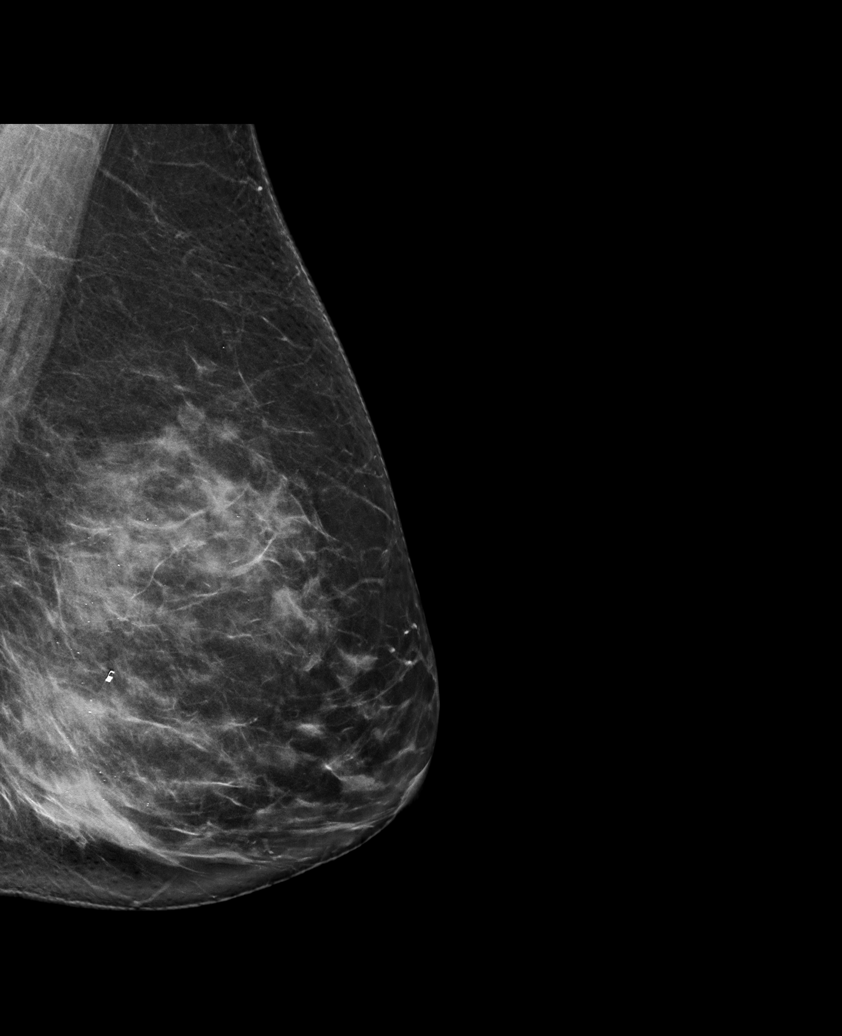

[L CC tomo · tomo slice 39/77.0]
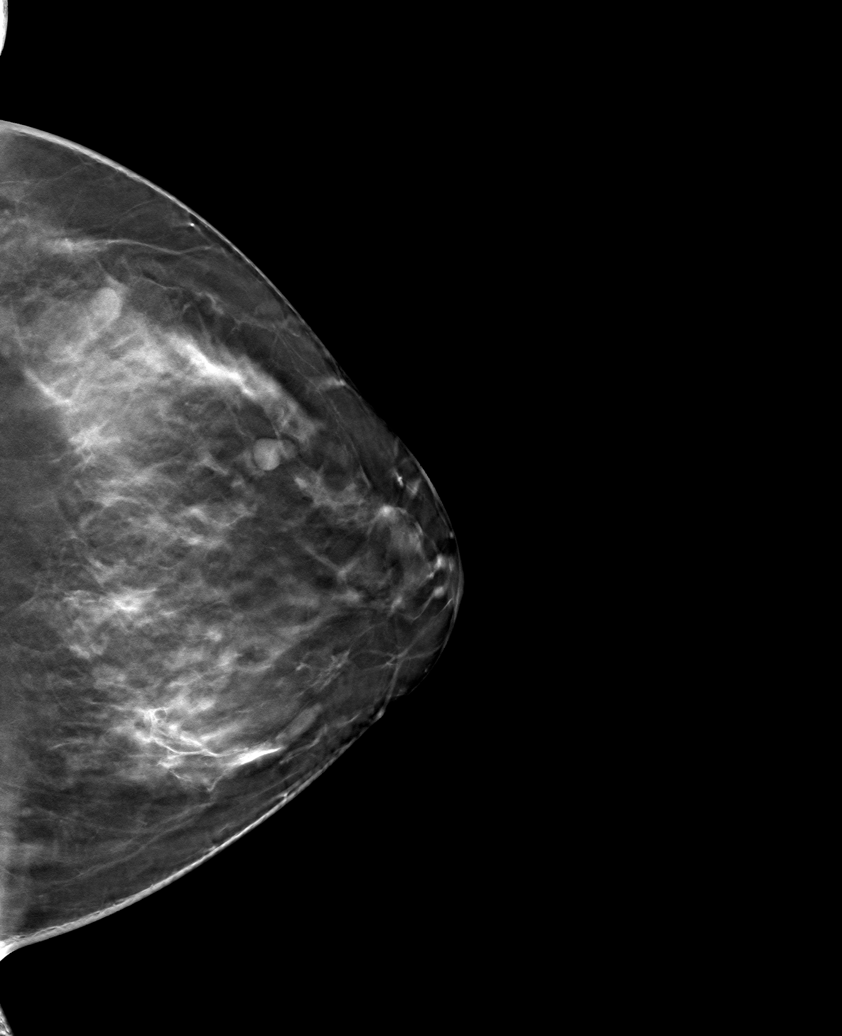

[L ML tomo · tomo slice 40/79.0]
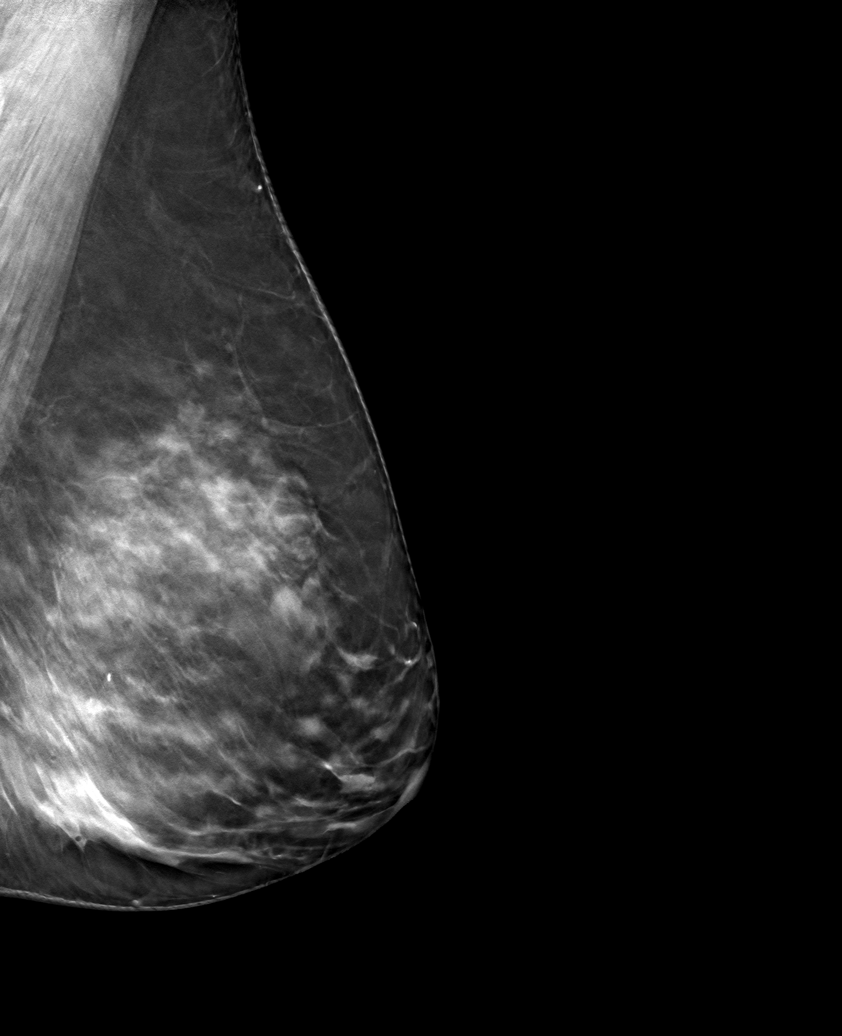

[6 of 14 positions shown; findings below may reference images not displayed]

FINDINGS: Mammographic images were obtained following stereotactic guided
biopsy of a left breast asymmetry and calcifications. The biopsy
clip is medial to the desired site of biopsy.
IMPRESSION: The region biopsied does not match the intended target. However,
pretest probability was very low. I suspect a biopsy to be benign.
If it is, recommend six-month follow-up mammogram on the left.

Final Assessment: Post Procedure Mammograms for Marker Placement

## 2018-03-20 IMAGING — MG STEREOTACTIC CORE NEEDLE BIOPSY
8 of 13 series · 8 of 21 positions shown · non-contrast
Comparison: Previous exams.

ADDENDUM:
Pathology revealed LOBULAR NEOPLASIA (ATYPICAL LOBULAR HYPERPLASIA),
FIBROCYSTIC CHANGES WITH CALCIFICATIONS, PSEUDOANGIOMATOUS STROMAL
HYPERPLASIA (PASH) of the Left breast, medial. This was found to be
concordant by Dr. DONNA LYN, with surgical consultation and
high risk screening recommended.

Pathology results were discussed with the patient by telephone. The
patient reported doing well after the biopsy with tenderness at the
site. Post biopsy instructions and care were reviewed and questions
were answered. The patient was encouraged to call The [REDACTED]
Surgical consultation has been arranged with Dr. DONNA LYN
at [REDACTED] on [DATE]. If surgery is
pursued the patient may need to return for an additional Left breast
biopsy.
Pathology results reported by DONNA LYN, RN on [DATE].
CLINICAL DATA: Biopsy of left asymmetry and calcifications
EXAM:
LEFT BREAST STEREOTACTIC CORE NEEDLE BIOPSY

[L (1 of 8)]
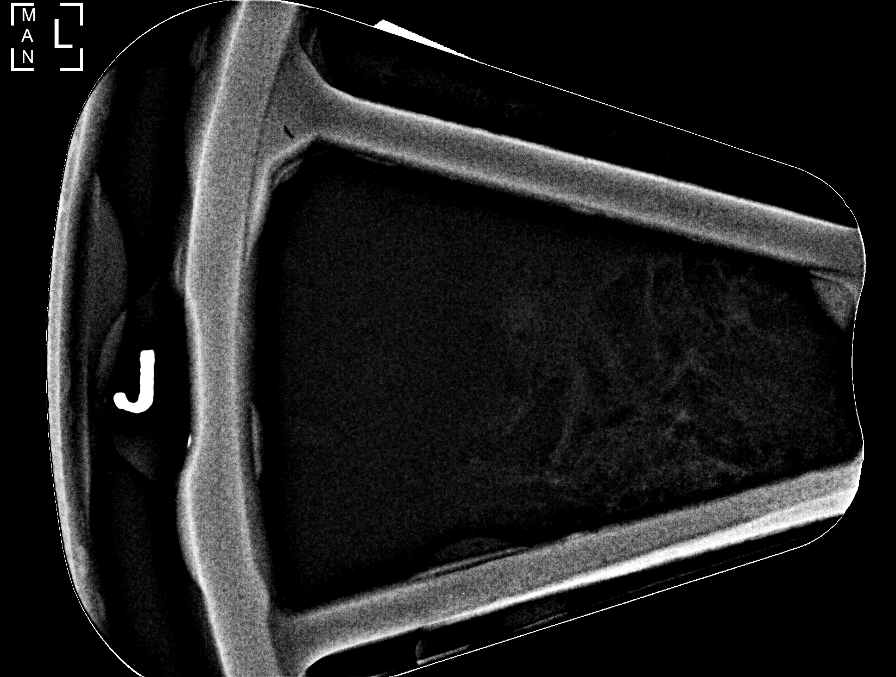

[L (2 of 8)]
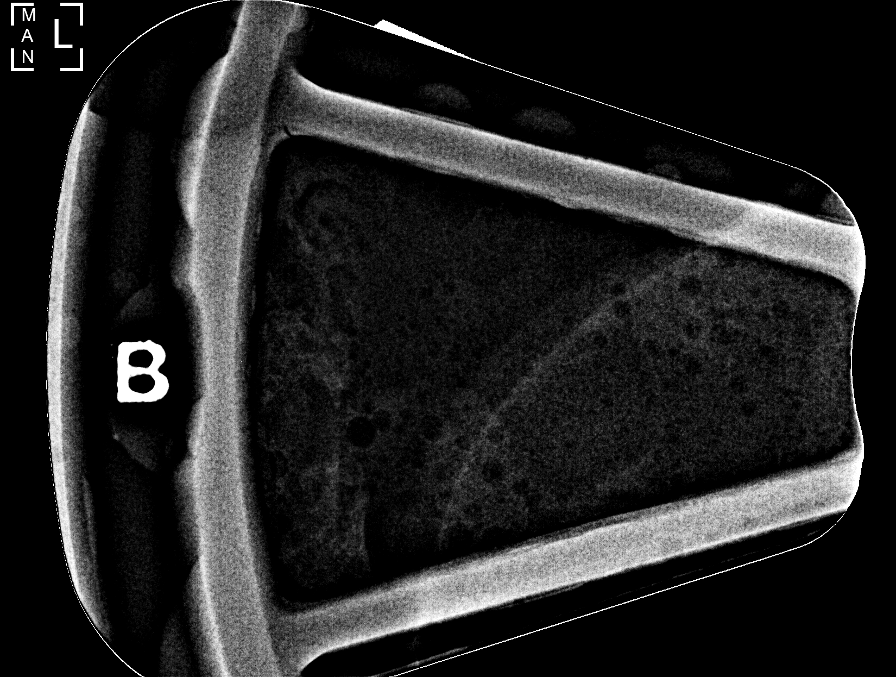

[L (3 of 8)]
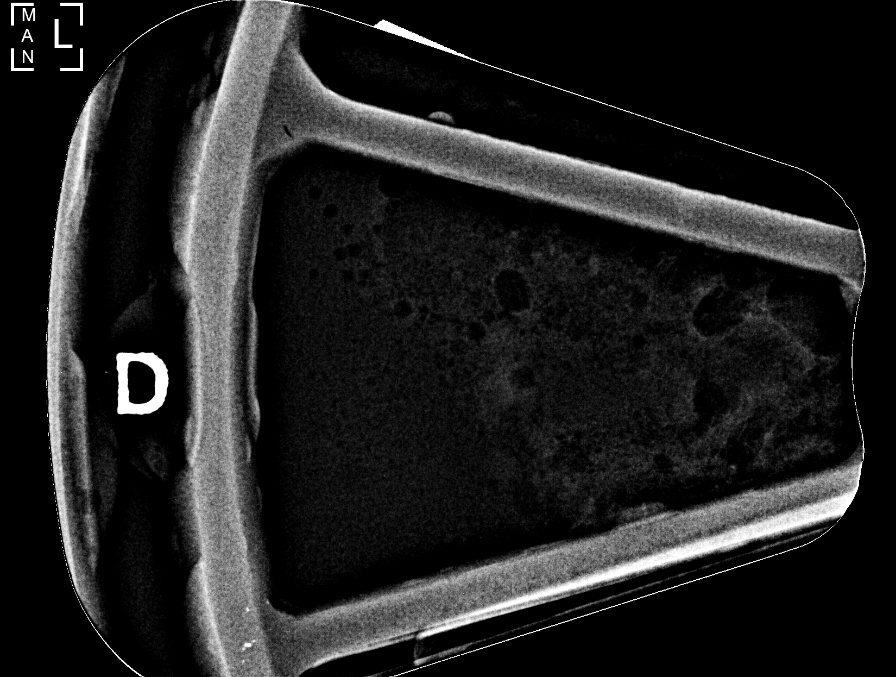

[L (4 of 8)]
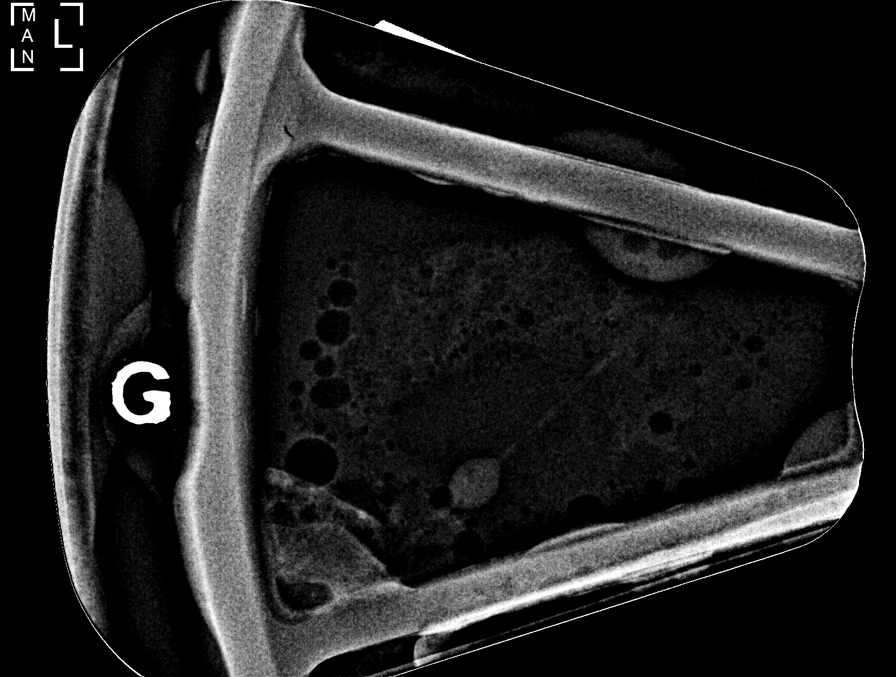

[L (5 of 8)]
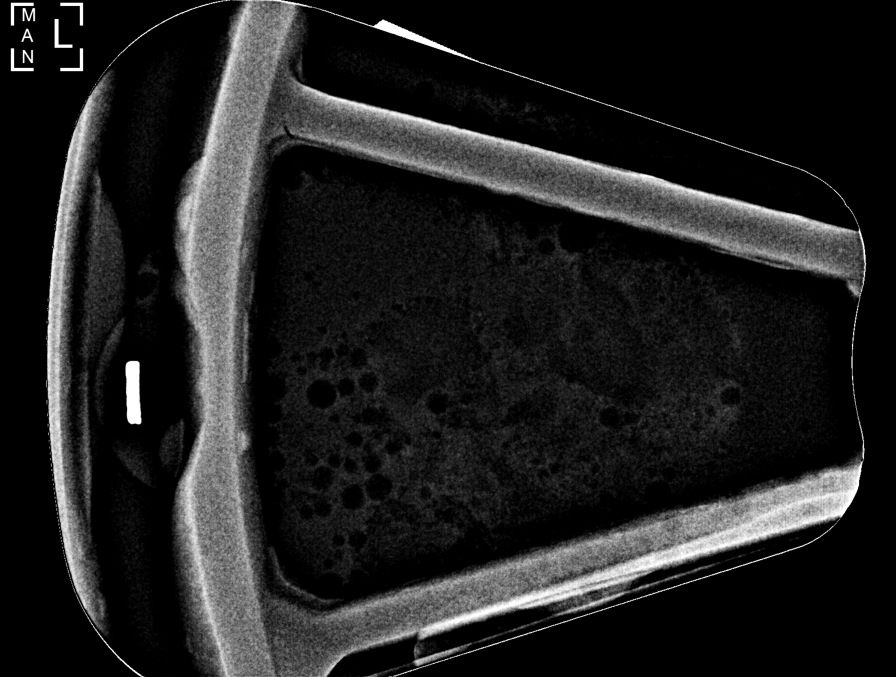

[L (6 of 8)]
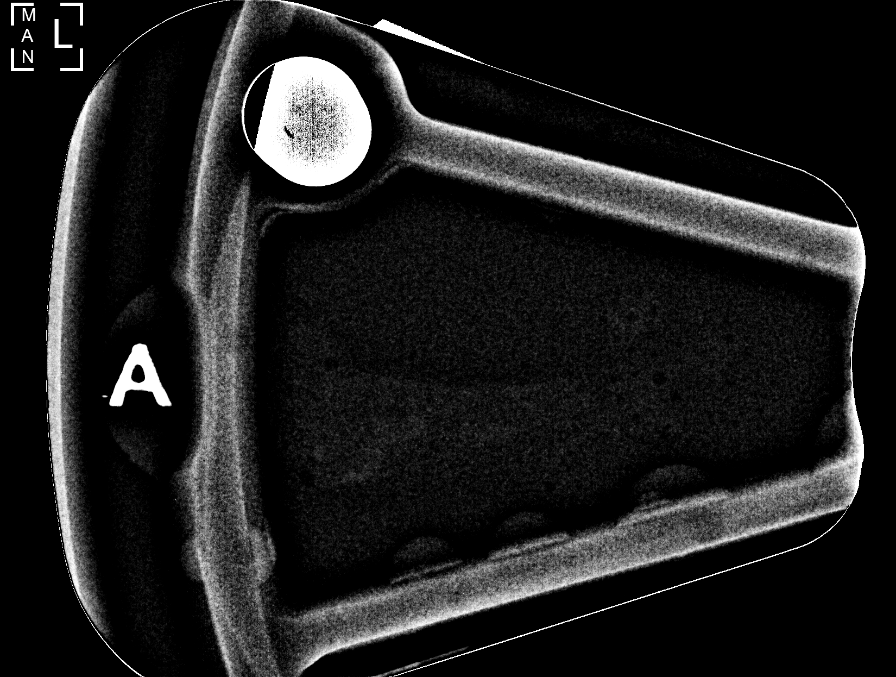

[L (7 of 8)]
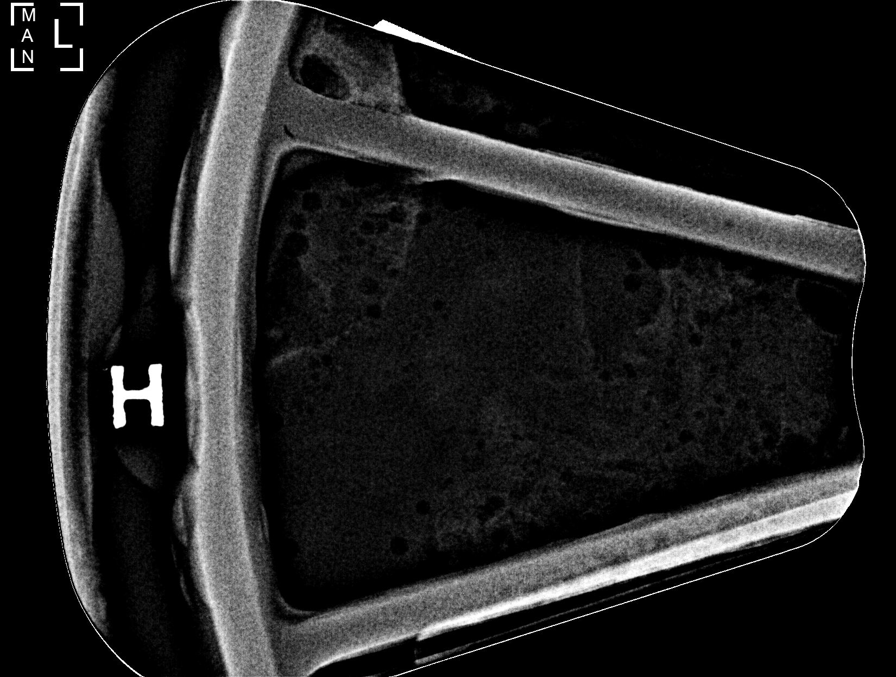

[L (8 of 8)]
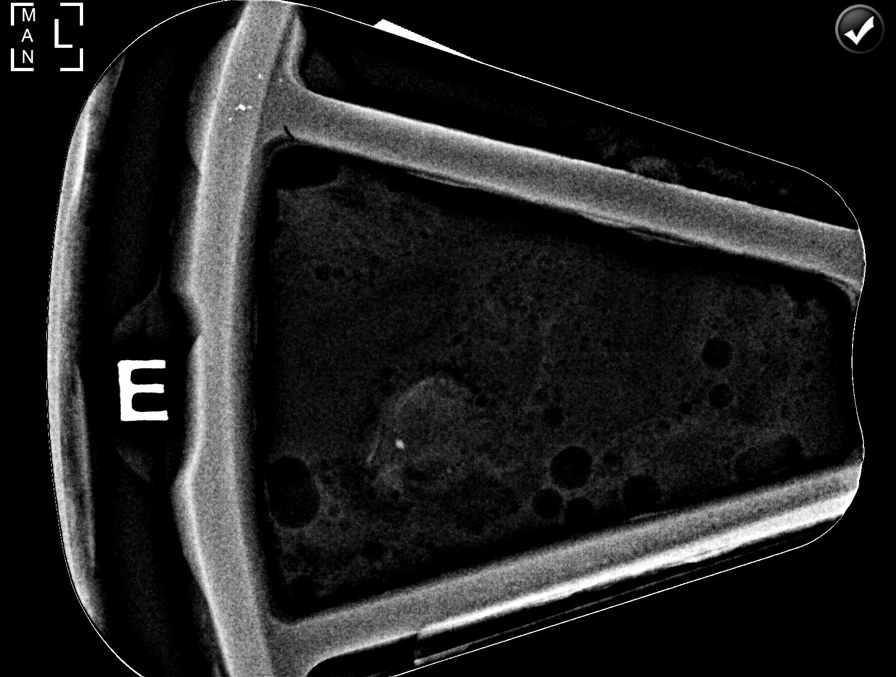

[8 of 21 positions shown; findings below may reference images not displayed]



Using sterile technique and 1% Lidocaine as local anesthetic, under
stereotactic guidance, a 9 gauge vacuum assisted device was used to
perform core needle biopsy of calcifications in the medial left
breast using a superior approach. Specimen radiograph was performed
showing calcifications in several specimens. Specimens with
calcifications are identified for pathology.

Lesion quadrant: Lower-inner

At the conclusion of the procedure, a coil shaped tissue marker clip
was deployed into the biopsy cavity. Follow-up 2-view mammogram was
performed and dictated separately.
IMPRESSION: Stereotactic-guided biopsy of left breast asymmetry and
calcifications. No apparent complications.

## 2018-10-23 HISTORY — PX: BREAST BIOPSY: SHX20

## 2018-10-25 ENCOUNTER — Other Ambulatory Visit: Payer: Self-pay | Admitting: General Surgery

## 2018-10-25 DIAGNOSIS — N6092 Unspecified benign mammary dysplasia of left breast: Secondary | ICD-10-CM

## 2018-11-10 ENCOUNTER — Ambulatory Visit
Admission: RE | Admit: 2018-11-10 | Discharge: 2018-11-10 | Disposition: A | Discharge status: Home / Self Care | Payer: BC Managed Care – PPO | Source: Ambulatory Visit | Attending: General Surgery | Admitting: General Surgery

## 2018-11-10 ENCOUNTER — Other Ambulatory Visit: Payer: Self-pay

## 2018-11-10 ENCOUNTER — Other Ambulatory Visit: Payer: Self-pay | Admitting: General Surgery

## 2018-11-10 DIAGNOSIS — N6489 Other specified disorders of breast: Secondary | ICD-10-CM

## 2018-11-10 DIAGNOSIS — N6092 Unspecified benign mammary dysplasia of left breast: Secondary | ICD-10-CM

## 2018-11-20 ENCOUNTER — Ambulatory Visit
Admission: RE | Admit: 2018-11-20 | Discharge: 2018-11-20 | Disposition: A | Discharge status: Home / Self Care | Payer: BC Managed Care – PPO | Source: Ambulatory Visit | Attending: General Surgery | Admitting: General Surgery

## 2018-11-20 ENCOUNTER — Other Ambulatory Visit: Payer: Self-pay

## 2018-11-20 DIAGNOSIS — N6489 Other specified disorders of breast: Secondary | ICD-10-CM

## 2018-11-20 IMAGING — MG MM CLIP PLACEMENT
4 series · 4 of 12 positions shown · non-contrast
Comparison: Previous exam(s).

CLINICAL DATA: Status post stereotactic biopsy for a LEFT breast
asymmetry. Earlier stereotactic biopsy of the inner LEFT breast
revealing ALH.

EXAM:
DIAGNOSTIC LEFT MAMMOGRAM POST STEREOTACTIC BIOPSY

[L ML synth-2D]
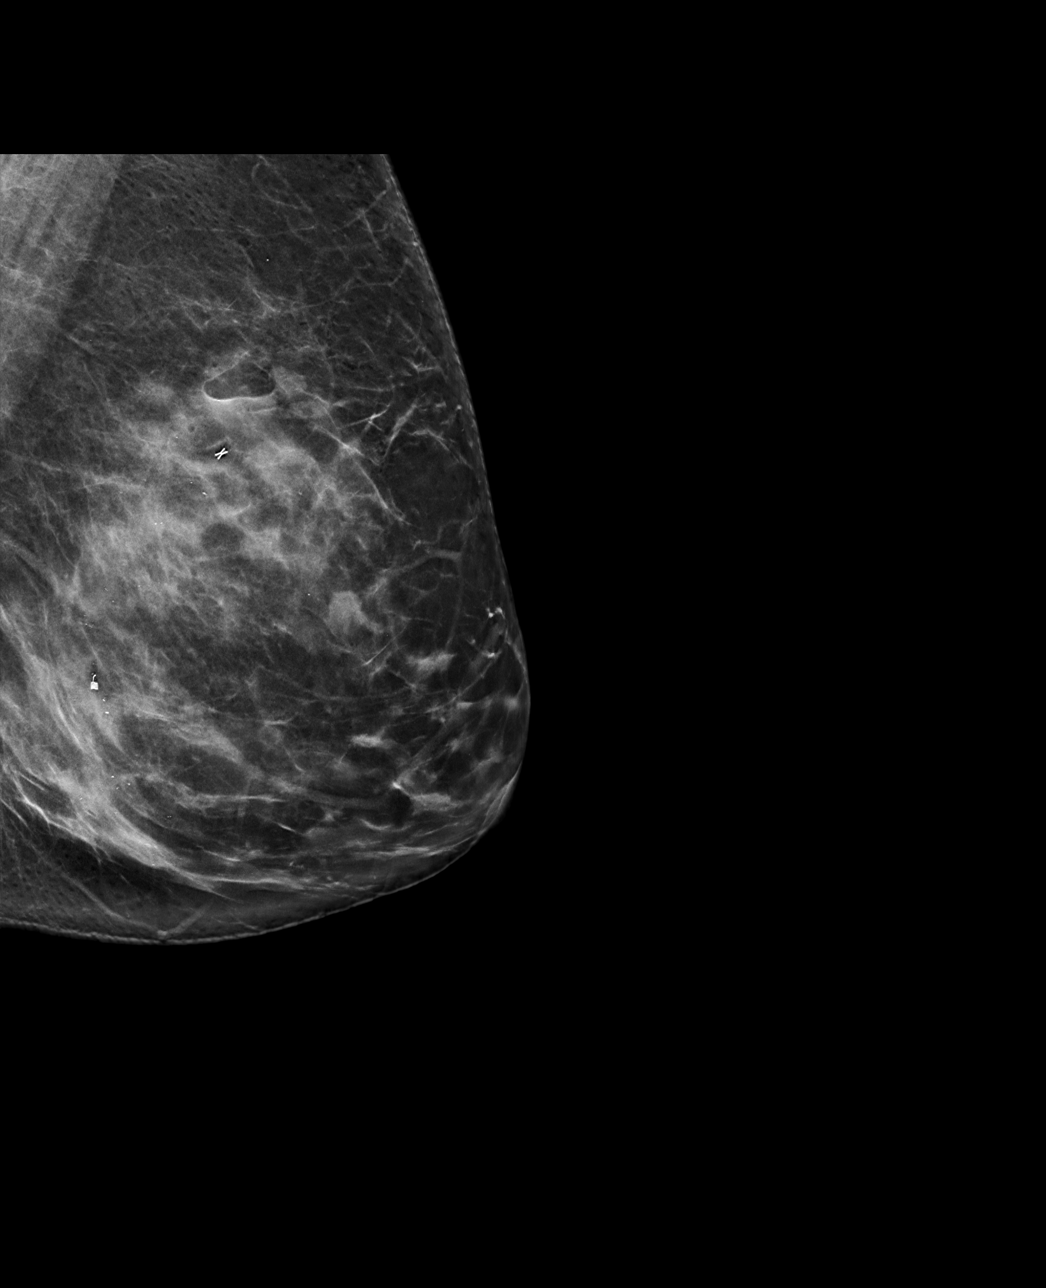

[L CC synth-2D]
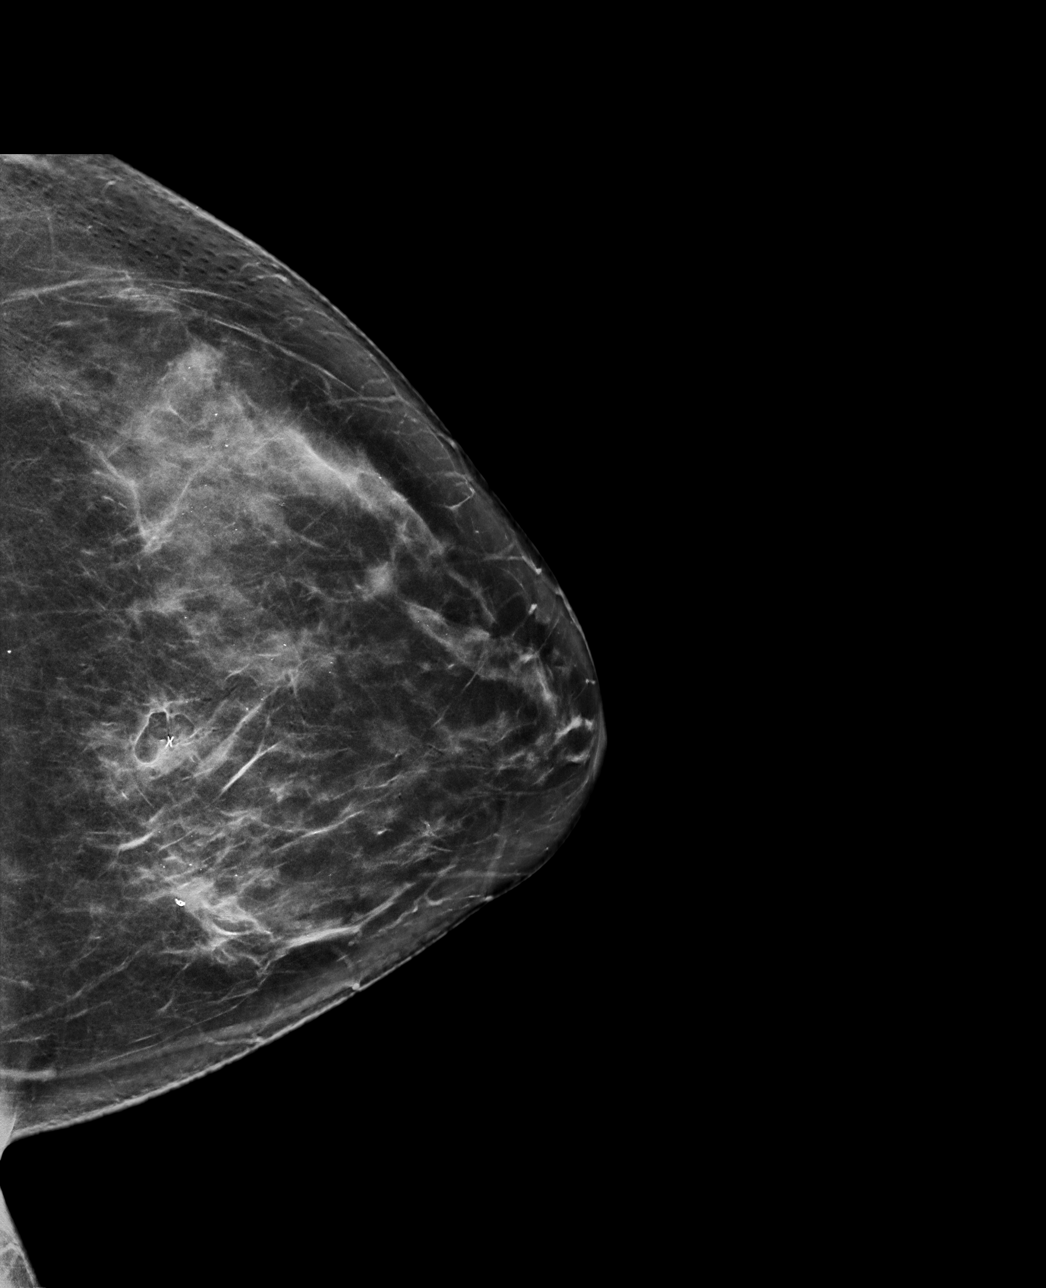

[L ML tomo · tomo slice 42/83.0]
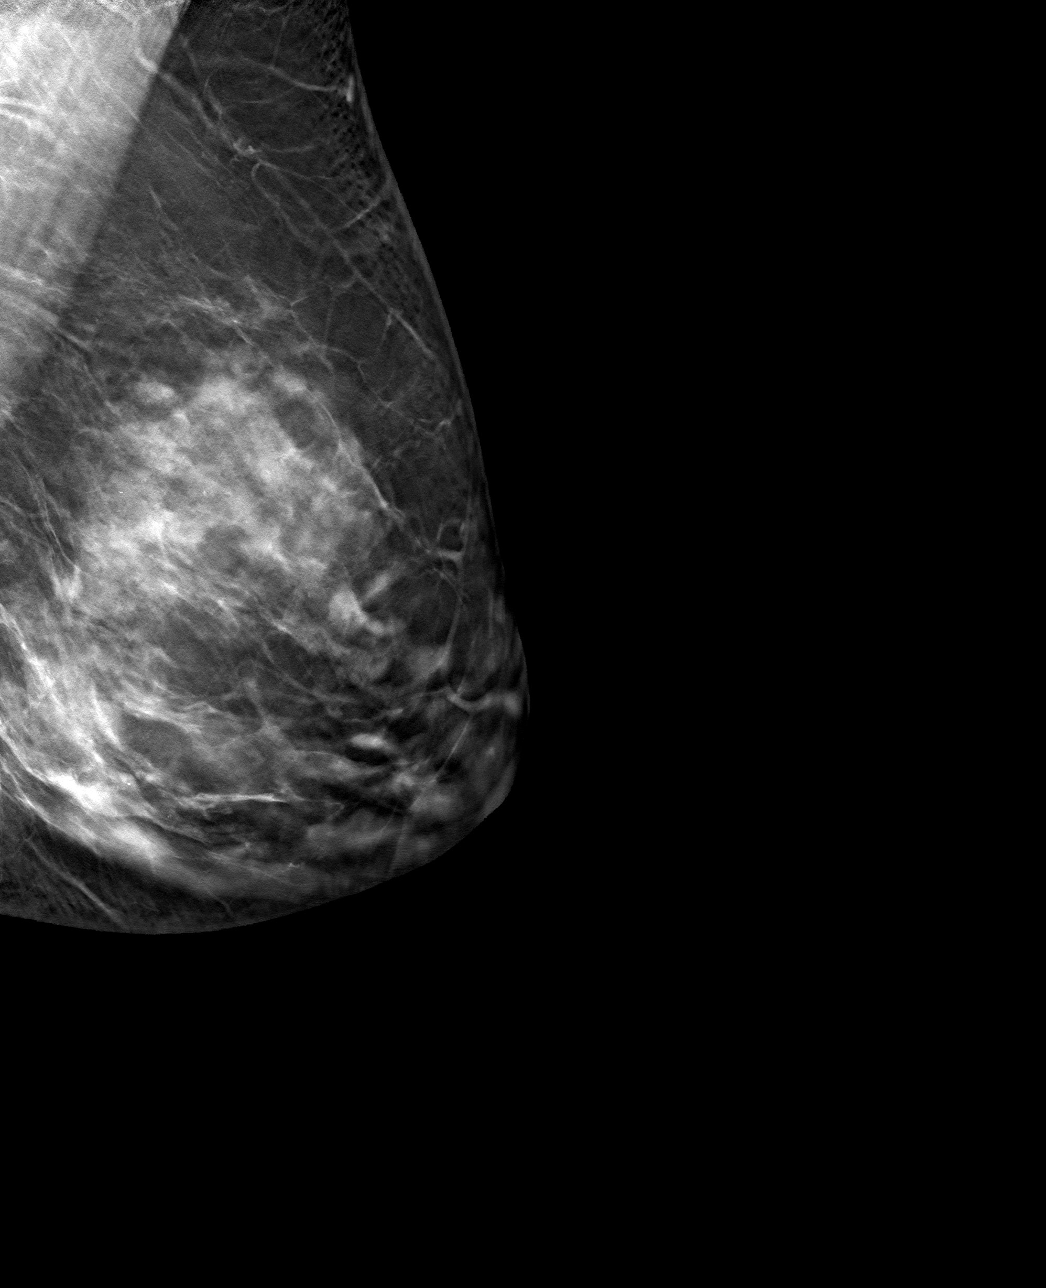

[L CC tomo · tomo slice 38/75.0]
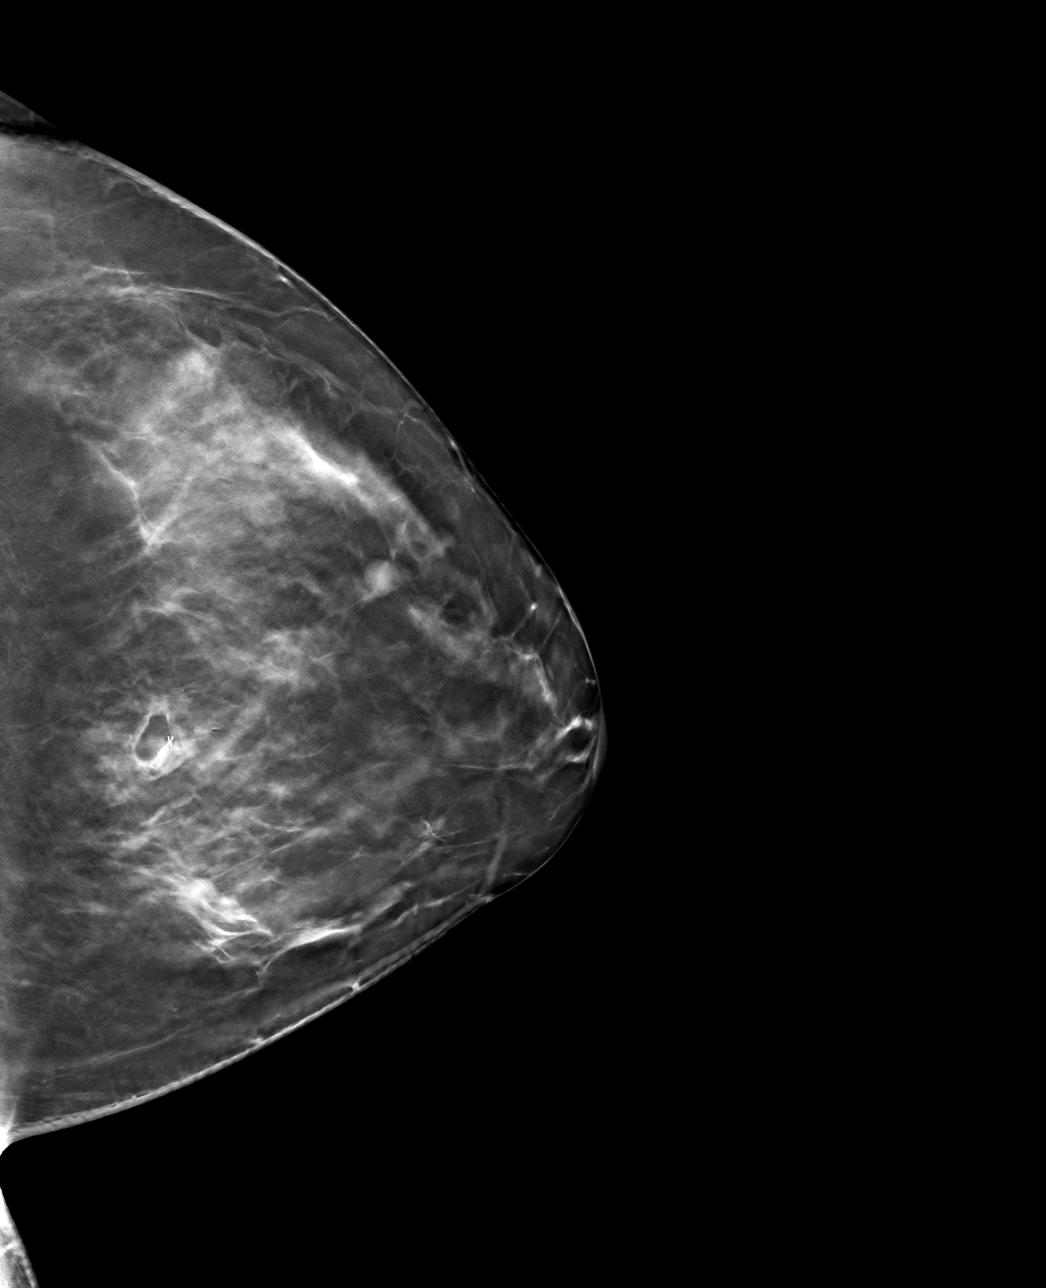

[4 of 12 positions shown; findings below may reference images not displayed]

FINDINGS: Mammographic images were obtained following stereotactic guided
biopsy of an asymmetry within the slightly inner LEFT breast at
posterior depth. X shaped clip is appropriately positioned at the
targeted asymmetry.
IMPRESSION: X shaped clip is appropriately positioned at the targeted asymmetry
within the upper inner quadrant of the LEFT breast.

Final Assessment: Post Procedure Mammograms for Marker Placement

## 2018-11-20 IMAGING — MG STEREOTACTIC CORE NEEDLE BIOPSY
8 of 17 series · 8 of 25 positions shown · non-contrast
Comparison: Previous exams.
COMPARISON: Previous exams.

Addendum:
CLINICAL DATA: Patient with a LEFT breast asymmetry presents today
for stereotactic biopsy using 3D tomosynthesis guidance.

Stereotactic biopsy in [5C] revealing atypical lobular hyperplasia.
EXAM:
LEFT BREAST STEREOTACTIC CORE NEEDLE BIOPSY

[L (1 of 8)]
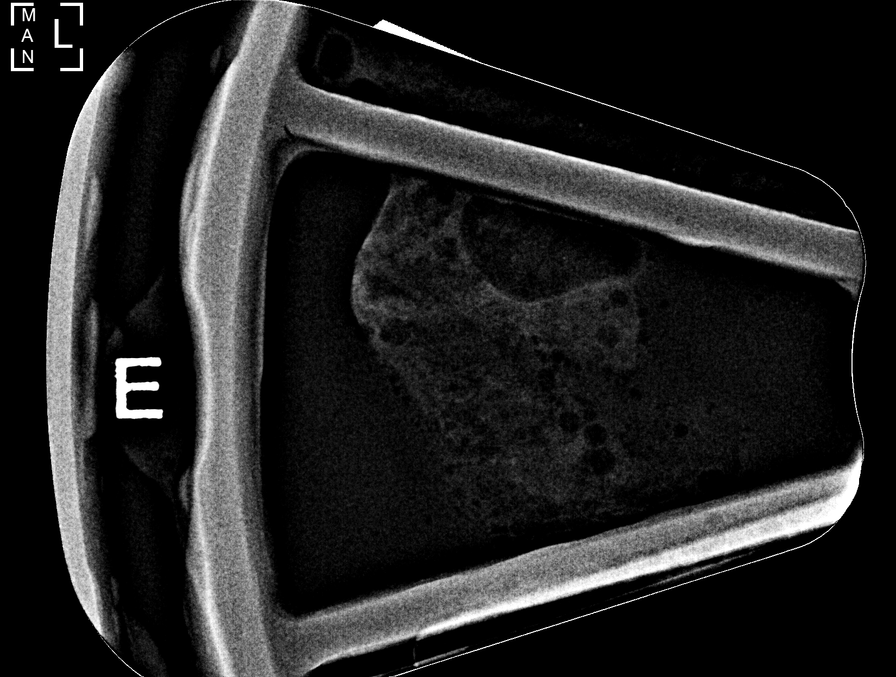

[L (2 of 8)]
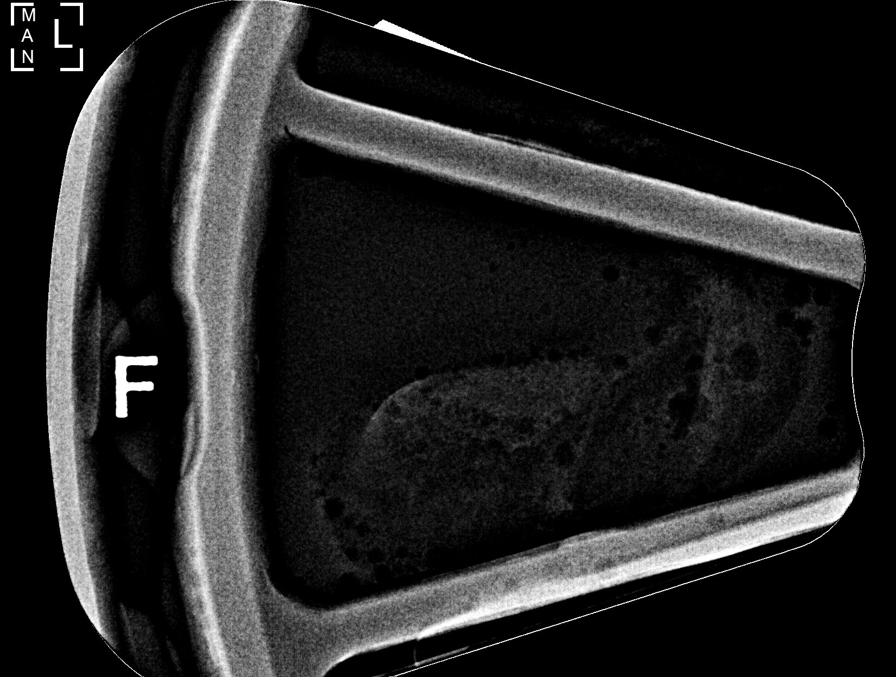

[L (3 of 8)]
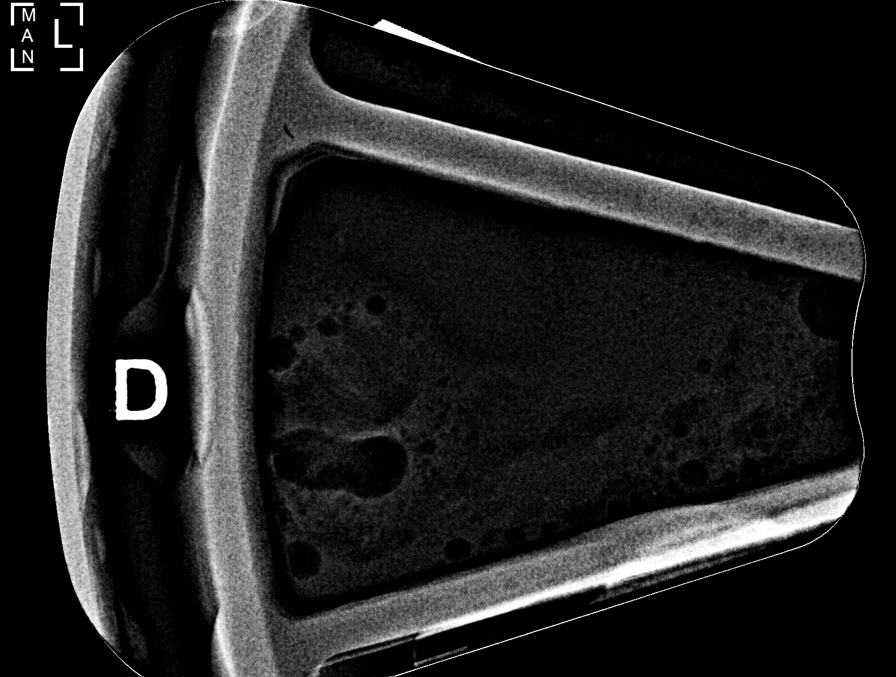

[L (4 of 8)]
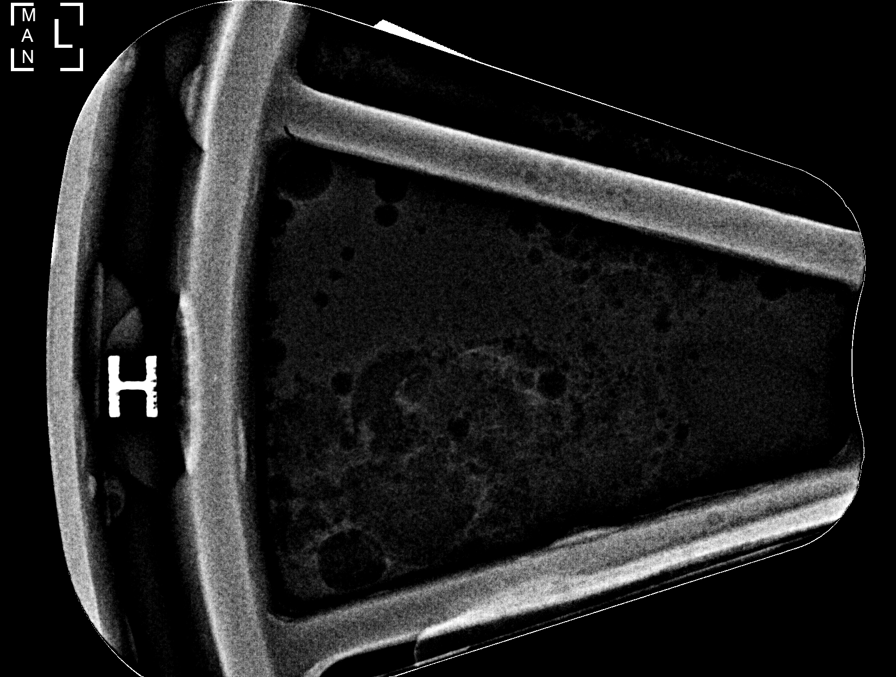

[L (5 of 8)]
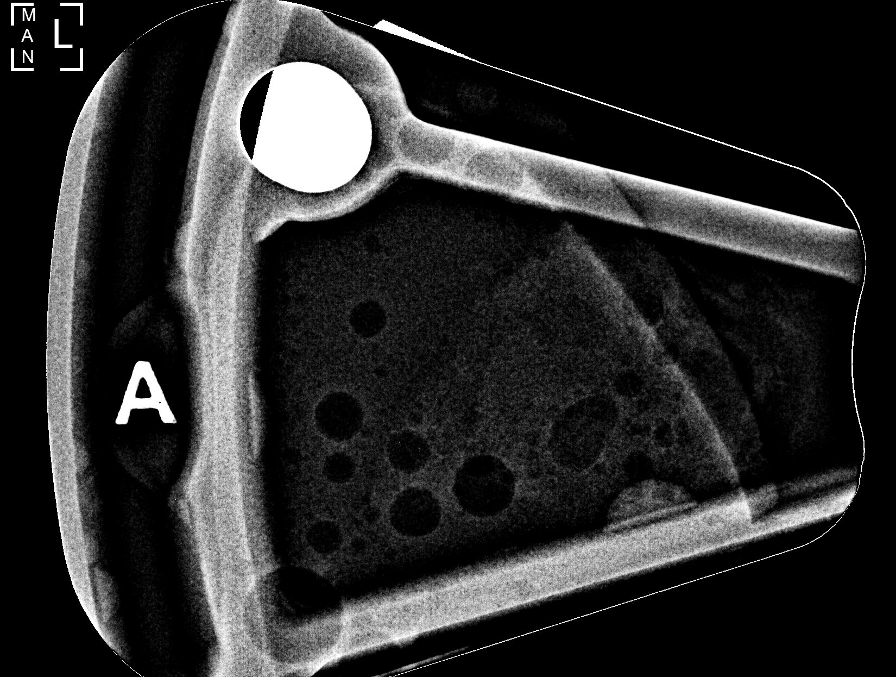

[L (6 of 8)]
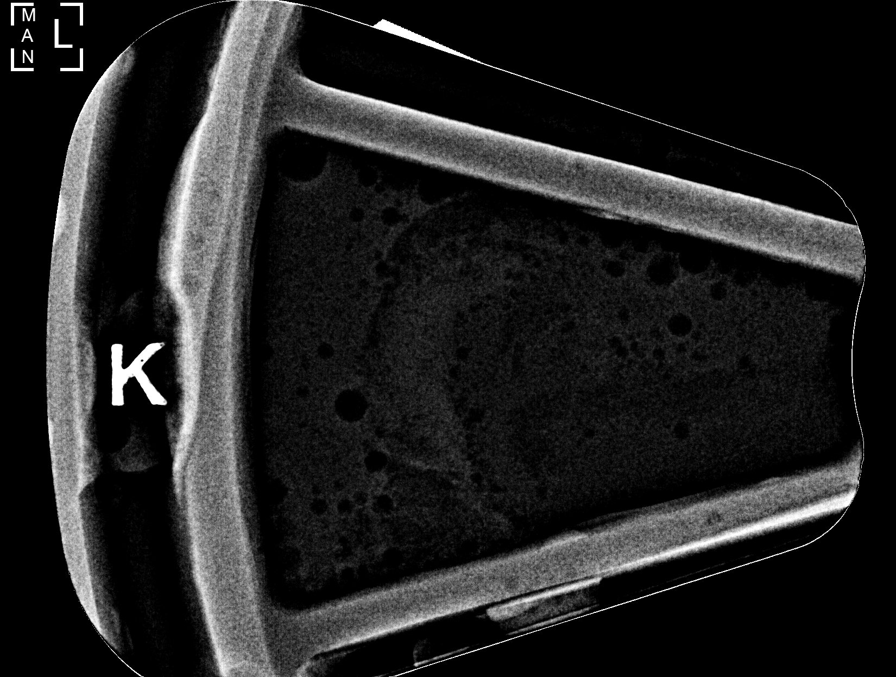

[L (7 of 8)]
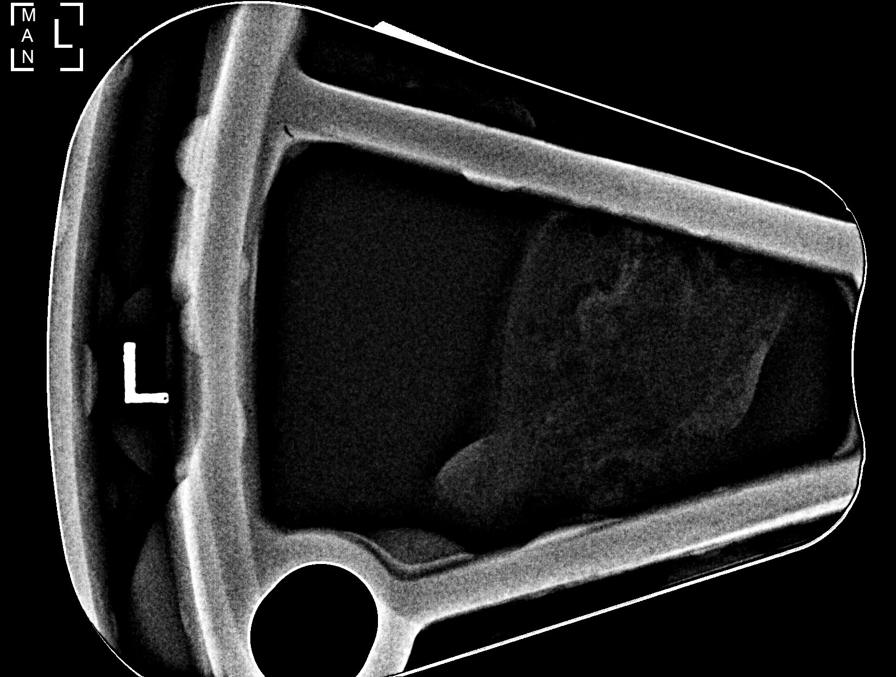

[L (8 of 8)]
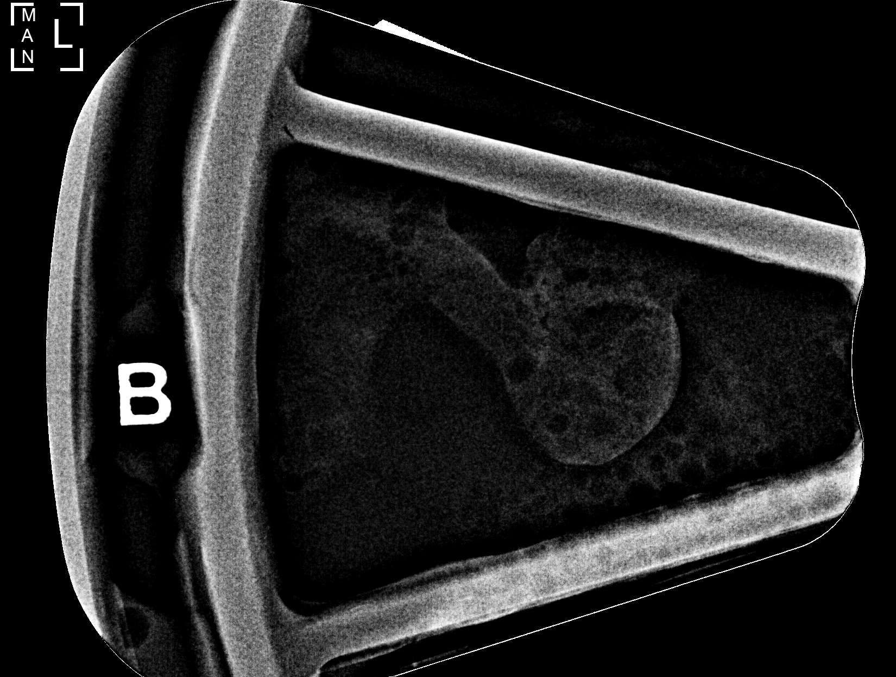

[8 of 25 positions shown; findings below may reference images not displayed]



Using sterile technique and 1% Lidocaine as local anesthetic, under
stereotactic guidance, a 9 gauge vacuum assisted device was used to
perform core needle biopsy of the LEFT breast asymmetry within the
inner LEFT breast at posterior depth using a superior approach.

Lesion quadrant: Upper inner quadrant

At the conclusion of the procedure, a X shaped clip tissue marker
clip was deployed into the biopsy cavity. Follow-up 2-view mammogram
was performed and dictated separately.
IMPRESSION: Stereotactic-guided biopsy of the LEFT breast asymmetry within the
inner LEFT breast, upper inner quadrant, at posterior depth. No
apparent complications.

ADDENDUM:
PATHOLOGY: Breast, left, needle core biopsy, UIQ, posterior LOBULAR
NEOPLASIA (ATYPICAL LOBULAR HYPERPLASIA). FIBROCYSTIC CHANGES WITH
CALCIFICATIONS. - PSEUDOANGIOMATOUS STROMAL HYPERPLASIA (PASH).

CONCORDANT: YES per Dr. FECUR.

I telephoned the patient on [DATE] at [5C] and discussed these
results and the recommendations stated below. All questions were
answered. The patient denies significant pain or bleeding from the
biopsy site. Biopsy site care instructions were reviewed and the
patient was asked to call [REDACTED] with
any questions or issues related to the biopsy.

RECOMMENDATION: Follow up with Dr. FECUR for surgical
consultation and high risk screening recommended. [REDACTED] will contact the patient with appointment information.

Additional site of atypical lobular hyperplasia was identified in
the LEFT breast on stereotactic biopsy of [DATE].

FECUR RN [DATE]



Using sterile technique and 1% Lidocaine as local anesthetic, under
stereotactic guidance, a 9 gauge vacuum assisted device was used to
perform core needle biopsy of the LEFT breast asymmetry within the
inner LEFT breast at posterior depth using a superior approach.

Lesion quadrant: Upper inner quadrant

At the conclusion of the procedure, a X shaped clip tissue marker
clip was deployed into the biopsy cavity. Follow-up 2-view mammogram
was performed and dictated separately.
IMPRESSION: Stereotactic-guided biopsy of the LEFT breast asymmetry within the
inner LEFT breast, upper inner quadrant, at posterior depth. No
apparent complications.

## 2018-12-11 ENCOUNTER — Other Ambulatory Visit: Payer: Self-pay | Admitting: General Surgery

## 2018-12-11 DIAGNOSIS — N6092 Unspecified benign mammary dysplasia of left breast: Secondary | ICD-10-CM

## 2018-12-13 ENCOUNTER — Other Ambulatory Visit: Payer: Self-pay | Admitting: General Surgery

## 2018-12-13 DIAGNOSIS — N6092 Unspecified benign mammary dysplasia of left breast: Secondary | ICD-10-CM

## 2018-12-14 ENCOUNTER — Other Ambulatory Visit: Payer: Self-pay | Admitting: General Surgery

## 2018-12-23 HISTORY — PX: BREAST EXCISIONAL BIOPSY: SUR124

## 2019-01-11 ENCOUNTER — Encounter (HOSPITAL_BASED_OUTPATIENT_CLINIC_OR_DEPARTMENT_OTHER): Payer: Self-pay | Admitting: *Deleted

## 2019-01-11 ENCOUNTER — Other Ambulatory Visit: Payer: Self-pay

## 2019-01-13 ENCOUNTER — Other Ambulatory Visit (HOSPITAL_COMMUNITY)
Admission: RE | Admit: 2019-01-13 | Discharge: 2019-01-13 | Disposition: A | Discharge status: Home / Self Care | Payer: BC Managed Care – PPO | Source: Ambulatory Visit | Attending: General Surgery | Admitting: General Surgery

## 2019-01-13 DIAGNOSIS — Z20828 Contact with and (suspected) exposure to other viral communicable diseases: Secondary | ICD-10-CM | POA: Diagnosis not present

## 2019-01-13 DIAGNOSIS — Z01812 Encounter for preprocedural laboratory examination: Secondary | ICD-10-CM | POA: Diagnosis not present

## 2019-01-13 DIAGNOSIS — N63 Unspecified lump in unspecified breast: Secondary | ICD-10-CM | POA: Diagnosis not present

## 2019-01-13 LAB — SARS CORONAVIRUS 2 (TAT 6-24 HRS): SARS Coronavirus 2: NEGATIVE

## 2019-01-16 ENCOUNTER — Ambulatory Visit
Admission: RE | Admit: 2019-01-16 | Discharge: 2019-01-16 | Disposition: A | Discharge status: Home / Self Care | Payer: BC Managed Care – PPO | Source: Ambulatory Visit | Attending: General Surgery | Admitting: General Surgery

## 2019-01-16 ENCOUNTER — Other Ambulatory Visit: Payer: Self-pay

## 2019-01-16 DIAGNOSIS — N6092 Unspecified benign mammary dysplasia of left breast: Secondary | ICD-10-CM

## 2019-01-16 IMAGING — MG NEEDLE LOCALIZATION OF THE LEFT BREAST WITH MAMMO GUIDANCE
5 series · 5 of 5 positions shown · non-contrast
Comparison: Previous exam(s).

CLINICAL DATA: Patient presents for radioactive seed localization
of an area of lobular neoplasia in the left breast prior to surgical
excision.

EXAM:
MAMMOGRAPHIC GUIDED RADIOACTIVE SEED LOCALIZATION OF THE LEFT BREAST

[L CC (1 of 3)]
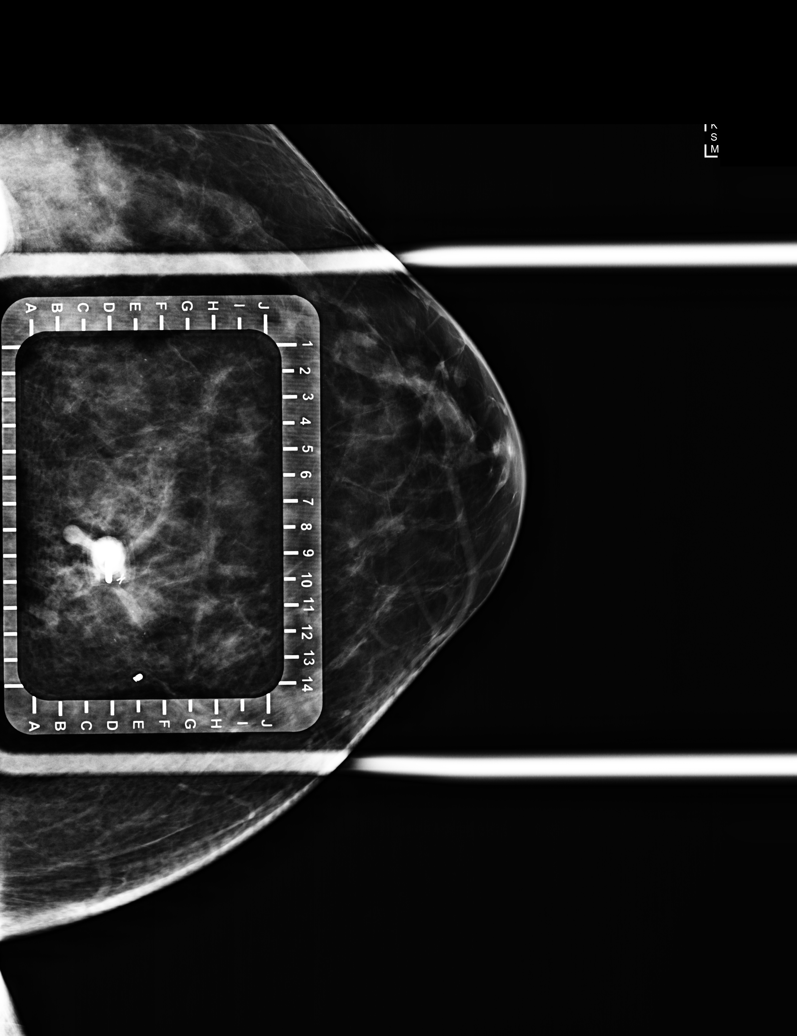

[L ML (1 of 2)]
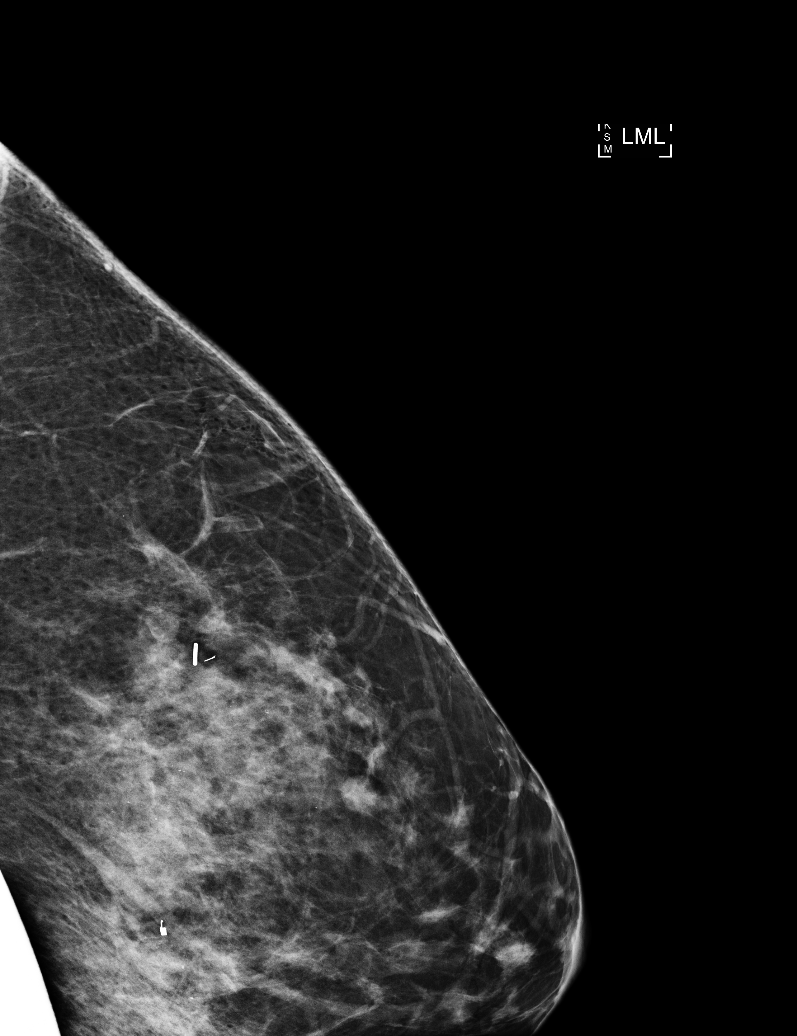

[L CC (2 of 3)]
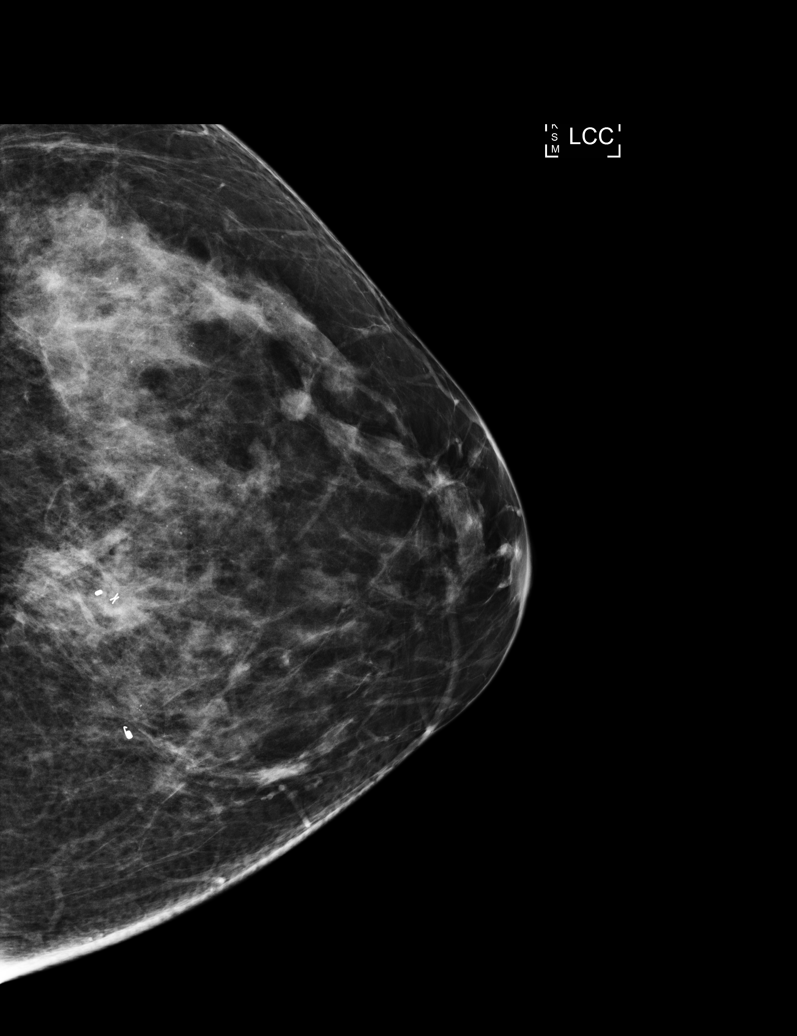

[L CC (3 of 3)]
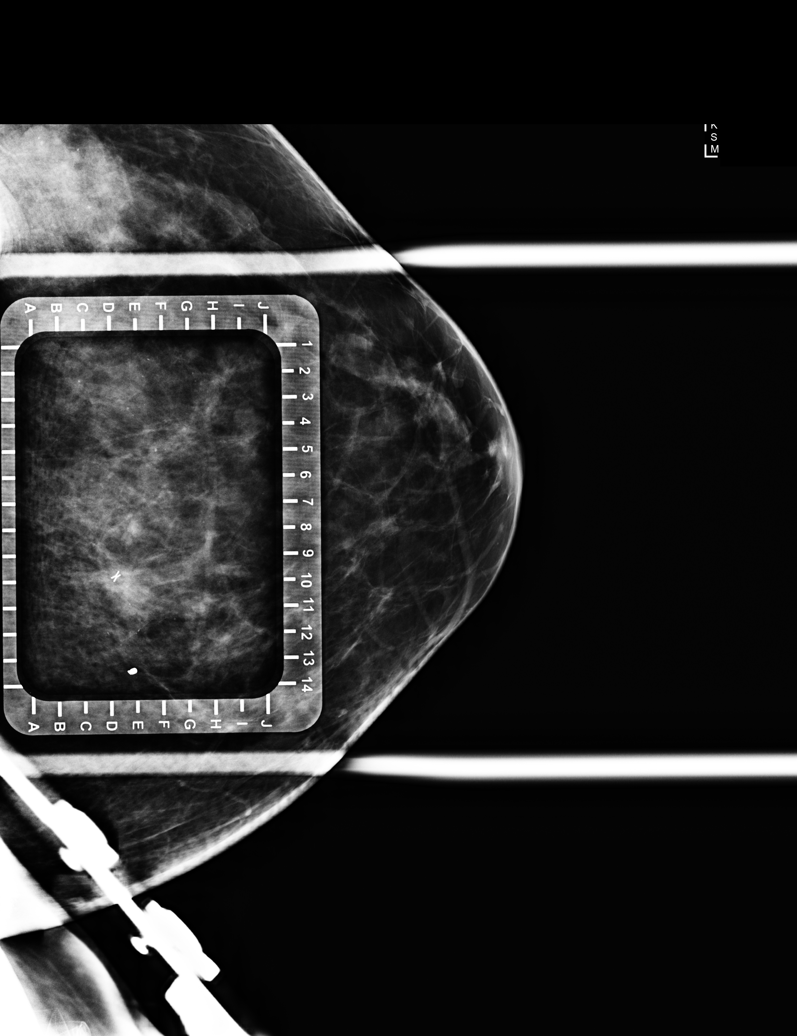

[L ML (2 of 2)]
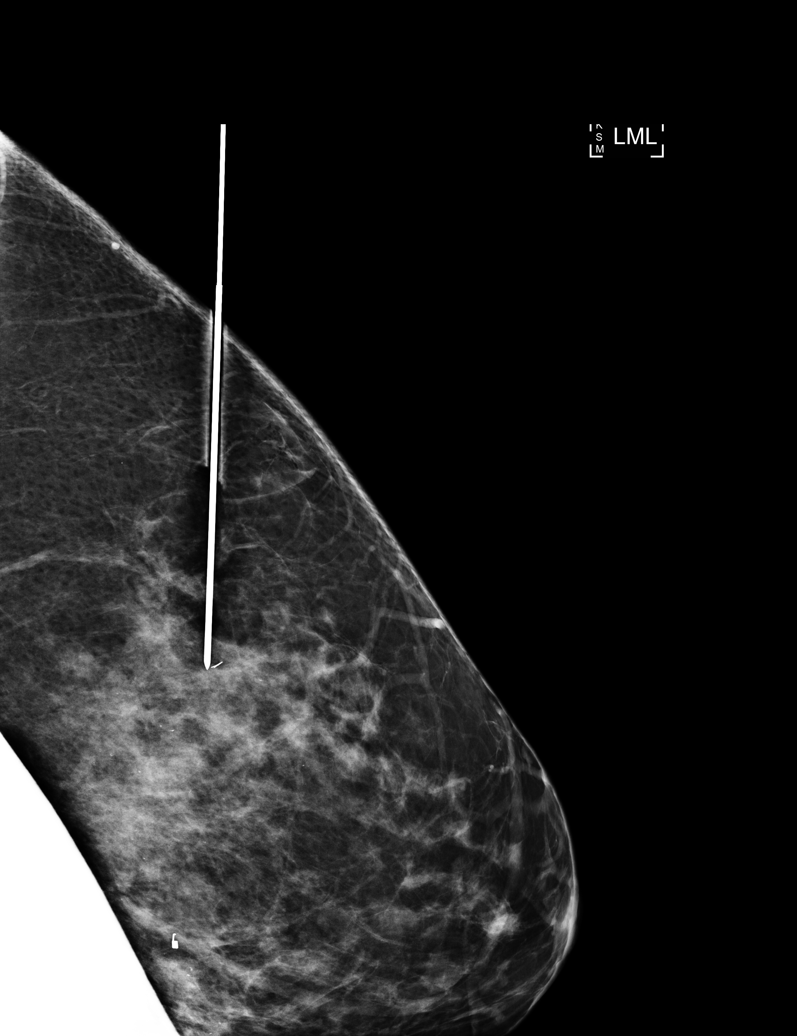

[5 of 5 positions shown; findings below may reference images not displayed]

FINDINGS: Patient presents for radioactive seed localization prior to surgical
excision. I met with the patient and we discussed the procedure of
seed localization including benefits and alternatives. We discussed
the high likelihood of a successful procedure. We discussed the
risks of the procedure including infection, bleeding, tissue injury
and further surgery. We discussed the low dose of radioactivity
involved in the procedure. Informed, written consent was given.

The usual time-out protocol was performed immediately prior to the
procedure.

Using mammographic guidance, sterile technique, 1% lidocaine and an
[BY] radioactive seed, the X shaped biopsy clip was localized using
a superior approach. The follow-up mammogram images confirm the seed
in the expected location and were marked for Dr. RICCI.

Follow-up survey of the patient confirms presence of the radioactive
seed.

Order number of [BY] seed:  [PHONE_NUMBER].

Total activity:  0.250 millicuries. Reference Date: [DATE]

The patient tolerated the procedure well and was released from the
[REDACTED]. She was given instructions regarding seed removal.
IMPRESSION: Radioactive seed localization of the left breast. No apparent
complications.

## 2019-01-16 NOTE — Progress Notes (Signed)

## 2019-01-17 ENCOUNTER — Encounter (HOSPITAL_BASED_OUTPATIENT_CLINIC_OR_DEPARTMENT_OTHER): Payer: Self-pay | Admitting: Certified Registered"

## 2019-01-17 ENCOUNTER — Ambulatory Visit (HOSPITAL_BASED_OUTPATIENT_CLINIC_OR_DEPARTMENT_OTHER): Payer: BC Managed Care – PPO | Admitting: Certified Registered"

## 2019-01-17 ENCOUNTER — Other Ambulatory Visit: Payer: Self-pay

## 2019-01-17 ENCOUNTER — Ambulatory Visit
Admission: RE | Admit: 2019-01-17 | Discharge: 2019-01-17 | Disposition: A | Discharge status: Home / Self Care | Payer: BC Managed Care – PPO | Source: Ambulatory Visit | Attending: General Surgery | Admitting: General Surgery

## 2019-01-17 ENCOUNTER — Encounter (HOSPITAL_BASED_OUTPATIENT_CLINIC_OR_DEPARTMENT_OTHER)
Admission: RE | Disposition: A | Discharge status: Home / Self Care | Payer: Self-pay | Source: Home / Self Care | Attending: General Surgery

## 2019-01-17 ENCOUNTER — Ambulatory Visit (HOSPITAL_BASED_OUTPATIENT_CLINIC_OR_DEPARTMENT_OTHER)
Admission: RE | Admit: 2019-01-17 | Discharge: 2019-01-17 | Disposition: A | Discharge status: Home / Self Care | Payer: BC Managed Care – PPO | Attending: General Surgery | Admitting: General Surgery

## 2019-01-17 DIAGNOSIS — D0502 Lobular carcinoma in situ of left breast: Secondary | ICD-10-CM | POA: Insufficient documentation

## 2019-01-17 DIAGNOSIS — N6092 Unspecified benign mammary dysplasia of left breast: Secondary | ICD-10-CM

## 2019-01-17 HISTORY — PX: BREAST LUMPECTOMY: SHX2

## 2019-01-17 HISTORY — PX: RADIOACTIVE SEED GUIDED EXCISIONAL BREAST BIOPSY: SHX6490

## 2019-01-17 IMAGING — DX BREAST SURGICAL SPECIMEN
1 series · 2 of 2 positions shown · non-contrast
Comparison: Previous exam(s).

CLINICAL DATA: Status post surgical excision of a left breast
lesion following radioactive seed localization.

EXAM:
SPECIMEN RADIOGRAPH OF THE LEFT BREAST

[Series 2: specimen digital x-ray, derived · left · 2 of 2 slices shown]
[im 1/2]
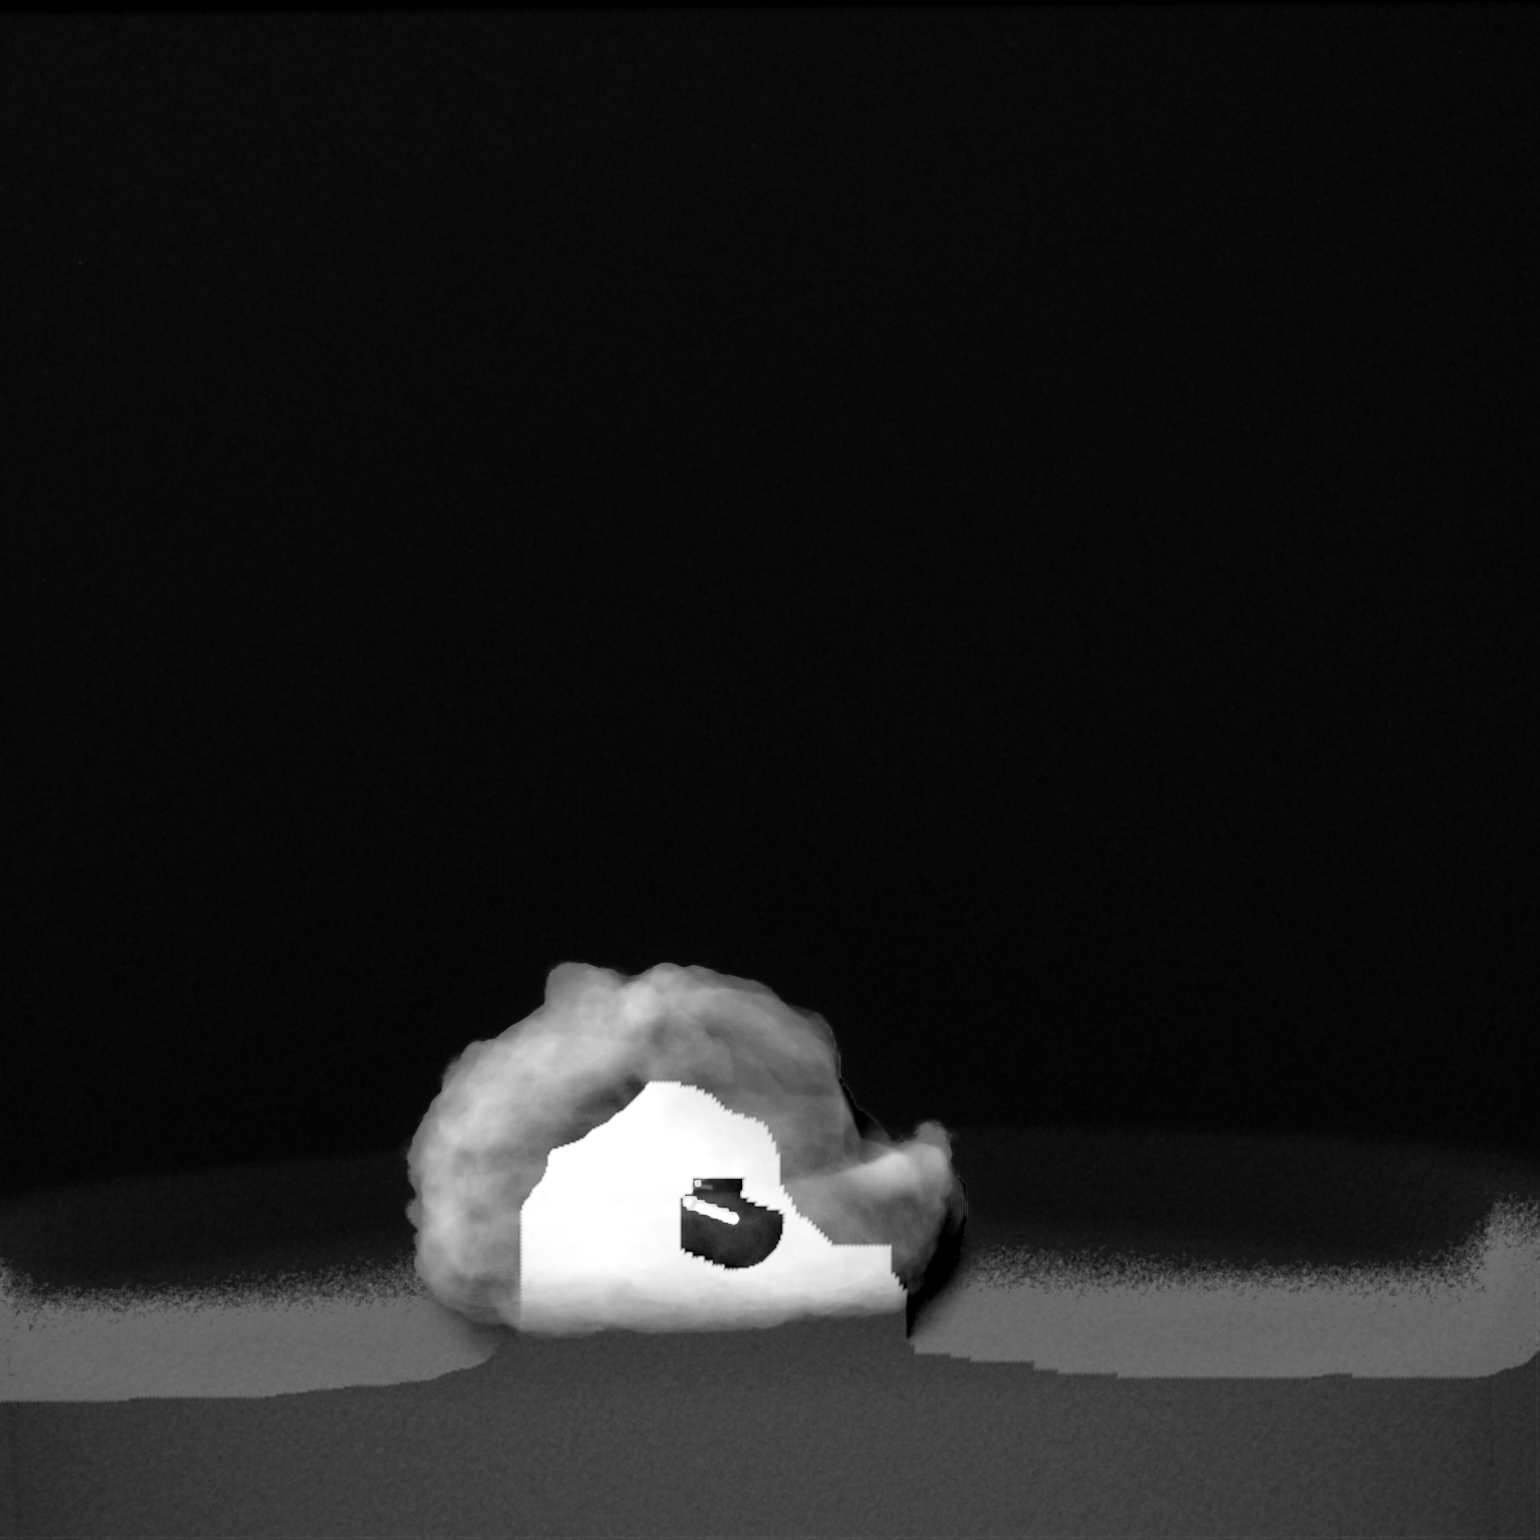
[im 2/2]
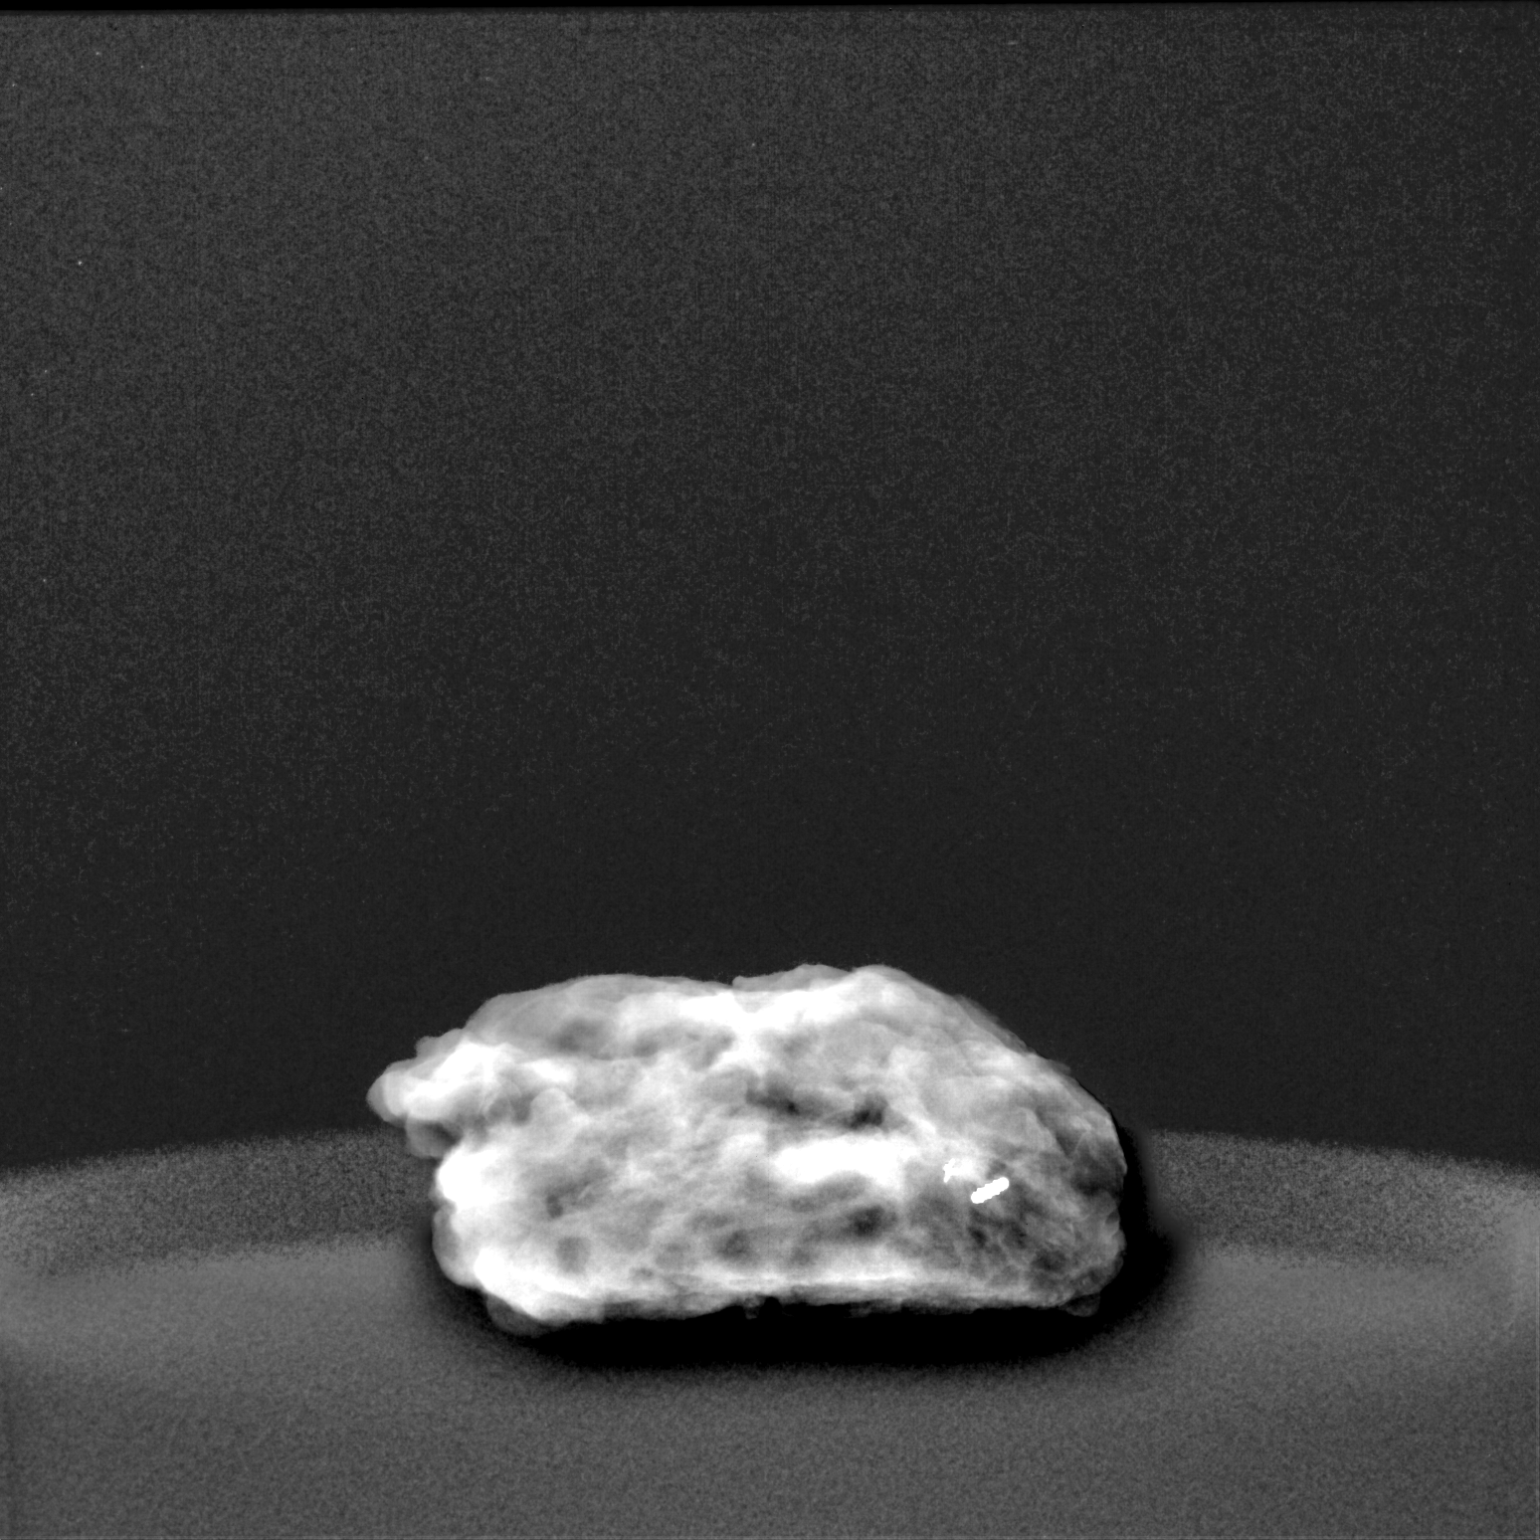

[2 of 2 positions shown; findings below may reference images not displayed]

FINDINGS: Status post excision of the left breast. The radioactive seed and
biopsy marker clip are present, completely intact, and were marked
for pathology.
IMPRESSION: Specimen radiograph of the left breast.

## 2019-01-17 SURGERY — RADIOACTIVE SEED GUIDED BREAST BIOPSY
Anesthesia: General | Site: Breast | Laterality: Left

## 2019-01-17 MED ORDER — 0.9 % SODIUM CHLORIDE (POUR BTL) OPTIME
TOPICAL | Status: DC | PRN
  Administered 2019-01-17: 1000 mL

## 2019-01-17 MED ORDER — OXYCODONE HCL 5 MG/5ML PO SOLN: 5.0000 mg | Freq: Once | ORAL | Status: AC | PRN

## 2019-01-17 MED ORDER — KETOROLAC TROMETHAMINE 30 MG/ML IJ SOLN: 30.0000 mg | Freq: Once | INTRAMUSCULAR | Status: DC | PRN

## 2019-01-17 MED ORDER — ONDANSETRON HCL 4 MG/2ML IJ SOLN: 4.0000 mg | Freq: Once | INTRAMUSCULAR | Status: DC | PRN

## 2019-01-17 MED ORDER — CEFAZOLIN SODIUM-DEXTROSE 2-4 GM/100ML-% IV SOLN
2.0000 g | INTRAVENOUS | Status: AC
  Administered 2019-01-17: 2 g via INTRAVENOUS

## 2019-01-17 MED ORDER — LIDOCAINE HCL (CARDIAC) PF 100 MG/5ML IV SOSY
PREFILLED_SYRINGE | INTRAVENOUS | Status: DC | PRN
  Administered 2019-01-17: 60 mg via INTRAVENOUS

## 2019-01-17 MED ORDER — MIDAZOLAM HCL 2 MG/2ML IJ SOLN
1.0000 mg | INTRAMUSCULAR | Status: DC | PRN
  Administered 2019-01-17: 2 mg via INTRAVENOUS

## 2019-01-17 MED ORDER — TRAMADOL HCL 50 MG PO TABS: 50.0000 mg | ORAL_TABLET | Freq: Four times a day (QID) | ORAL | 0 refills | Status: DC | PRN

## 2019-01-17 MED ORDER — OXYCODONE HCL 5 MG PO TABS
5.0000 mg | ORAL_TABLET | Freq: Once | ORAL | Status: AC | PRN
  Administered 2019-01-17: 5 mg via ORAL

## 2019-01-17 MED ORDER — FENTANYL CITRATE (PF) 100 MCG/2ML IJ SOLN: 25.0000 ug | INTRAMUSCULAR | Status: DC | PRN

## 2019-01-17 MED ORDER — OXYCODONE HCL 5 MG PO TABS
ORAL_TABLET | ORAL | Status: AC
  Filled 2019-01-17: qty 1

## 2019-01-17 MED ORDER — PROPOFOL 500 MG/50ML IV EMUL
INTRAVENOUS | Status: DC | PRN
  Administered 2019-01-17: 25 ug/kg/min via INTRAVENOUS

## 2019-01-17 MED ORDER — ACETAMINOPHEN 160 MG/5ML PO SOLN: 325.0000 mg | ORAL | Status: DC | PRN

## 2019-01-17 MED ORDER — MEPERIDINE HCL 25 MG/ML IJ SOLN: 6.2500 mg | INTRAMUSCULAR | Status: DC | PRN

## 2019-01-17 MED ORDER — ACETAMINOPHEN 500 MG PO TABS
ORAL_TABLET | ORAL | Status: AC
  Filled 2019-01-17: qty 2

## 2019-01-17 MED ORDER — CEFAZOLIN SODIUM-DEXTROSE 2-4 GM/100ML-% IV SOLN
INTRAVENOUS | Status: AC
  Filled 2019-01-17: qty 100

## 2019-01-17 MED ORDER — MIDAZOLAM HCL 2 MG/2ML IJ SOLN
INTRAMUSCULAR | Status: AC
  Filled 2019-01-17: qty 2

## 2019-01-17 MED ORDER — DEXAMETHASONE SODIUM PHOSPHATE 4 MG/ML IJ SOLN
INTRAMUSCULAR | Status: DC | PRN
  Administered 2019-01-17: 10 mg via INTRAVENOUS

## 2019-01-17 MED ORDER — ENSURE PRE-SURGERY PO LIQD: 296.0000 mL | Freq: Once | ORAL | Status: DC

## 2019-01-17 MED ORDER — GABAPENTIN 100 MG PO CAPS
100.0000 mg | ORAL_CAPSULE | ORAL | Status: AC
  Administered 2019-01-17: 100 mg via ORAL

## 2019-01-17 MED ORDER — ACETAMINOPHEN 325 MG PO TABS: 325.0000 mg | ORAL_TABLET | ORAL | Status: DC | PRN

## 2019-01-17 MED ORDER — LACTATED RINGERS IV SOLN
INTRAVENOUS | Status: DC
  Administered 2019-01-17: 12:00:00 via INTRAVENOUS

## 2019-01-17 MED ORDER — BUPIVACAINE HCL (PF) 0.5 % IJ SOLN
INTRAMUSCULAR | Status: DC | PRN
  Administered 2019-01-17: 10 mL

## 2019-01-17 MED ORDER — FENTANYL CITRATE (PF) 100 MCG/2ML IJ SOLN
INTRAMUSCULAR | Status: AC
  Filled 2019-01-17: qty 2

## 2019-01-17 MED ORDER — GABAPENTIN 100 MG PO CAPS
ORAL_CAPSULE | ORAL | Status: AC
  Filled 2019-01-17: qty 1

## 2019-01-17 MED ORDER — ACETAMINOPHEN 500 MG PO TABS
1000.0000 mg | ORAL_TABLET | ORAL | Status: AC
  Administered 2019-01-17: 1000 mg via ORAL

## 2019-01-17 MED ORDER — CHLORHEXIDINE GLUCONATE CLOTH 2 % EX PADS: 6.0000 | MEDICATED_PAD | Freq: Once | CUTANEOUS | Status: DC

## 2019-01-17 MED ORDER — FENTANYL CITRATE (PF) 100 MCG/2ML IJ SOLN
50.0000 ug | INTRAMUSCULAR | Status: DC | PRN
  Administered 2019-01-17: 100 ug via INTRAVENOUS

## 2019-01-17 SURGICAL SUPPLY — 56 items
APPLIER CLIP 9.375 MED OPEN (MISCELLANEOUS)
BINDER BREAST LRG (GAUZE/BANDAGES/DRESSINGS) IMPLANT
BINDER BREAST MEDIUM (GAUZE/BANDAGES/DRESSINGS) IMPLANT
BINDER BREAST XLRG (GAUZE/BANDAGES/DRESSINGS) ×3 IMPLANT
BINDER BREAST XXLRG (GAUZE/BANDAGES/DRESSINGS) IMPLANT
BLADE SURG 15 STRL LF DISP TIS (BLADE) ×1 IMPLANT
BLADE SURG 15 STRL SS (BLADE) ×2
CANISTER SUC SOCK COL 7IN (MISCELLANEOUS) IMPLANT
CANISTER SUCT 1200ML W/VALVE (MISCELLANEOUS) IMPLANT
CHLORAPREP W/TINT 26 (MISCELLANEOUS) ×3 IMPLANT
CLIP APPLIE 9.375 MED OPEN (MISCELLANEOUS) IMPLANT
CLIP VESOCCLUDE SM WIDE 6/CT (CLIP) IMPLANT
CLOSURE WOUND 1/2 X4 (GAUZE/BANDAGES/DRESSINGS) ×1
COVER BACK TABLE REUSABLE LG (DRAPES) ×3 IMPLANT
COVER MAYO STAND REUSABLE (DRAPES) ×3 IMPLANT
COVER PROBE W GEL 5X96 (DRAPES) ×3 IMPLANT
COVER WAND RF STERILE (DRAPES) IMPLANT
DECANTER SPIKE VIAL GLASS SM (MISCELLANEOUS) IMPLANT
DERMABOND ADVANCED (GAUZE/BANDAGES/DRESSINGS) ×2
DERMABOND ADVANCED .7 DNX12 (GAUZE/BANDAGES/DRESSINGS) ×1 IMPLANT
DRAPE LAPAROSCOPIC ABDOMINAL (DRAPES) ×3 IMPLANT
DRAPE UTILITY XL STRL (DRAPES) ×3 IMPLANT
DRSG TEGADERM 4X4.75 (GAUZE/BANDAGES/DRESSINGS) IMPLANT
ELECT COATED BLADE 2.86 ST (ELECTRODE) ×3 IMPLANT
ELECT REM PT RETURN 9FT ADLT (ELECTROSURGICAL) ×3
ELECTRODE REM PT RTRN 9FT ADLT (ELECTROSURGICAL) ×1 IMPLANT
GAUZE SPONGE 4X4 12PLY STRL LF (GAUZE/BANDAGES/DRESSINGS) IMPLANT
GLOVE BIO SURGEON STRL SZ7 (GLOVE) ×6 IMPLANT
GLOVE BIOGEL PI IND STRL 7.5 (GLOVE) ×1 IMPLANT
GLOVE BIOGEL PI INDICATOR 7.5 (GLOVE) ×2
GOWN STRL REUS W/ TWL LRG LVL3 (GOWN DISPOSABLE) ×2 IMPLANT
GOWN STRL REUS W/TWL LRG LVL3 (GOWN DISPOSABLE) ×4
HEMOSTAT ARISTA ABSORB 3G PWDR (HEMOSTASIS) IMPLANT
ILLUMINATOR WAVEGUIDE N/F (MISCELLANEOUS) ×3 IMPLANT
KIT MARKER MARGIN INK (KITS) ×3 IMPLANT
LIGHT WAVEGUIDE WIDE FLAT (MISCELLANEOUS) IMPLANT
NEEDLE HYPO 25X1 1.5 SAFETY (NEEDLE) ×3 IMPLANT
NS IRRIG 1000ML POUR BTL (IV SOLUTION) IMPLANT
PACK BASIN DAY SURGERY FS (CUSTOM PROCEDURE TRAY) ×3 IMPLANT
PENCIL BUTTON HOLSTER BLD 10FT (ELECTRODE) ×3 IMPLANT
SLEEVE SCD COMPRESS KNEE MED (MISCELLANEOUS) ×3 IMPLANT
SPONGE LAP 4X18 RFD (DISPOSABLE) ×3 IMPLANT
STRIP CLOSURE SKIN 1/2X4 (GAUZE/BANDAGES/DRESSINGS) ×2 IMPLANT
SUT MNCRL AB 4-0 PS2 18 (SUTURE) IMPLANT
SUT MON AB 5-0 PS2 18 (SUTURE) IMPLANT
SUT SILK 2 0 SH (SUTURE) IMPLANT
SUT VIC AB 2-0 SH 27 (SUTURE) ×2
SUT VIC AB 2-0 SH 27XBRD (SUTURE) ×1 IMPLANT
SUT VIC AB 3-0 SH 27 (SUTURE) ×2
SUT VIC AB 3-0 SH 27X BRD (SUTURE) ×1 IMPLANT
SYR CONTROL 10ML LL (SYRINGE) ×3 IMPLANT
TOWEL GREEN STERILE FF (TOWEL DISPOSABLE) ×3 IMPLANT
TRAY FAXITRON CT DISP (TRAY / TRAY PROCEDURE) ×3 IMPLANT
TUBE CONNECTING 20'X1/4 (TUBING)
TUBE CONNECTING 20X1/4 (TUBING) IMPLANT
YANKAUER SUCT BULB TIP NO VENT (SUCTIONS) IMPLANT

## 2019-01-17 NOTE — Discharge Instructions (Signed)
[facility] °Office Phone Number [phone] ° °POST OP INSTRUCTIONS °Take 400 mg of ibuprofen every 8 hours or 650 mg tylenol every 6 hours for next 72 hours then as needed. Use ice several times daily also. °Always review your discharge instruction sheet given to you by the facility where your surgery was performed. ° °IF YOU HAVE DISABILITY OR FAMILY LEAVE FORMS, YOU MUST BRING THEM TO THE OFFICE FOR PROCESSING.  DO NOT GIVE THEM TO YOUR DOCTOR. ° °1. A prescription for pain medication may be given to you upon discharge.  Take your pain medication as prescribed, if needed.  If narcotic pain medicine is not needed, then you may take acetaminophen (Tylenol), naprosyn (Alleve) or ibuprofen (Advil) as needed. °2. Take your usually prescribed medications unless otherwise directed °3. If you need a refill on your pain medication, please contact your pharmacy.  They will contact our office to request authorization.  Prescriptions will not be filled after 5pm or on week-ends. °4. You should eat very light the first 24 hours after surgery, such as soup, crackers, pudding, etc.  Resume your normal diet the day after surgery. °5. Most patients will experience some swelling and bruising in the breast.  Ice packs and a good support bra will help.  Wear the breast binder provided or a sports bra for 72 hours day and night.  After that wear a sports bra during the day until you return to the office. Swelling and bruising can take several days to resolve.  °6. It is common to experience some constipation if taking pain medication after surgery.  Increasing fluid intake and taking a stool softener will usually help or prevent this problem from occurring.  A mild laxative (Milk of Magnesia or Miralax) should be taken according to package directions if there are no bowel movements after 48 hours. °7. Unless discharge instructions indicate otherwise, you may remove your bandages 48 hours after surgery and you may  shower at that time.  You may have steri-strips (small skin tapes) in place directly over the incision.  These strips should be left on the skin for 7-10 days and will come off on their own.  If your surgeon used skin glue on the incision, you may shower in 24 hours.  The glue will flake off over the next 2-3 weeks.  Any sutures or staples will be removed at the office during your follow-up visit. °8. ACTIVITIES:  You may resume regular daily activities (gradually increasing) beginning the next day.  Wearing a good support bra or sports bra minimizes pain and swelling.  You may have sexual intercourse when it is comfortable. °a. You may drive when you no longer are taking prescription pain medication, you can comfortably wear a seatbelt, and you can safely maneuver your car and apply brakes. °b. RETURN TO WORK:  ______________________________________________________________________________________ °9. You should see your doctor in the office for a follow-up appointment approximately two weeks after your surgery.  Your doctor’s nurse will typically make your follow-up appointment when she calls you with your pathology report.  Expect your pathology report 3-4 business days after your surgery.  You may call to check if you do not hear from us after three days. °10. OTHER INSTRUCTIONS: _______________________________________________________________________________________________ _____________________________________________________________________________________________________________________________________ °_____________________________________________________________________________________________________________________________________ °_____________________________________________________________________________________________________________________________________ ° °WHEN TO CALL DR WAKEFIELD: °1. Fever over 101.0 °2. Nausea and/or vomiting. °3. Extreme swelling or bruising. °4. Continued bleeding from  incision. °5. Increased pain, redness, or drainage from the incision. ° °The clinic staff is available to   answer your questions during regular business hours.  Please don’t hesitate to call and ask to speak to one of the nurses for clinical concerns.  If you have a medical emergency, go to the nearest emergency room or call 911.  A surgeon from [facility] is always on call at the hospital. ° °For further questions, please visit [email or web address] ° ° ° ° °Post Anesthesia Home Care Instructions ° °Activity: °Get plenty of rest for the remainder of the day. A responsible individual must stay with you for 24 hours following the procedure.  °For the next 24 hours, DO NOT: °-Drive a car °-Operate machinery °-Drink alcoholic beverages °-Take any medication unless instructed by your physician °-Make any legal decisions or sign important papers. ° °Meals: °Start with liquid foods such as gelatin or soup. Progress to regular foods as tolerated. Avoid greasy, spicy, heavy foods. If nausea and/or vomiting occur, drink only clear liquids until the nausea and/or vomiting subsides. Call your physician if vomiting continues. ° °Special Instructions/Symptoms: °Your throat may feel dry or sore from the anesthesia or the breathing tube placed in your throat during surgery. If this causes discomfort, gargle with warm salt water. The discomfort should disappear within 24 hours. ° °If you had a scopolamine patch placed behind your ear for the management of post- operative nausea and/or vomiting: ° °1. The medication in the patch is effective for 72 hours, after which it should be removed.  Wrap patch in a tissue and discard in the trash. Wash hands thoroughly with soap and water. °2. You may remove the patch earlier than 72 hours if you experience unpleasant side effects which may include dry mouth, dizziness or visual disturbances. °3. Avoid touching the patch. Wash your hands with soap and water after contact  with the patch. °   ° °

## 2019-01-17 NOTE — Anesthesia Procedure Notes (Signed)
Procedure Name: LMA Insertion Date/Time: 01/17/2019 12:35 PM Performed by: Signe Colt, CRNA Pre-anesthesia Checklist: Patient identified, Emergency Drugs available, Suction available and Patient being monitored Patient Re-evaluated:Patient Re-evaluated prior to induction Oxygen Delivery Method: Circle system utilized Preoxygenation: Pre-oxygenation with 100% oxygen Induction Type: IV induction Ventilation: Mask ventilation without difficulty LMA: LMA inserted LMA Size: 4.0 Number of attempts: 1 Airway Equipment and Method: Bite block Placement Confirmation: positive ETCO2 Tube secured with: Tape Dental Injury: Teeth and Oropharynx as per pre-operative assessment

## 2019-01-17 NOTE — Transfer of Care (Signed)
Immediate Anesthesia Transfer of Care Note  Patient: Mariah Haley  Procedure(s) Performed: RADIOACTIVE SEED GUIDED LEFT BREAST LUMPECTOMY (Left Breast)  Patient Location: PACU  Anesthesia Type:General  Level of Consciousness: awake, alert , oriented and patient cooperative  Airway & Oxygen Therapy: Patient Spontanous Breathing and Patient connected to face mask oxygen  Post-op Assessment: Report given to RN and Post -op Vital signs reviewed and stable  Post vital signs: Reviewed and stable  Last Vitals:  Vitals Value Taken Time  BP 116/62 01/17/19 1320  Temp    Pulse 113 01/17/19 1322  Resp 20 01/17/19 1322  SpO2 98 % 01/17/19 1322  Vitals shown include unvalidated device data.  Last Pain:  Vitals:   01/17/19 1127  TempSrc: Oral      Patients Stated Pain Goal: 3 ([id number])  Complications: No apparent anesthesia complications

## 2019-01-17 NOTE — Anesthesia Preprocedure Evaluation (Signed)
Anesthesia Evaluation  Patient identified by MRN, date of birth, ID band Patient awake    Airway Mallampati: I       Dental no notable dental hx. (+) Teeth Intact   Pulmonary neg pulmonary ROS,    Pulmonary exam normal breath sounds clear to auscultation       Cardiovascular negative cardio ROS Normal cardiovascular exam Rhythm:Regular Rate:Normal     Neuro/Psych negative neurological ROS  negative psych ROS   GI/Hepatic negative GI ROS, Neg liver ROS,   Endo/Other  negative endocrine ROS  Renal/GU negative Renal ROS  negative genitourinary   Musculoskeletal negative musculoskeletal ROS (+)   Abdominal Normal abdominal exam  (+)   Peds negative pediatric ROS (+)  Hematology negative hematology ROS (+)   Anesthesia Other Findings   Reproductive/Obstetrics                             Anesthesia Physical Anesthesia Plan  ASA: I  Anesthesia Plan: General   Post-op Pain Management:    Induction: Intravenous  PONV Risk Score and Plan: 3 and Ondansetron, Dexamethasone and Midazolam  Airway Management Planned: LMA  Additional Equipment: None  Intra-op Plan:   Post-operative Plan: Extubation in OR  Informed Consent: I have reviewed the patients History and Physical, chart, labs and discussed the procedure including the risks, benefits and alternatives for the proposed anesthesia with the patient or authorized representative who has indicated his/her understanding and acceptance.     Dental advisory given  Plan Discussed with: CRNA  Anesthesia Plan Comments:         Anesthesia Quick Evaluation

## 2019-01-17 NOTE — H&P (Signed)
  58 yof I saw previously for left breast alh she has no mass or dc on self exam. she has no fh or personal history of breast disease. she was noted on screening mm to have c density breasts. she had a left breast distortion noted. the right is negative. additional views showed an asymmetry in the medial left breast with a few associated microcalcs. [place] is negative and shows c dense tissue. biopsy was done and clip apparently does not reside at the intended target. core result shows alh with pash. this is concordant. I discussed with radiology at the time and they felt the appropriate area was biopsied and that we could follow her. I agreed in accordance with ASBS and our local guidelines for [facility] she then returned to get six month follow up and the next radiologist thought the area needed to be sampled again. she still has the asymmetry. the clip was 2 cm medial and we were aware of that. she is here again due to this  Past Surgical History  Colon Polyp Removal - Colonoscopy   Diagnostic Studies History  Colonoscopy  1-5 years ago Mammogram  within last year Pap Smear  1-5 years ago  Allergies  No Known Drug Allergies  Allergies Reconciled   Medication History No Current Medications Medications Reconciled  Social History Alcohol use  Occasional alcohol use. Caffeine use  Carbonated beverages, Coffee, Tea. No drug use  Tobacco use  Never smoker.  Family History  Heart Disease  Father, Mother. Heart disease in female family member before age 47  Migraine Headache  Mother, Sister.  ROS negative  Physical Exam  General Mental Status-Alert. Orientation-Oriented X3. Breast Nipples-No Discharge. Breast Lump-No Palpable Breast Mass. Lymphatic Head & Neck General Head & Neck Lymphatics: Bilateral - Description - Normal. Axillary Generl Axillary Region: Bilateral - Description - Normal. 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 unfortunately she continues to have biopsies and due to this she would like to consider excision which I dont think unreasonable. we again discussed meaning of alh today and low likelihood of another lesion at this site. we discussed surgery, seed placement, risks and recovery as well as preop covid testing.

## 2019-01-17 NOTE — Anesthesia Postprocedure Evaluation (Signed)
Anesthesia Post Note  Patient: Mariah Haley  Procedure(s) Performed: RADIOACTIVE SEED GUIDED LEFT BREAST LUMPECTOMY (Left Breast)     Patient location during evaluation: PACU Anesthesia Type: General Level of consciousness: awake Pain management: pain level controlled Vital Signs Assessment: post-procedure vital signs reviewed and stable Respiratory status: spontaneous breathing Cardiovascular status: stable Postop Assessment: no apparent nausea or vomiting Anesthetic complications: no    Last Vitals:  Vitals:   01/17/19 1345 01/17/19 1356  BP: 114/67 119/66  Pulse: 96 79  Resp: 17 16  Temp: 36.4 C 36.4 C  SpO2: 100% 100%    Last Pain:  Vitals:   01/17/19 1400  TempSrc:   PainSc: 1    Pain Goal: Patients Stated Pain Goal: 3 (01/17/19 1127)                 Huston Foley

## 2019-01-17 NOTE — Interval H&P Note (Signed)
History and Physical Interval Note:  01/17/2019 11:54 AM  Mariah Haley  has presented today for surgery, with the diagnosis of left breast alh.  The various methods of treatment have been discussed with the patient and family. After consideration of risks, benefits and other options for treatment, the patient has consented to  Procedure(s): RADIOACTIVE SEED GUIDED EXCISIONAL LEFT  BREAST BIOPSY (Left) as a surgical intervention.  The patient's history has been reviewed, patient examined, no change in status, stable for surgery.  I have reviewed the patient's chart and labs.  Questions were answered to the patient's satisfaction.     Rolm Bookbinder

## 2019-01-17 NOTE — Op Note (Signed)
Preoperative diagnosis: Left breast atypical lobular hyperplasia on core biopsy  Postoperative diagnosis: Same as above Procedure: Left breast seed guided excisional biopsy Surgeon: Dr. Serita Grammes Anesthesia: General Specimens: Left breast tissue containing seed and clip Drains: None Complications: None Sponge and needle count correct at completion Dispo recovery stable  Indications: This is a 15 yof who has prior biopsy that was alh and then underwent second biopsy due to persistent distortion that was alh. Due to concern she just wanted this excised.  I had radiology localize the x clip at the distortion as the other was 2 cm away.    Procedure: After informed consent was obtained the patient first had the seed placed.  I had these mammograms available in the operating room.  She then underwent general anesthesia without complication.  She was prepped and draped in the standard sterile surgical fashion.  A surgical timeout was then performed.  I infiltrated Marcaine throughout the central and upper breast. I then made a periareolar incision to hide the scar later.  I then used the cautery and the lighted retractor to tunnel to the seed.  I removed the seed and the surrounding tissue.  This was then passed off the table after being marked with paint.  The mammogram confirmed removal of the seed and the clip.  Hemostasis was obtained tissue I closed the tissue with 2-0 Vicryl.  Skin was closed with 3-0 Vicryl 5-0 Monocryl.  Glue was placed.  She tolerated this well was extubated and transferred to recovery stable.

## 2019-01-18 ENCOUNTER — Encounter (HOSPITAL_BASED_OUTPATIENT_CLINIC_OR_DEPARTMENT_OTHER): Payer: Self-pay | Admitting: General Surgery

## 2019-02-01 ENCOUNTER — Other Ambulatory Visit: Payer: Self-pay | Admitting: General Surgery

## 2019-02-01 DIAGNOSIS — Z1231 Encounter for screening mammogram for malignant neoplasm of breast: Secondary | ICD-10-CM

## 2019-02-07 ENCOUNTER — Telehealth: Payer: Self-pay | Admitting: Oncology

## 2019-02-07 NOTE — Telephone Encounter (Signed)
Received a new patient referral from Dr. Donne Hazel for the high risk breast clinic. Mariah Haley has been cld and scheduled to see Dr. Jana Hakim on 9/28 at 4pm. She's aware to arrive 15 minutes early.

## 2019-02-18 NOTE — Progress Notes (Signed)
[facility]  [phone] [phone]    ID: Mariah Haley DOB: April 27, 1961  MR#: [id number]  [id number]  Patient Care Team: Fortino Sic, PA as PCP - General (Family Medicine) Prescious Hurless, Virgie Dad, MD as Consulting Physician (Oncology) Rolm Bookbinder, MD as Consulting Physician (General Surgery) OTHER MD:   CHIEF COMPLAINT: Lobular carcinoma in situ; breast cancer high risk  CURRENT TREATMENT: Tamoxifen; intensified screening   HISTORY OF CURRENT ILLNESS: "Mariah Haley" [place] had routine screening mammography on 03/07/2018 showing a possible abnormality in the left breast. She underwent unilateral left diagnostic mammography with tomography and left breast ultrasonography at [facility] on 03/14/2018 showing: Breast Density Category C. Additional mammographic views of the left breast demonstrate no reproducible distortion in the medial left breast, posterior depth. There is however an asymmetry with a few associated microcalcifications in the medial left breast, posterior depth, seen on both spot compression craniocaudal tomosynthesis views, image 41/69 and image 37/64. On physical exam, no suspicious masses are palpated. Targeted ultrasound is performed, showing dense breast parenchyma, but no sonographically identifiable mass. There is no evidence of left axillary lymphadenopathy.  Accordingly on 03/20/2018 she proceeded to biopsy of the left breast area in question. The pathology from this procedure showed [id number]):  Breast, left, needle core biopsy, medial - lobular neoplasia (atypical lobular hyperplasia). - fibrocystic changes with calcifications.   - pseudoangiomatous stromal hyperplasia (pash).  She underwent repeat unilateral left diagnostic mammography with tomography at [facility] on 11/10/2018 showing: Breast Density Category C. Indeterminate left breast asymmetry as originally identified on  the patient's screening and diagnostic workup. A biopsy clip at the site of patient's known atypical lobular hyperplasia is approximately 2 cm medial to this area.  On 11/20/2018 she proceeded to biopsy of the left breast area in question. The pathology from this procedure showed [id number]): Breast, left, needle core biopsy, uiq, posterior - lobular neoplasia (atypical lobular hyperplasia). - fibrocystic changes with calcifications.   - pseudoangiomatous stromal hyperplasia (pash).  Then, on 01/17/2019, she underwent a left lumpectomy. The pathology from this procedure showed [id number]):  Breast, lumpectomy, Left w/seed   - lobular carcinoma in situ  The patient's subsequent history is as detailed below.   INTERVAL HISTORY: Mariah Haley was evaluated in the multidisciplinary breast cancer clinic on 02/19/2019.   REVIEW OF SYSTEMS: Mariah Haley did well with her biopsies and surgery.  She had no unusual fever, bleeding, or pain problems.  She denies unusual headaches, visual changes, nausea, vomiting, stiff neck, dizziness, or gait imbalance. There has been no cough, phlegm production, or pleurisy, no chest pain or pressure, and no change in bowel or bladder habits. The patient denies fever, rash, bleeding, unexplained fatigue or unexplained weight loss.  She used to go to the gym but currently is not exercising regularly.  A detailed review of systems was otherwise entirely negative.    PAST MEDICAL HISTORY: Hypercholesterolemia, Gilbert's condition  PAST SURGICAL HISTORY: Past Surgical History:  Procedure Laterality Date  . DILATION AND CURETTAGE OF UTERUS    . RADIOACTIVE SEED GUIDED EXCISIONAL BREAST BIOPSY Left 01/17/2019   Procedure: RADIOACTIVE SEED GUIDED LEFT BREAST LUMPECTOMY;  Surgeon: Rolm Bookbinder, MD;  Location: [facility];  Service: General;  Laterality: Left;     FAMILY HISTORY: No family history on file. Mariah Haley's father died at the age of 7 from heart  problems (first heart attack age 4). Patients' mother is 20 years old as of September 2020. The patient has  1 brothers and 2 sisters. Patient denies anyone in her family having breast, ovarian, prostate, or pancreatic cancer.  There are some lung cancers in the family, all of them smokers   GYNECOLOGIC HISTORY:  Patient's last menstrual period was 02/10/2011. 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  Her husband Mariah "Shanon Brow" is a former Arboriculturist, now retired.  Daughter Mariah Haley, 39, is a Licensed conveyancer at [employer] in [place]; daughter Mariah Haley is an Armed forces technical officer in [place]; Dr. Barnetta Chapel is getting it certified social work degree in [place].  No grandchildren.  The patient is a [employer]   ADVANCED DIRECTIVES: In the absence of documents to the contrary the patient's husband is her healthcare power of attorney   HEALTH MAINTENANCE: Social History   Tobacco Use  . Smoking status: Never Smoker  . Smokeless tobacco: Never Used  Substance Use Topics  . Alcohol use: Yes    Comment: rare  . Drug use: Never    Colonoscopy: 2019/[facility]  PAP: 2018/ [facility]  Bone density: no   No Known Allergies  Current Outpatient Medications  Medication Sig Dispense Refill  . LIVALO 2 MG TABS     . valACYclovir (VALTREX) 500 MG tablet      No current facility-administered medications for this visit.      OBJECTIVE: Middle-aged white woman in no acute distress  Vitals:   02/19/19 1600  BP: 128/71  Pulse: 97  Resp: 18  Temp: 98.2 F (36.8 C)  SpO2: 99%   Wt Readings from Last 3 Encounters:  02/19/19 189 lb 6.4 oz (85.9 kg)  01/17/19 189 lb 13.1 oz (86.1 kg)   Body mass index is 31.04 kg/m.    ECOG FS:1 -  Symptomatic but completely ambulatory  Ocular: Sclerae unicteric, pupils round and equal Ear-nose-throat: Oropharynx clear and moist Lymphatic: No cervical or supraclavicular adenopathy Lungs no rales or rhonchi Heart regular rate and rhythm Abd soft, nontender, positive bowel sounds MSK no focal spinal tenderness, no joint edema Neuro: non-focal, well-oriented, appropriate affect Breasts: The right breast is unremarkable.  The left breast is status post recent lumpectomy.  The cosmetic result is excellent.  There are no suspicious skin or nipple changes.  Both axillae are benign.   LAB RESULTS:  CMP  No results found for: NA, K, CL, CO2, GLUCOSE, BUN, CREATININE, CALCIUM, PROT, ALBUMIN, AST, ALT, ALKPHOS, BILITOT, GFRNONAA, GFRAA  No results found for: TOTALPROTELP, ALBUMINELP, A1GS, A2GS, BETS, BETA2SER, GAMS, MSPIKE, SPEI  No results found for: KPAFRELGTCHN, LAMBDASER, KAPLAMBRATIO  No results found for: WBC, NEUTROABS, HGB, HCT, MCV, PLT  No results found for: LABCA2  No components found for: [id number]  No results for input(s): INR in the last 168 hours.  No results found for: LABCA2  No results found for: [id number]  No results found for: [id number]  No results found for: [id number]  No results found for: CA2729  No components found for: HGQUANT  No results found for: CEA1 / No results found for: CEA1   No results found for: AFPTUMOR  No results found for: CHROMOGRNA  No results found for: PSA1  No visits with results within 3 Day(s) from this visit.  Latest known visit with results is:  [facility]  Outpatient Visit on 01/13/2019  Component Date Value Ref Range Status  . SARS Coronavirus 2 01/13/2019 NEGATIVE  NEGATIVE Final   Comment: (NOTE) SARS-CoV-2 target nucleic acids are NOT DETECTED. The SARS-CoV-2 RNA is generally detectable in upper and lower respiratory specimens during the acute phase of infection. Negative results do not preclude SARS-CoV-2 infection, do  not rule out co-infections with other pathogens, and should not be used as the sole basis for treatment or other patient management decisions. Negative results must be combined with clinical observations, patient history, and epidemiological information. The expected result is Negative. Fact Sheet for Patients: SugarRoll.be Fact Sheet for Healthcare Providers: https://www.woods-mathews.com/ This test is not yet approved or cleared by the [place] FDA and  has been authorized for detection and/or diagnosis of SARS-CoV-2 by FDA under an Emergency Use Authorization (EUA). This EUA will remain  in effect (meaning this test can be used) for the duration of the COVID-19 declaration under Section 56                          4(b)(1) of the Act, 21 U.S.C. section 360bbb-3(b)(1), unless the authorization is terminated or revoked sooner. Performed at [facility], [place] [address] [place], [place] [address]     (this displays the last labs from the last 3 days)  No results found for: TOTALPROTELP, ALBUMINELP, A1GS, A2GS, BETS, BETA2SER, GAMS, MSPIKE, SPEI (this displays SPEP labs)  No results found for: KPAFRELGTCHN, LAMBDASER, KAPLAMBRATIO (kappa/lambda light chains)  No results found for: HGBA, HGBA2QUANT, HGBFQUANT, HGBSQUAN (Hemoglobinopathy evaluation)   No results found for: LDH  No results found for: IRON, TIBC, IRONPCTSAT (Iron and TIBC)  No results found for: FERRITIN  Urinalysis No results found for: COLORURINE, APPEARANCEUR, LABSPEC, PHURINE, GLUCOSEU, HGBUR, BILIRUBINUR, KETONESUR, PROTEINUR, UROBILINOGEN, NITRITE, LEUKOCYTESUR   STUDIES:  Mammographic studies reviewed with the patient  ELIGIBLE FOR AVAILABLE RESEARCH PROTOCOL: no   ASSESSMENT: 58 y.o. [place], [place] woman status post left breast biopsy 03/20/2018 and 11/20/2018 showing atypical lobular hyperplasia  (1) status post left lumpectomy 01/17/2019  showing lobular carcinoma in situ  (2) breast cancer high risk (30 to 50% lifetime risk)  (a) risk reduction: Tamoxifen started 02/22/2019  (b) intensified screening:   Breast MRI DEC   Breast Mammography JUN   PLAN: We spent the better part of today's 50-minute appointment discussing the biology of breast cancer in general, and the specifics of atypical lobular hyperplasia [ALH] and lobular carcinoma in situ [LCIS] more specifically. Mariah Haley understands ALH means, first, relatively unrestricted growth of glandular breast cells and, in addition, some morphologically different features from simple hyperplasia. Atypical lobular hyperplasia is not breast cancer but can be associated with invasive cancer and for that reason lumpectomy was necessary.  Lobular carcinoma in situ means the cancer cells are not merrily atypical but actually cancerous, and yet they are trapped in the lobule (milk gland) where they started.  Like ALH, LCIS H is a marker of breast cancer risk and it places Mariah Haley in the 30-50% breast cancer risk category  Mariah Haley has essentially two ways of lowering that risk. One of them is bilateral mastectomies.  This would involve major surgery, likely requiring reconstruction, which may or may not be successful in the patient's eyes..  It may or may not be covered by her insurance.  More importantly in this setting there is no evidence of a survival advantage with this approach.  This is discouraged  The other,  more reasonable risk reduction approach would be to take anti-estrogens for 5 years.  Options include tamoxifen, raloxifene/Evista, or an aromatase inhibitor. Any of these agents if taken for 5 years would cut the risk of developing a new breast cancer in half.  We then discussed the possible toxicities, side effects and complications of the tamoxifen group as opposed to the aromatase inhibitors.  This information was given to [facility] in writing.  She is interested in tamoxifen and she is a  good candidate for it because she took systemic estrogens for many years with no clotting issues.  In addition she may be interested in continuing vaginal estrogens and that would not be possible under aromatase inhibitors.    We then discussed intensified screening. This would mean adding a yearly MRI to yearly mammography with tomography. This approach greatly increases sensitivity. Any breast cancer that may develop should be found at the earliest possible stage.  The main concern with this approach is the possibility of "false positives " requiring repeated studies, biopsies, etc.  Also there are issues regarding cost even if partial insurance coverage is obtained.  Mariah Haley is very interested in proceeding with this approach, which she also discussed with Dr. Donne Hazel previously.  Finally, Mariah Haley can reduce her risk of cancer in general (not breast cancer specifically) to a small extent by exercising 45 minutes at least 5 times a week, and keeping a diet high in vegetables and low in carbohydrates.  She was encouraged to consider an exercise and diet program.  After this discussion Mariah Haley would like to proceed with tamoxifen and I have placed that prescription in for her.  She can start vaginal estrogens unde rcover of tamoxifen if she wishes and we will be glad to facilitate that for her if she makes that decision.  She will be set up for an MRI of the breast in December 2020 and she will be seeing Dr. Dion Saucier in January. Accordingly she will return to see me in July 2021, after her June 2021 mammography.  I am also making her an optional visit in early November to fine-tune any problems she may be having from the tamoxifen.  Mariah Haley has a good understanding of this plan.  She agrees with it.  She knows the goal of treatment in this case is prevention.  She will call with any other issues that may develop before her next visit here.   Rayven Rettig, Virgie Dad, MD  02/19/19 5:09 PM Medical Oncology and Hematology  [facility] [place], [place] [address] Tel. [phone]    Fax. [phone]   I, Jacqualyn Posey am acting as a Education administrator for Chauncey Cruel, MD.   I, Lurline Del MD, have reviewed the above documentation for accuracy and completeness, and I agree with the above.

## 2019-02-19 ENCOUNTER — Inpatient Hospital Stay: Payer: BC Managed Care – PPO | Attending: Oncology | Admitting: Oncology

## 2019-02-19 ENCOUNTER — Other Ambulatory Visit: Payer: Self-pay

## 2019-02-19 DIAGNOSIS — E7800 Pure hypercholesterolemia, unspecified: Secondary | ICD-10-CM | POA: Insufficient documentation

## 2019-02-19 DIAGNOSIS — Z170 Estrogen receptor positive status [ER+]: Secondary | ICD-10-CM | POA: Insufficient documentation

## 2019-02-19 DIAGNOSIS — Z79810 Long term (current) use of selective estrogen receptor modulators (SERMs): Secondary | ICD-10-CM | POA: Insufficient documentation

## 2019-02-19 DIAGNOSIS — Z801 Family history of malignant neoplasm of trachea, bronchus and lung: Secondary | ICD-10-CM | POA: Insufficient documentation

## 2019-02-19 DIAGNOSIS — Z1231 Encounter for screening mammogram for malignant neoplasm of breast: Secondary | ICD-10-CM

## 2019-02-19 DIAGNOSIS — D0502 Lobular carcinoma in situ of left breast: Secondary | ICD-10-CM | POA: Diagnosis present

## 2019-02-19 DIAGNOSIS — Z8249 Family history of ischemic heart disease and other diseases of the circulatory system: Secondary | ICD-10-CM

## 2019-02-19 DIAGNOSIS — Z1239 Encounter for other screening for malignant neoplasm of breast: Secondary | ICD-10-CM

## 2019-02-19 DIAGNOSIS — N6092 Unspecified benign mammary dysplasia of left breast: Secondary | ICD-10-CM | POA: Diagnosis not present

## 2019-02-19 DIAGNOSIS — D0500 Lobular carcinoma in situ of unspecified breast: Secondary | ICD-10-CM | POA: Insufficient documentation

## 2019-02-19 MED ORDER — TAMOXIFEN CITRATE 20 MG PO TABS: 20.0000 mg | ORAL_TABLET | Freq: Every day | ORAL | 12 refills | Status: DC

## 2019-02-20 ENCOUNTER — Other Ambulatory Visit: Payer: Self-pay | Admitting: *Deleted

## 2019-02-20 ENCOUNTER — Telehealth: Payer: Self-pay | Admitting: *Deleted

## 2019-02-20 MED ORDER — TAMOXIFEN CITRATE 20 MG PO TABS: 20.0000 mg | ORAL_TABLET | Freq: Every day | ORAL | 12 refills | Status: AC

## 2019-02-20 NOTE — Telephone Encounter (Signed)
"  Eriyonna Bielec [place] [phone]).    Need to change an appointment time.    I have [facility] so [facility] denied yesterday's orders.  Need new order sent to [facility]."  Updated pharmacy information.

## 2019-02-21 ENCOUNTER — Telehealth: Payer: Self-pay | Admitting: Oncology

## 2019-02-21 NOTE — Telephone Encounter (Signed)
I talk with patient regarding schedule  

## 2019-03-28 ENCOUNTER — Other Ambulatory Visit: Payer: Self-pay

## 2019-03-28 ENCOUNTER — Inpatient Hospital Stay: Payer: BC Managed Care – PPO | Attending: Oncology | Admitting: Adult Health

## 2019-03-28 ENCOUNTER — Encounter: Payer: Self-pay | Admitting: Adult Health

## 2019-03-28 VITALS — BP 143/95 | HR 92 | Temp 97.8°F | Resp 18 | Ht 65.5 in | Wt 189.0 lb

## 2019-03-28 DIAGNOSIS — Z79810 Long term (current) use of selective estrogen receptor modulators (SERMs): Secondary | ICD-10-CM | POA: Insufficient documentation

## 2019-03-28 DIAGNOSIS — D0502 Lobular carcinoma in situ of left breast: Secondary | ICD-10-CM | POA: Diagnosis present

## 2019-03-28 DIAGNOSIS — R232 Flushing: Secondary | ICD-10-CM | POA: Insufficient documentation

## 2019-03-28 DIAGNOSIS — E7800 Pure hypercholesterolemia, unspecified: Secondary | ICD-10-CM | POA: Insufficient documentation

## 2019-03-28 DIAGNOSIS — N6092 Unspecified benign mammary dysplasia of left breast: Secondary | ICD-10-CM

## 2019-03-28 DIAGNOSIS — Z79899 Other long term (current) drug therapy: Secondary | ICD-10-CM | POA: Diagnosis not present

## 2019-03-28 DIAGNOSIS — Z170 Estrogen receptor positive status [ER+]: Secondary | ICD-10-CM | POA: Insufficient documentation

## 2019-03-28 NOTE — Progress Notes (Signed)
[facility]  [phone] Fax:(336) [phone]    ID: Mariah Haley DOB: 07/11/1960  MR#: [id number]  [id number]  Patient Care Team: Fortino Sic, PA as PCP - General (Family Medicine) Magrinat, Virgie Dad, MD as Consulting Physician (Oncology) Rolm Bookbinder, MD as Consulting Physician (General Surgery) Hale Bogus., MD as Referring Physician (Gastroenterology) Jonathon Resides, MD as Consulting Physician (Family Medicine) OTHER MD:   CHIEF COMPLAINT: Lobular carcinoma in situ; breast cancer high risk  CURRENT TREATMENT: Tamoxifen; intensified screening   HISTORY OF CURRENT ILLNESS: "Mariah Haley" Mariah Haley had routine screening mammography on 03/07/2018 showing a possible abnormality in the left breast. She underwent unilateral left diagnostic mammography with tomography and left breast ultrasonography at [facility] on 03/14/2018 showing: Breast Density Category C. Additional mammographic views of the left breast demonstrate no reproducible distortion in the medial left breast, posterior depth. There is however an asymmetry with a few associated microcalcifications in the medial left breast, posterior depth, seen on both spot compression craniocaudal tomosynthesis views, image 41/69 and image 37/64. On physical exam, no suspicious masses are palpated. Targeted ultrasound is performed, showing dense breast parenchyma, but no sonographically identifiable mass. There is no evidence of left axillary lymphadenopathy.  Accordingly on 03/20/2018 she proceeded to biopsy of the left breast area in question. The pathology from this procedure showed [id number]):  Breast, left, needle core biopsy, medial - lobular neoplasia (atypical lobular hyperplasia). - fibrocystic changes with calcifications.   - pseudoangiomatous stromal hyperplasia (pash).  She underwent repeat unilateral left diagnostic mammography with tomography at [facility] on 11/10/2018 showing: Breast Density Category C. Indeterminate left breast asymmetry as originally identified on the patient's screening and diagnostic workup. A biopsy clip at the site of patient's known atypical lobular hyperplasia is approximately 2 cm medial to this area.  On 11/20/2018 she proceeded to biopsy of the left breast area in question. The pathology from this procedure showed [id number]): Breast, left, needle core biopsy, uiq, posterior - lobular neoplasia (atypical lobular hyperplasia). - fibrocystic changes with calcifications.   - pseudoangiomatous stromal hyperplasia (pash).  Then, on 01/17/2019, she underwent a left lumpectomy. The pathology from this procedure showed [id number]):  Breast, lumpectomy, Left w/seed   - lobular carcinoma in situ  The patient's subsequent history is as detailed below.   INTERVAL HISTORY: Mariah Haley is here for follow up of her LCIS, [place], having recently started on Tamoxifen therapy a couple of months ago.  She is tolerating it well thus far, other than some mild tolerable hot flashes.  She denies vaginal discharge or arthralgias.    REVIEW OF SYSTEMS: Mariah Haley is doing well today.  She has some questions about tamoxifen and the side effects.  She notes she is feeling well today.  She is retired and lives in [place], [place], but travels to [place] frequently to take care of her aging mom who is living alone in a home there.  She is up to date with her PCP f/u, and notes her next appointment in in 05/2019.  She does not yet have MRI breast scheduled in 04/2019 as ordered.    Otherwise, Mariah Haley is well and a detailed ROS Was non contributory.     PAST MEDICAL HISTORY: Hypercholesterolemia, Gilbert's condition  PAST SURGICAL HISTORY: Past Surgical History:  Procedure Laterality Date  . DILATION AND CURETTAGE OF UTERUS    . RADIOACTIVE SEED GUIDED EXCISIONAL BREAST BIOPSY Left 01/17/2019   Procedure: RADIOACTIVE SEED GUIDED LEFT BREAST  LUMPECTOMY;  Surgeon: Rolm Bookbinder, MD;  Location: [facility];  Service: General;  Laterality: Left;     FAMILY HISTORY: History reviewed. No pertinent family history. Mariah Haley's father died at the age of 22 from heart problems (first heart attack age 57). Patients' mother is 34 years old as of September 2020. The patient has 1 brothers and 2 sisters. Patient denies anyone in her family having breast, ovarian, prostate, or pancreatic cancer.  There are some lung cancers in the family, all of them smokers   GYNECOLOGIC HISTORY:  Patient's last menstrual period was 02/10/2011. 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  Her husband Mariah "Shanon Brow" is a former Arboriculturist, now retired.  Daughter Mariah Haley, 50, is a Licensed conveyancer at [employer] in [place]; daughter Mariah Haley is an Armed forces technical officer in [place]; Dr. Barnetta Chapel is getting it certified social work degree in [place].  No grandchildren.  The patient is a [employer]   ADVANCED DIRECTIVES: In the absence of documents to the contrary the patient's husband is her healthcare power of attorney   HEALTH MAINTENANCE: Social History   Tobacco Use  . Smoking status: Never Smoker  . Smokeless tobacco: Never Used  Substance Use Topics  . Alcohol use: Yes    Comment: rare  . Drug use: Never    Colonoscopy: 2019/[facility]  PAP: 2018/ [facility]  Bone density: no   No Known Allergies  Current Outpatient Medications  Medication Sig Dispense Refill  . LIVALO 2 MG TABS     . tamoxifen (NOLVADEX) 20 MG tablet Take 20 mg by mouth daily.    . valACYclovir (VALTREX) 500 MG tablet      No current facility-administered medications for this visit.      OBJECTIVE:    Vitals:   03/28/19 0953  BP: (!) 143/95  Pulse: 92  Resp: 18  Temp: 97.8 F (36.6 C)  SpO2: 99%   Wt Readings from Last 3 Encounters:  03/28/19 189 lb (85.7 kg)  02/19/19 189 lb 6.4 oz (85.9 kg)  01/17/19 189 lb 13.1 oz (86.1 kg)   Body mass index is 30.97 kg/m.    ECOG FS:1 - Symptomatic but completely ambulatory GENERAL: Patient is a well appearing female in no acute distress HEENT:  Sclerae anicteric.  Oropharynx clear and moist. No ulcerations or evidence of oropharyngeal candidiasis. Neck is supple.  NODES:  No cervical, supraclavicular, or axillary lymphadenopathy palpated.  BREAST EXAM:  Left breast s/p lumpectomy, benign, right breast benign LUNGS:  Clear to auscultation bilaterally.  No wheezes or rhonchi. HEART:  Regular rate and rhythm. No murmur appreciated. ABDOMEN:  Soft, nontender.  Positive, normoactive bowel sounds. No organomegaly palpated. MSK:  No focal spinal tenderness to palpation. Full range of motion bilaterally in the upper extremities. EXTREMITIES:  No peripheral edema.   SKIN:  Clear with no obvious rashes or skin changes. No nail dyscrasia. NEURO:  Nonfocal. Well oriented.  Appropriate affect.   LAB RESULTS:  CMP  No results found for: NA, K, CL, CO2, GLUCOSE, BUN, CREATININE, CALCIUM, PROT, ALBUMIN, AST, ALT, ALKPHOS, BILITOT, GFRNONAA, GFRAA  No results found for: TOTALPROTELP, ALBUMINELP, A1GS, A2GS, BETS, BETA2SER, GAMS, MSPIKE, SPEI  No results found for: KPAFRELGTCHN, LAMBDASER, KAPLAMBRATIO  No results found for: WBC, NEUTROABS, HGB, HCT, MCV, PLT  No results found for: LABCA2  No components found for: [id number]  No results for input(s): INR in the last 168 hours.  No results found for: LABCA2  No results found for: [id number]  No results found for: [id number]  No results found for: [id number]  No results found for: CA2729  No components found for: HGQUANT  No results found for: CEA1 / No results found for: CEA1   No results found  for: AFPTUMOR  No results found for: CHROMOGRNA  No results found for: PSA1  No visits with results within 3 Day(s) from this visit.  Latest known visit with results is:  Hospital Outpatient Visit on 01/13/2019  Component Date Value Ref Range Status  . SARS Coronavirus 2 01/13/2019 NEGATIVE  NEGATIVE Final   Comment: (NOTE) SARS-CoV-2 target nucleic acids are NOT DETECTED. The SARS-CoV-2 RNA is generally detectable in upper and lower respiratory specimens during the acute phase of infection. Negative results do not preclude SARS-CoV-2 infection, do not rule out co-infections with other pathogens, and should not be used as the sole basis for treatment or other patient management decisions. Negative results must be combined with clinical observations, patient history, and epidemiological information. The expected result is Negative. Fact Sheet for Patients: SugarRoll.be Fact Sheet for Healthcare Providers: https://www.woods-mathews.com/ This test is not yet approved or cleared by the [place] FDA and  has been authorized for detection and/or diagnosis of SARS-CoV-2 by FDA under an Emergency Use Authorization (EUA). This EUA will remain  in effect (meaning this test can be used) for the duration of the COVID-19 declaration under Section 56                          4(b)(1) of the Act, 21 U.S.C. section 360bbb-3(b)(1), unless the authorization is terminated or revoked sooner. Performed at [facility], [place] [address] [place], [place] [address]     (this displays the last labs from the last 3 days)  No results found for: TOTALPROTELP, ALBUMINELP, A1GS, A2GS, BETS, BETA2SER, GAMS, MSPIKE, SPEI (this displays SPEP labs)  No results found for: KPAFRELGTCHN, LAMBDASER, KAPLAMBRATIO (kappa/lambda light chains)  No results found for: HGBA, HGBA2QUANT, HGBFQUANT, HGBSQUAN (Hemoglobinopathy evaluation)   No results found for: LDH   No results found for: IRON, TIBC, IRONPCTSAT (Iron and TIBC)  No results found for: FERRITIN  Urinalysis No results found for: COLORURINE, APPEARANCEUR, LABSPEC, PHURINE, GLUCOSEU, HGBUR, BILIRUBINUR, KETONESUR, PROTEINUR, UROBILINOGEN, NITRITE, LEUKOCYTESUR   STUDIES:  Mammographic studies reviewed with the patient  ELIGIBLE FOR AVAILABLE RESEARCH PROTOCOL: no   ASSESSMENT: 58 y.o. [place], [place] woman status post left breast biopsy 03/20/2018 and 11/20/2018 showing atypical lobular hyperplasia  (1) status post left lumpectomy 01/17/2019 showing lobular carcinoma in situ  (2) breast cancer high risk (30 to 50% lifetime risk)  (a) risk reduction: Tamoxifen started 02/22/2019  (b) intensified screening:   Breast MRI DEC   Breast Mammography JUN   PLAN: Mariah Haley is tolerating the Tamoxifen well.  She is only having mild tolerable hot flashes, and I noted that these will likely improve in the coming months.  She will continue this.    We reviewed her risk of blood clots and how to avoid these.  She notes she is having bunion surgery over the winter.  During this, I recommended she hold the tamoxifen the week prior and week after surgery due to the elevation she  will have to do to her foot, and slight increase of her risk for blood clots.  She understands this.  Mariah Haley and I reviewed the recommendation for her to see gynecology annually.  She knows that she should walk frequently, at least every hour, and do ankle pumps while sitting.  We also reviewed compression stockings that she can get to help with her blood flow in her legs as she has h/o varicose veins.   I recommended healthy diet and exercise in detail to [facility].  I gave her handouts about this.  We will see her back as per below, and my nurse Porsche is looking into when they will schedule her breast MRI and getting it scheduled.  She was recommended to continue with the appropriate pandemic precautions. She knows to call for any  questions that may arise between now and her next appointment.  We are happy to see her sooner if needed.   Dispo:   -Breast MRI in 04/2019  -F/u with PCP in 05/2019  -Mammogram in 10/2019  -F/u with Dr. Jana Hakim in 11/2019  A total of (30) minutes of face-to-face time was spent with this patient with greater than 50% of that time in counseling and care-coordination.   Wilber Bihari, NP 03/28/19 10:28 AM Medical Oncology and Hematology [facility] [place], [place] [address] Tel. [phone]    Fax. [phone]

## 2019-04-03 ENCOUNTER — Ambulatory Visit: Payer: BC Managed Care – PPO | Admitting: Adult Health

## 2019-04-05 ENCOUNTER — Other Ambulatory Visit: Payer: Self-pay | Admitting: General Surgery

## 2019-04-05 DIAGNOSIS — N6092 Unspecified benign mammary dysplasia of left breast: Secondary | ICD-10-CM

## 2019-04-17 ENCOUNTER — Other Ambulatory Visit: Payer: Self-pay | Admitting: Adult Health

## 2019-04-17 ENCOUNTER — Ambulatory Visit
Admission: RE | Admit: 2019-04-17 | Discharge: 2019-04-17 | Disposition: A | Discharge status: Home / Self Care | Payer: BC Managed Care – PPO | Source: Ambulatory Visit | Attending: Adult Health | Admitting: Adult Health

## 2019-04-17 ENCOUNTER — Other Ambulatory Visit: Payer: Self-pay

## 2019-04-17 DIAGNOSIS — N6092 Unspecified benign mammary dysplasia of left breast: Secondary | ICD-10-CM

## 2019-04-17 DIAGNOSIS — D0502 Lobular carcinoma in situ of left breast: Secondary | ICD-10-CM

## 2019-04-17 IMAGING — US US BREAST*R* LIMITED INC AXILLA
1 series · 1 of 1 positions shown · non-contrast
Comparison: Previous exam(s).

CLINICAL DATA: Patient was initially evaluated for asymmetry within
the medial left breast on [DATE] and subsequently biopsied on
[DATE]. Pathology from the first biopsy demonstrated atypical
lobular hyperplasia. At this time, the clip was located medial to
the originally evaluated mammographic asymmetry. Follow-up was
performed. At the time of follow-up on [DATE] an additional
biopsy was recommended of the originally evaluated asymmetry within
the left breast. This biopsy of this site was performed on
[DATE], demonstrating atypical lobular hyperplasia. The patient
subsequently underwent excision of the second biopsy site (site of
asymmetry) on [DATE]. Patient presents today for evaluation of
the right and left breasts.

EXAM:
DIGITAL DIAGNOSTIC BILATERAL MAMMOGRAM WITH CAD AND TOMO
ULTRASOUND BILATERAL BREAST

[Series 1: us breast*right* limited inc axilla · 0.08mm/px · 1 of 1 slices shown]
[im 1/1]
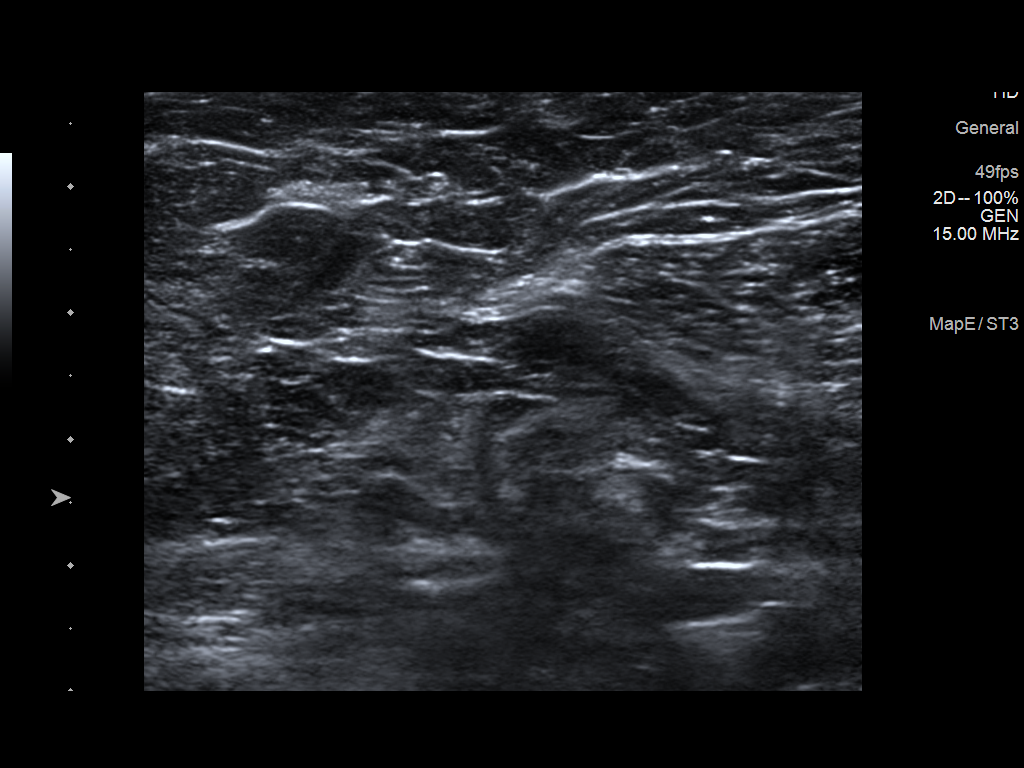

[1 of 1 positions shown; findings below may reference images not displayed]

ACR Breast Density Category c: The breast tissue is heterogeneously
dense, which may obscure small masses.
FINDINGS: Interval postsurgical changes within the medial left breast. Coil
shaped marking clip within the medial left breast persists at the
first site of biopsy. Within the medial left breast there is a new
oval circumscribed mass. Additionally within the posterior right
breast demonstrated best on the CC view there is a new 1.6 cm
irregular mass further evaluated with spot compression CC view. No
additional new masses, calcifications or distortion identified
within either breast.

Mammographic images were processed with CAD.

Targeted ultrasound is performed, showing no definite sonographic
correlate for the irregular mass within the far posterior right
breast demonstrated best on CC view. No right axillary adenopathy.

Within the left breast 10 o'clock position 3 cm from nipple there is
a 1.0 x 1.0 x 0.3 cm cyst corresponding with the new mammographic
mass.
IMPRESSION: 1. Interval development of an irregular mass within the far
posterior right breast demonstrated best on the CC view,
indeterminate.
2. Bilateral fluctuating masses most compatible with cysts.

RECOMMENDATION:
1. Bilateral breast MRI for further evaluation of the breast
parenchyma given history of ALH.
2. Stereotactic guided core needle biopsy of new irregular mass
within the posterior right breast demonstrated best on the CC view.
3. Patient will be scheduled for both the MRI and the stereotactic
guided core needle biopsy.

I have discussed the findings and recommendations with the patient.
If applicable, a reminder letter will be sent to the patient
regarding the next appointment.

BI-RADS CATEGORY  4: Suspicious.

## 2019-04-17 IMAGING — US US BREAST*L* LIMITED INC AXILLA
1 series · 4 of 4 positions shown · non-contrast
Comparison: Previous exam(s).

CLINICAL DATA: Patient was initially evaluated for asymmetry within
the medial left breast on [DATE] and subsequently biopsied on
[DATE]. Pathology from the first biopsy demonstrated atypical
lobular hyperplasia. At this time, the clip was located medial to
the originally evaluated mammographic asymmetry. Follow-up was
performed. At the time of follow-up on [DATE] an additional
biopsy was recommended of the originally evaluated asymmetry within
the left breast. This biopsy of this site was performed on
[DATE], demonstrating atypical lobular hyperplasia. The patient
subsequently underwent excision of the second biopsy site (site of
asymmetry) on [DATE]. Patient presents today for evaluation of
the right and left breasts.

EXAM:
DIGITAL DIAGNOSTIC BILATERAL MAMMOGRAM WITH CAD AND TOMO
ULTRASOUND BILATERAL BREAST

[Series 1: us breast*left* limited inc axilla · 0.06mm/px · 4 of 4 slices shown]
[im 1/4]
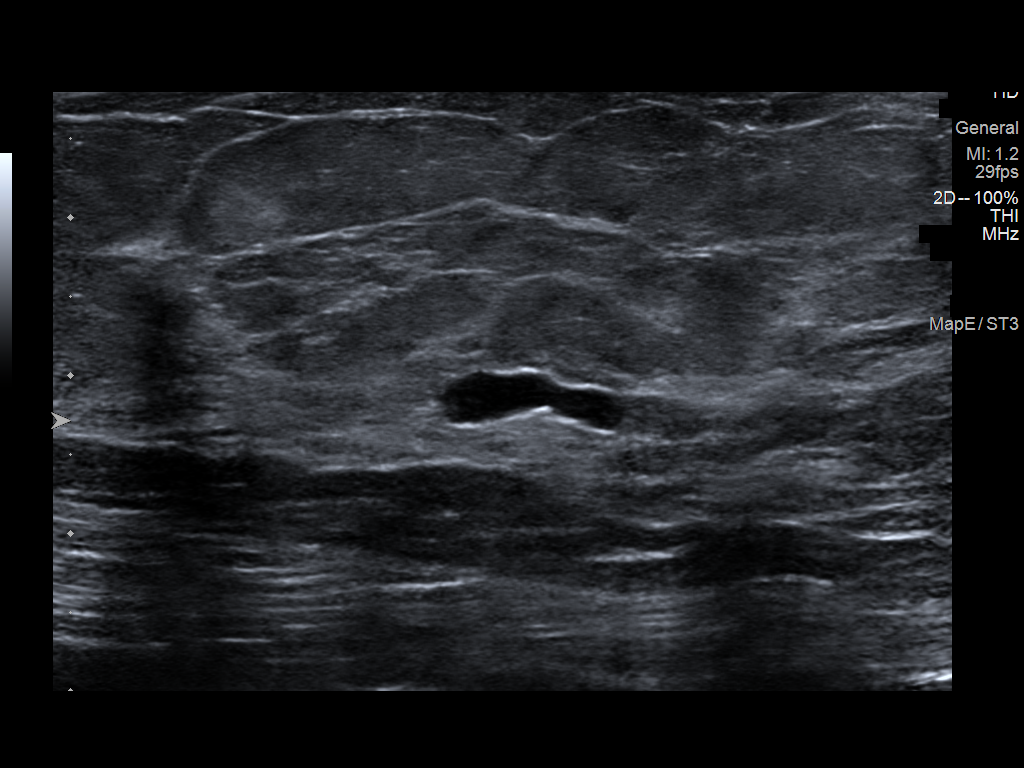
[im 2/4]
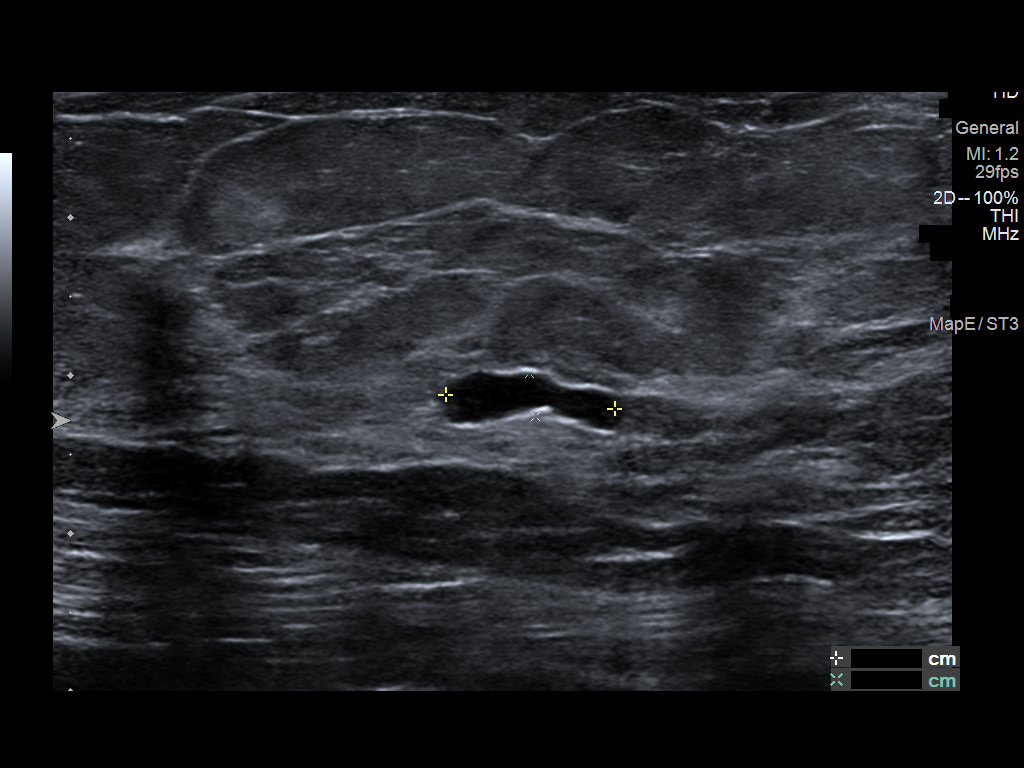
[im 3/4]
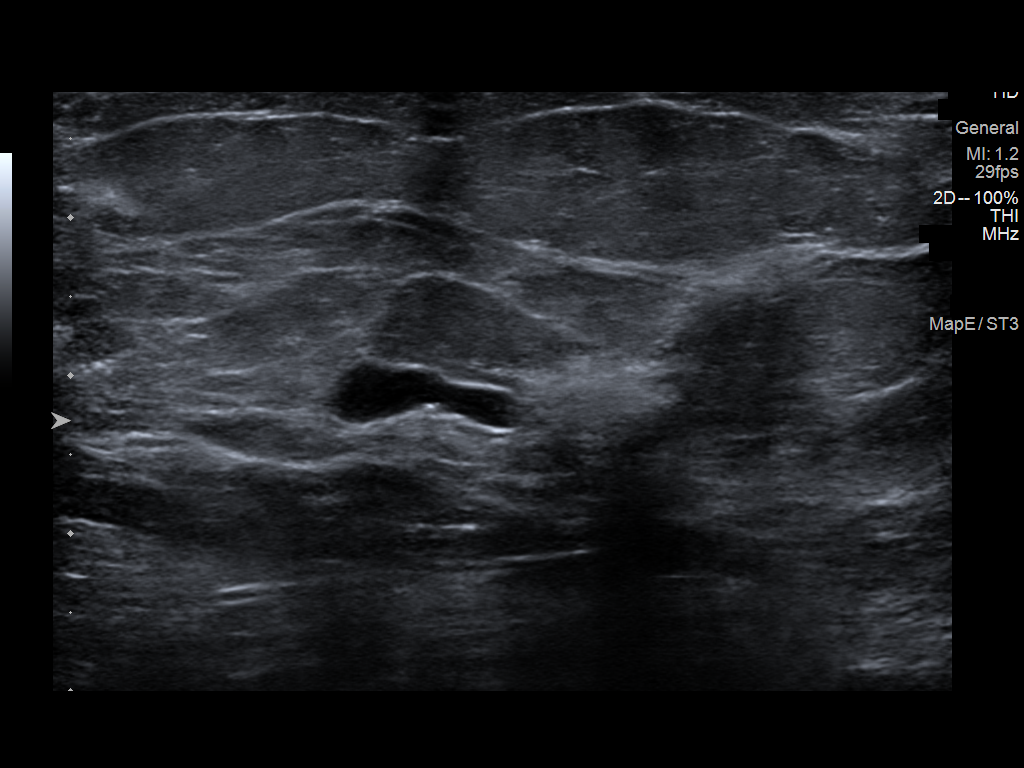
[im 4/4]
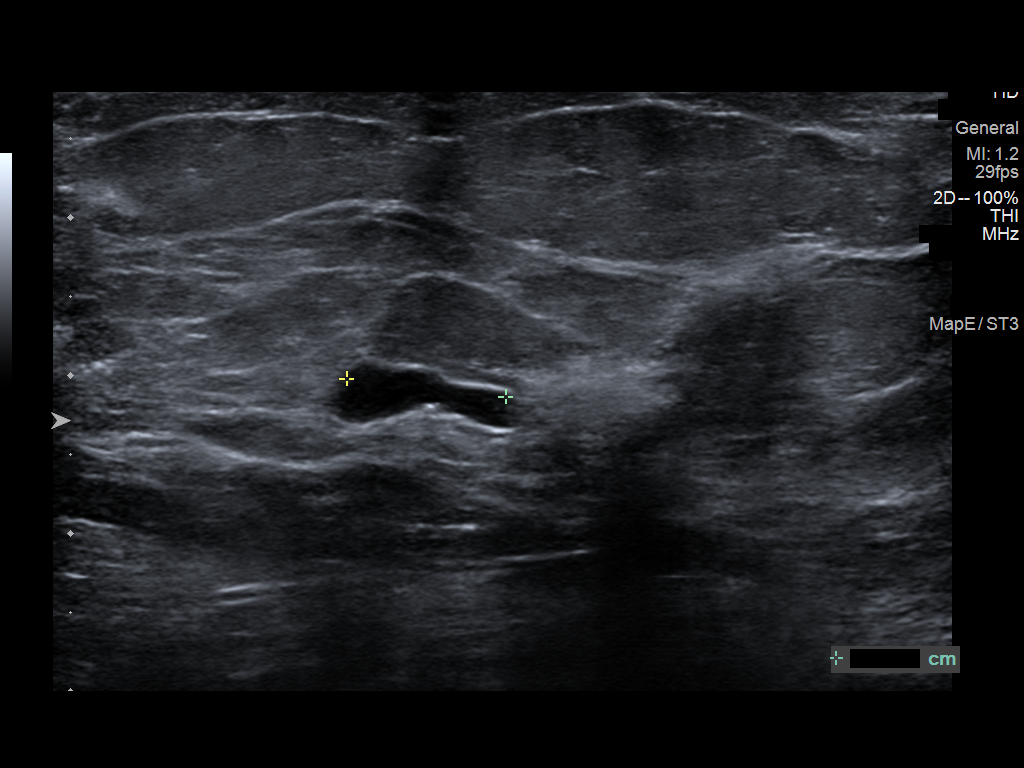

[4 of 4 positions shown; findings below may reference images not displayed]

ACR Breast Density Category c: The breast tissue is heterogeneously
dense, which may obscure small masses.
FINDINGS: Interval postsurgical changes within the medial left breast. Coil
shaped marking clip within the medial left breast persists at the
first site of biopsy. Within the medial left breast there is a new
oval circumscribed mass. Additionally within the posterior right
breast demonstrated best on the CC view there is a new 1.6 cm
irregular mass further evaluated with spot compression CC view. No
additional new masses, calcifications or distortion identified
within either breast.

Mammographic images were processed with CAD.

Targeted ultrasound is performed, showing no definite sonographic
correlate for the irregular mass within the far posterior right
breast demonstrated best on CC view. No right axillary adenopathy.

Within the left breast 10 o'clock position 3 cm from nipple there is
a 1.0 x 1.0 x 0.3 cm cyst corresponding with the new mammographic
mass.
IMPRESSION: 1. Interval development of an irregular mass within the far
posterior right breast demonstrated best on the CC view,
indeterminate.
2. Bilateral fluctuating masses most compatible with cysts.

RECOMMENDATION:
1. Bilateral breast MRI for further evaluation of the breast
parenchyma given history of ALH.
2. Stereotactic guided core needle biopsy of new irregular mass
within the posterior right breast demonstrated best on the CC view.
3. Patient will be scheduled for both the MRI and the stereotactic
guided core needle biopsy.

I have discussed the findings and recommendations with the patient.
If applicable, a reminder letter will be sent to the patient
regarding the next appointment.

BI-RADS CATEGORY  4: Suspicious.

## 2019-04-17 IMAGING — MG DIGITAL DIAGNOSTIC BILAT W/ TOMO W/ CAD
8 of 13 series · 8 of 37 positions shown · non-contrast
Comparison: Previous exam(s).

CLINICAL DATA: Patient was initially evaluated for asymmetry within
the medial left breast on [DATE] and subsequently biopsied on
[DATE]. Pathology from the first biopsy demonstrated atypical
lobular hyperplasia. At this time, the clip was located medial to
the originally evaluated mammographic asymmetry. Follow-up was
performed. At the time of follow-up on [DATE] an additional
biopsy was recommended of the originally evaluated asymmetry within
the left breast. This biopsy of this site was performed on
[DATE], demonstrating atypical lobular hyperplasia. The patient
subsequently underwent excision of the second biopsy site (site of
asymmetry) on [DATE]. Patient presents today for evaluation of
the right and left breasts.

EXAM:
DIGITAL DIAGNOSTIC BILATERAL MAMMOGRAM WITH CAD AND TOMO
ULTRASOUND BILATERAL BREAST

[L CC]
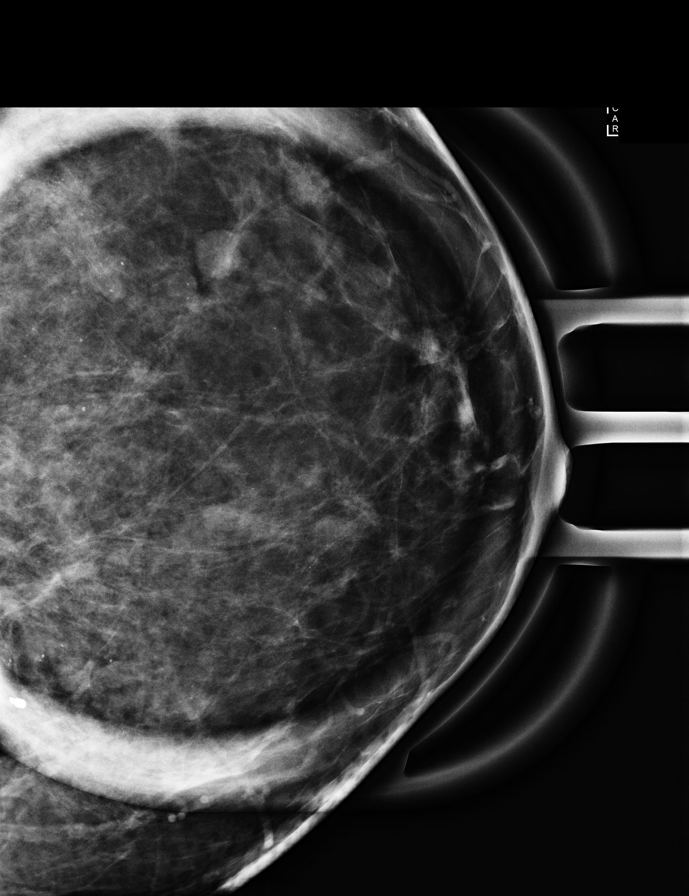

[L CC synth-2D]
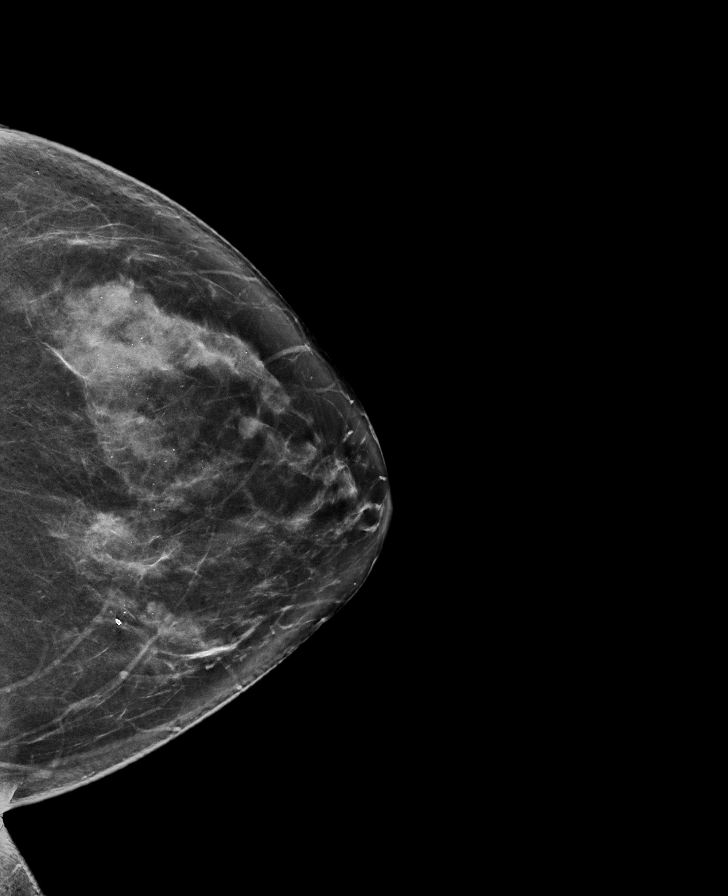

[R LM synth-2D]
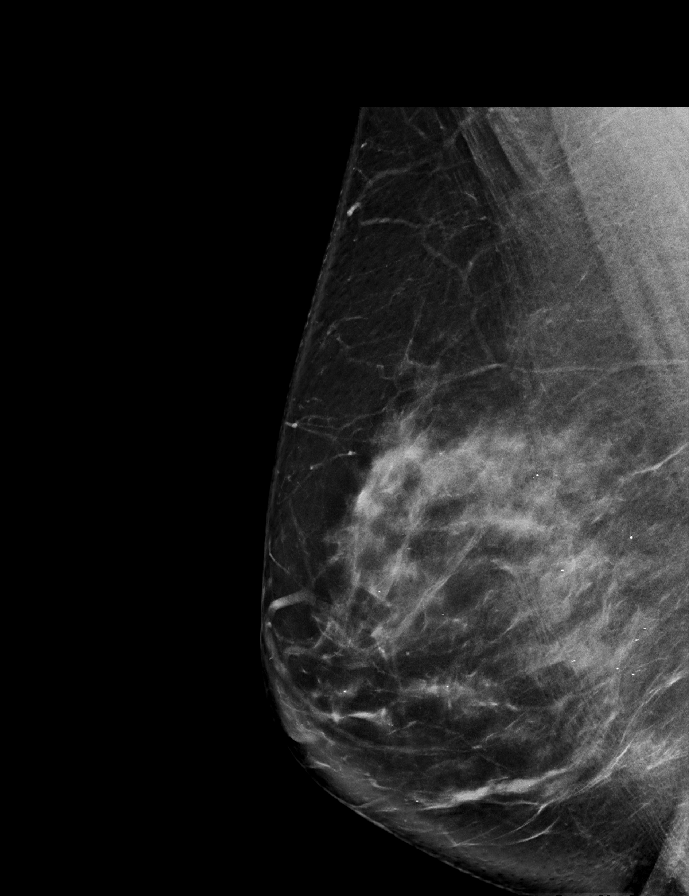

[R CC synth-2D (1 of 2)]
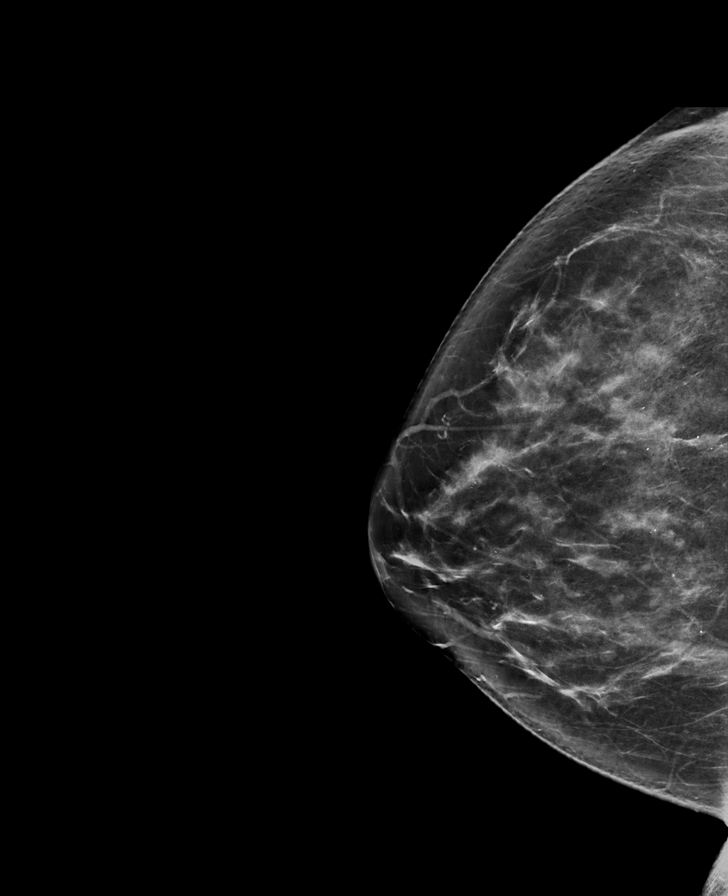

[R MLO synth-2D]
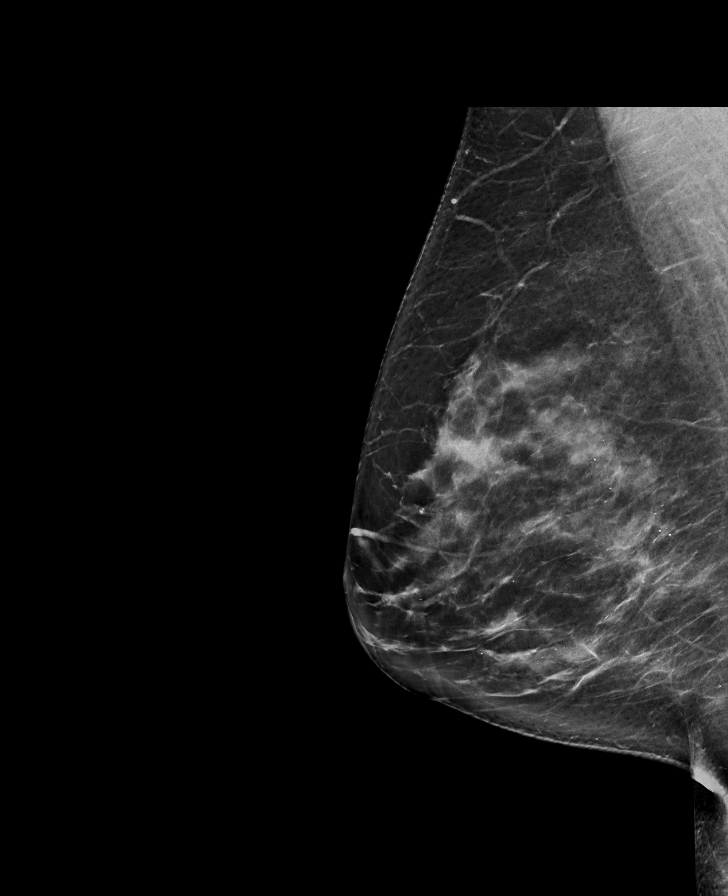

[R CC synth-2D (2 of 2)]
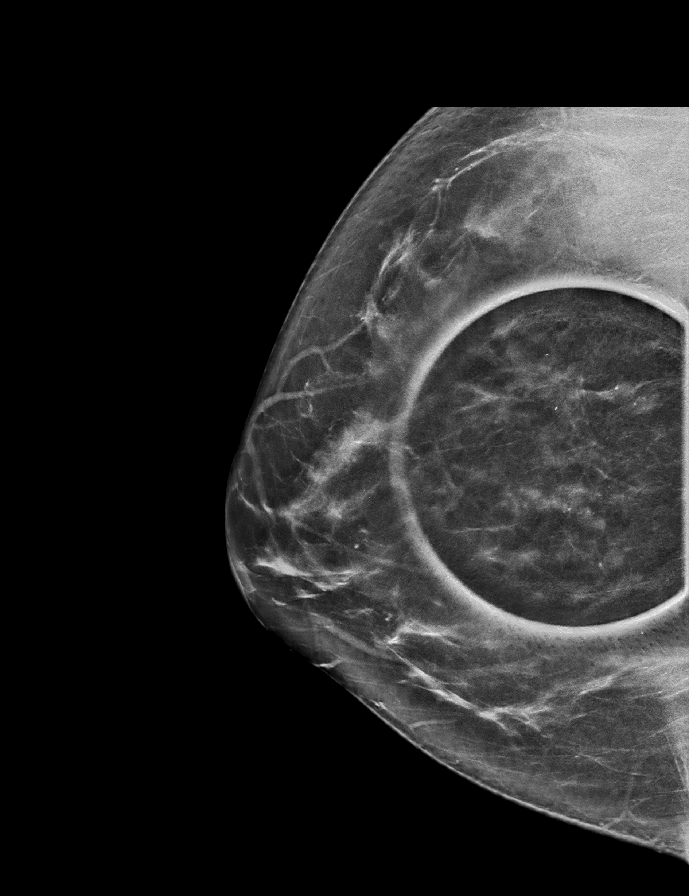

[L MLO synth-2D]
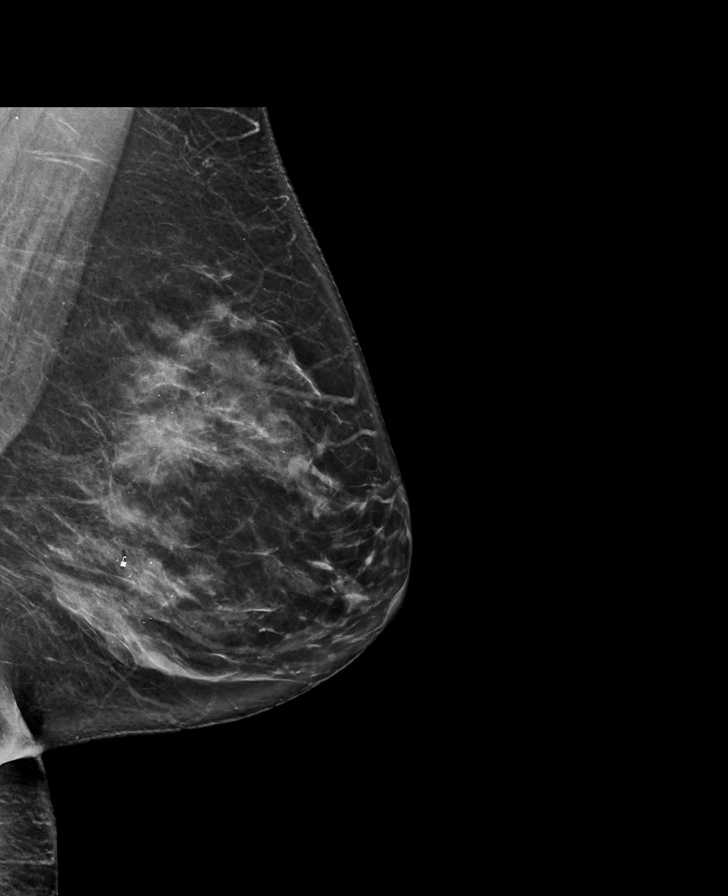

[R CC tomo · tomo slice 39/78.0]
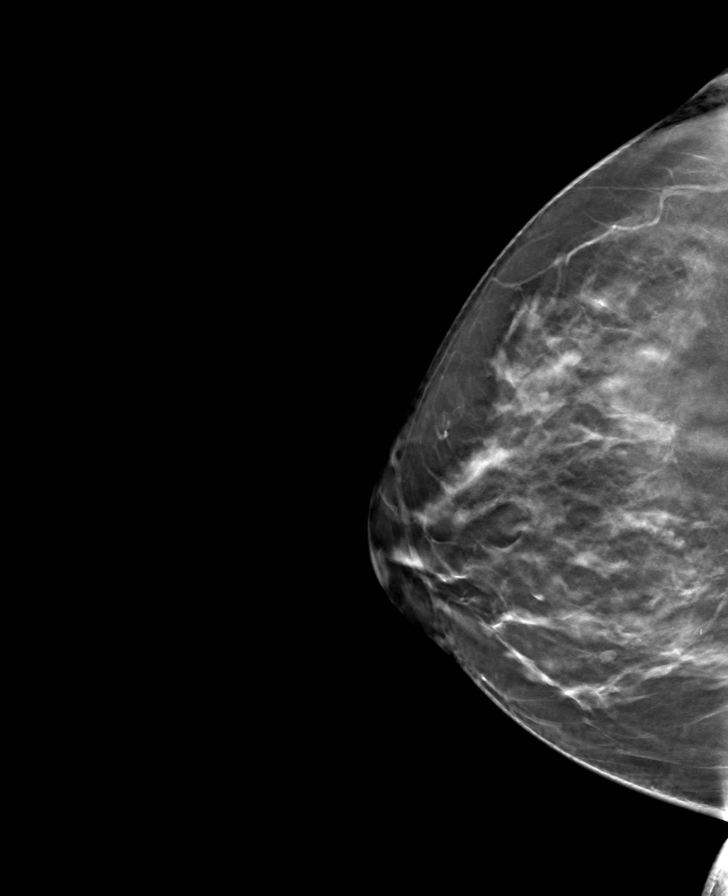

[8 of 37 positions shown; findings below may reference images not displayed]

ACR Breast Density Category c: The breast tissue is heterogeneously
dense, which may obscure small masses.
FINDINGS: Interval postsurgical changes within the medial left breast. Coil
shaped marking clip within the medial left breast persists at the
first site of biopsy. Within the medial left breast there is a new
oval circumscribed mass. Additionally within the posterior right
breast demonstrated best on the CC view there is a new 1.6 cm
irregular mass further evaluated with spot compression CC view. No
additional new masses, calcifications or distortion identified
within either breast.

Mammographic images were processed with CAD.

Targeted ultrasound is performed, showing no definite sonographic
correlate for the irregular mass within the far posterior right
breast demonstrated best on CC view. No right axillary adenopathy.

Within the left breast 10 o'clock position 3 cm from nipple there is
a 1.0 x 1.0 x 0.3 cm cyst corresponding with the new mammographic
mass.
IMPRESSION: 1. Interval development of an irregular mass within the far
posterior right breast demonstrated best on the CC view,
indeterminate.
2. Bilateral fluctuating masses most compatible with cysts.

RECOMMENDATION:
1. Bilateral breast MRI for further evaluation of the breast
parenchyma given history of ALH.
2. Stereotactic guided core needle biopsy of new irregular mass
within the posterior right breast demonstrated best on the CC view.
3. Patient will be scheduled for both the MRI and the stereotactic
guided core needle biopsy.

I have discussed the findings and recommendations with the patient.
If applicable, a reminder letter will be sent to the patient
regarding the next appointment.

BI-RADS CATEGORY  4: Suspicious.

## 2019-04-18 ENCOUNTER — Other Ambulatory Visit: Payer: Self-pay | Admitting: Adult Health

## 2019-04-18 ENCOUNTER — Ambulatory Visit
Admission: RE | Admit: 2019-04-18 | Discharge: 2019-04-18 | Disposition: A | Discharge status: Home / Self Care | Payer: BC Managed Care – PPO | Source: Ambulatory Visit | Attending: General Surgery | Admitting: General Surgery

## 2019-04-18 DIAGNOSIS — N6092 Unspecified benign mammary dysplasia of left breast: Secondary | ICD-10-CM

## 2019-04-18 DIAGNOSIS — Z1231 Encounter for screening mammogram for malignant neoplasm of breast: Secondary | ICD-10-CM

## 2019-04-18 IMAGING — MR MR BREAST BILAT WO/W CM
8 of 13 series · 28 of 48 positions shown · IV contrast (gadavist)
Comparison: Previous exam(s).
COMPARISON: Previous exam(s).

Addendum:
CLINICAL DATA: 58-year-old female presenting for follow-up after
biopsy results demonstrated ALH within the left breast. Patient had
surgical excision in [DATE]. Recent diagnostic evaluation in
[DATE] demonstrated interval development of a new mass in the
posterior right breast for which stereotactic biopsy was recommended
but has not yet been performed.

LABS:  None performed today on site.
EXAM:
BILATERAL BREAST MRI WITH AND WITHOUT CONTRAST
TECHNIQUE: Multiplanar, multisequence MR images of both breasts were obtained
prior to and following the intravenous administration of 9 ml of
Gadavist.

[Series 2: t2_tirm_tra ipat (a-p) · axial · 3.0mm · 0.70mm/px · 1 of 64 slices shown]
[im 1/64]
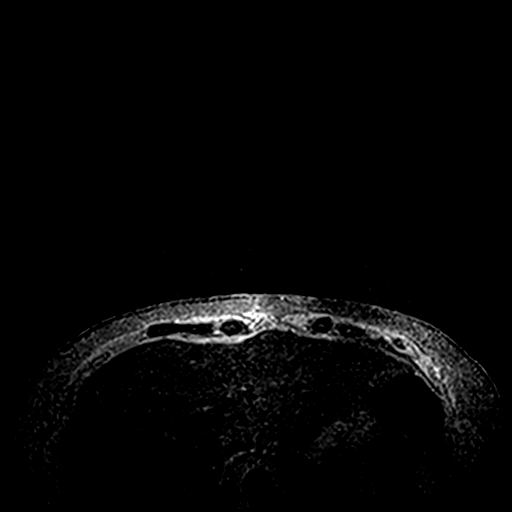

[Series 3: fl3d pre-cm no · axial · non-contrast · 1.2mm · 0.94mm/px · z∈[-82,+109]mm · 4 of 160 slices shown]
[im 1/160]
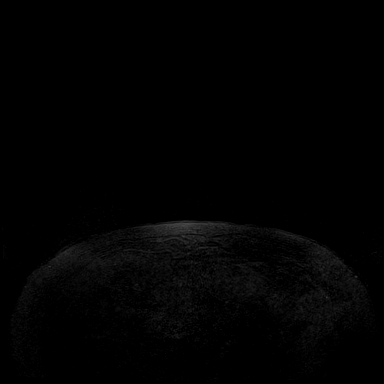
[im 54/160]
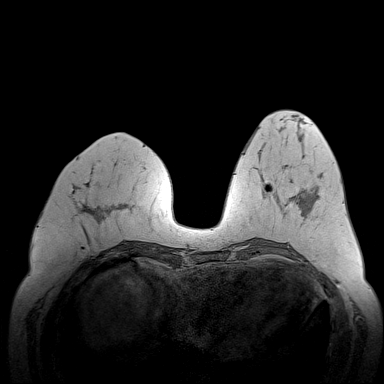
[im 107/160]
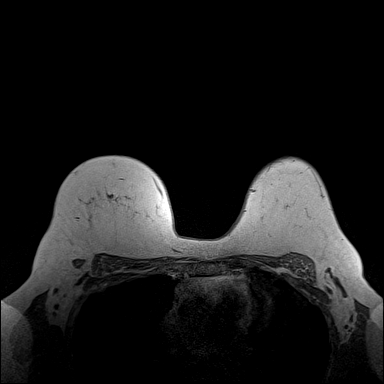
[im 160/160]
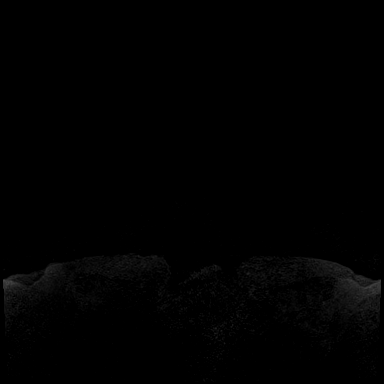

[Series 4: fl3d pre-cm · axial · non-contrast · 1.2mm · 0.94mm/px · z∈[-91,+119]mm · 5 of 176 slices shown]
[im 1/176]
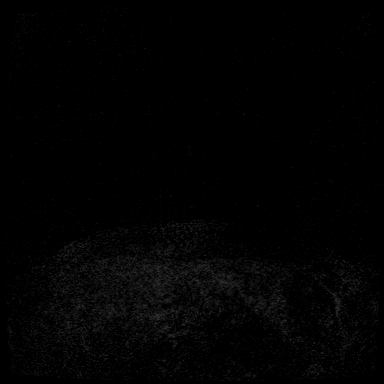
[im 44/176]
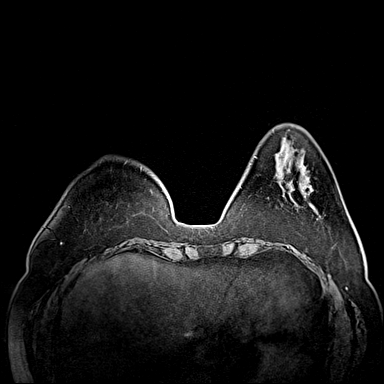
[im 88/176]
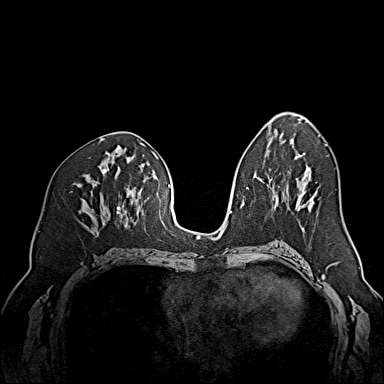
[im 132/176]
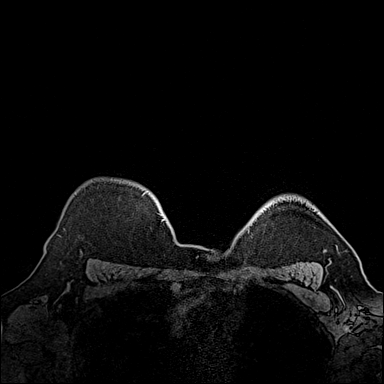
[im 176/176]
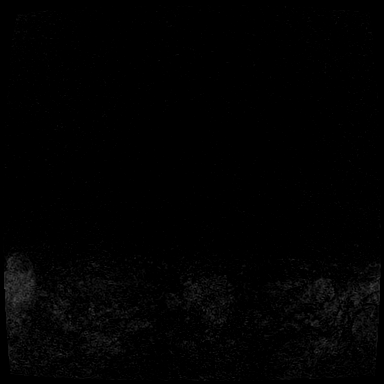

[Series 5: fl3d post immediate · axial · 1.2mm · 0.94mm/px · z∈[-91,+119]mm · 5 of 176 slices shown (1 of 3)]
[im 1/176]
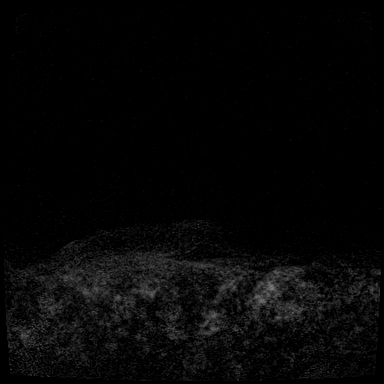
[im 44/176]
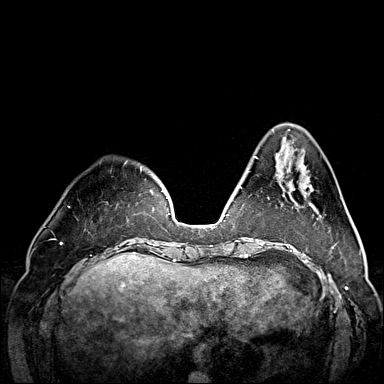
[im 88/176]
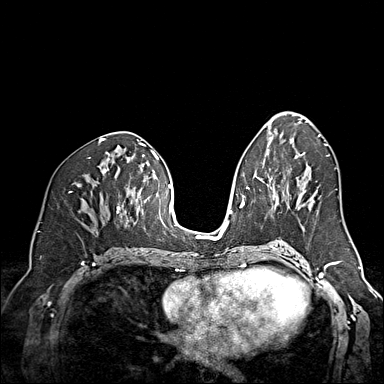
[im 132/176]
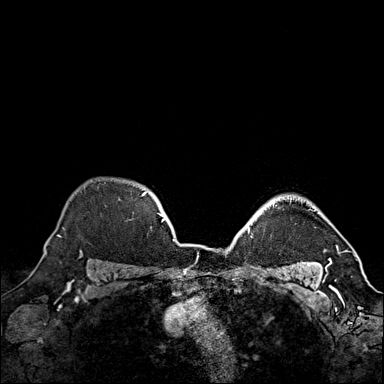
[im 176/176]
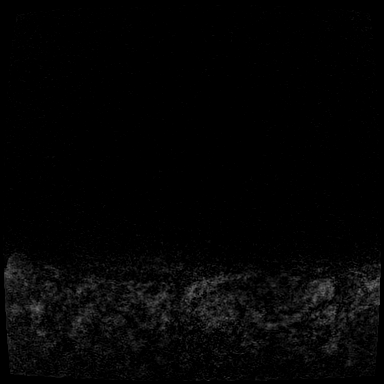

[Series 6: fl3d post immediate · axial · 1.2mm · 0.94mm/px · z∈[-91,+119]mm · 5 of 176 slices shown (2 of 3)]
[im 1/176]
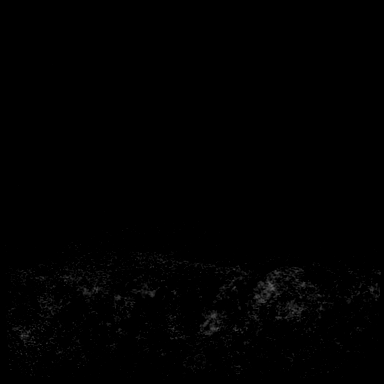
[im 44/176]
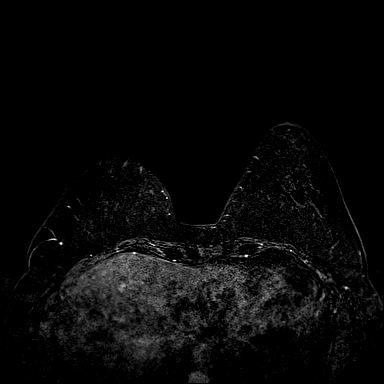
[im 88/176]
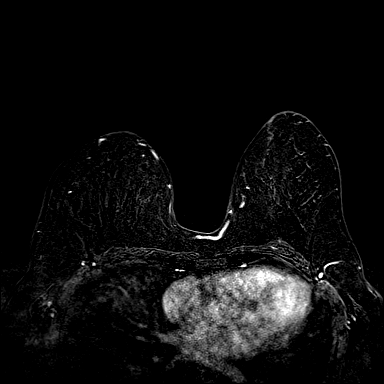
[im 132/176]
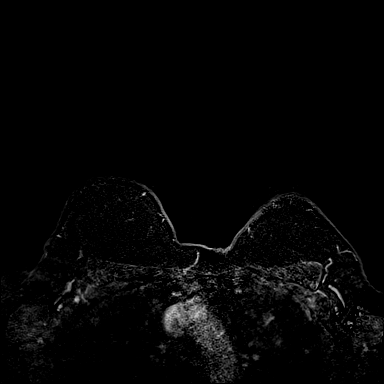
[im 176/176]
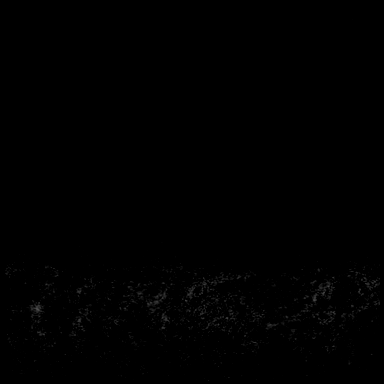

[Series 7: fl3d post immediate · axial · 211.2mm · 0.94mm/px · 1 of 1 slices shown (3 of 3)]
[im 1/1]
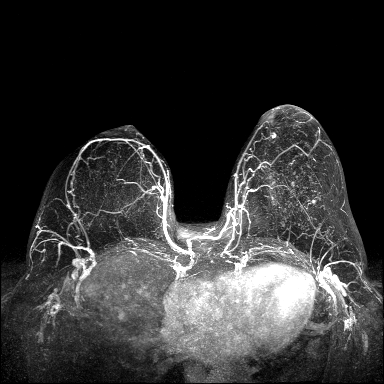

[Series 8: fl3d post 3min · axial · 1.2mm · 0.94mm/px · z∈[-91,+119]mm · 6 of 176 slices shown]
[im 1/176]
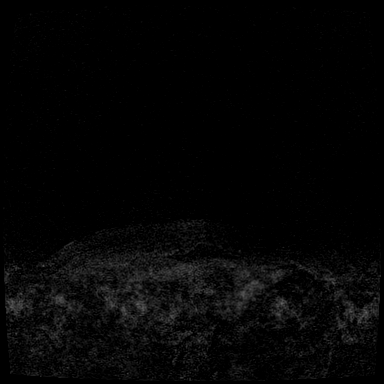
[im 36/176]
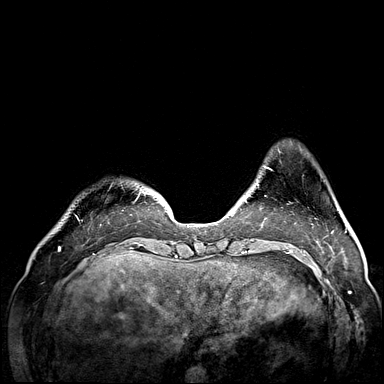
[im 71/176]
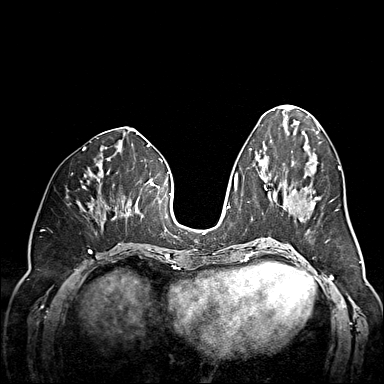
[im 106/176]
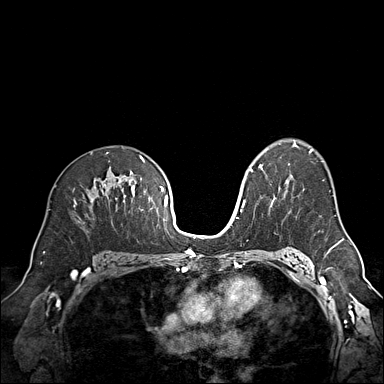
[im 141/176]
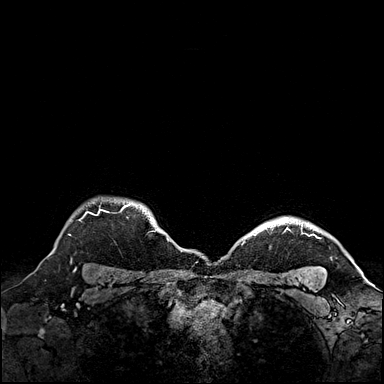
[im 176/176]
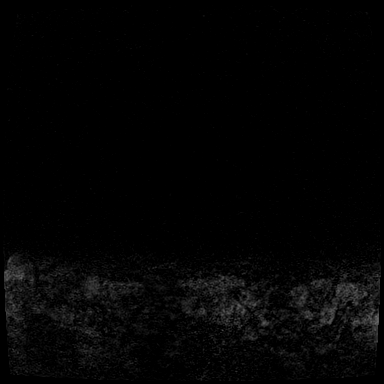

[Series 9: fl3d post 3min_sub · axial · 1.2mm · 0.94mm/px · 1 of 176 slices shown]
[im 1/176]
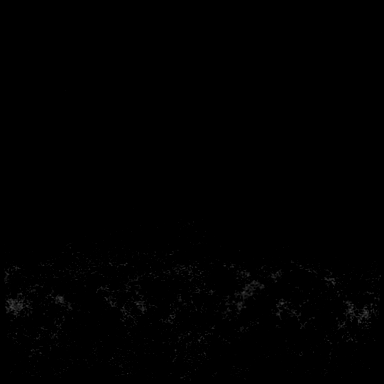

[28 of 48 positions shown; findings below may reference images not displayed]

Three-dimensional MR images were rendered by post-processing of the
original MR data on an independent workstation. The
three-dimensional MR images were interpreted, and findings are
reported in the following complete MRI report for this study. Three
dimensional images were evaluated at the independent [employer]
workstation
FINDINGS: Breast composition: c. Heterogeneous fibroglandular tissue.

Background parenchymal enhancement: Mild. There are T2 hyperintense
nonenhancing cysts scattered throughout the bilateral breasts.

Right breast: No mass or abnormal enhancement.

Left breast: Susceptibility artifact from post biopsy clip is
demonstrated in the lower inner quadrant of the right breast at mid
to posterior depth. Additionally post lumpectomy changes are noted
in the upper inner quadrant at middle depth. These are at the sites
of patient's prior biopsies.

There is a 7 x 7 x 6 mm enhancing slightly irregular mass in the
upper inner quadrant at anterior depth (series 5, image 86/176). It
demonstrates suspicious washout kinetics without a T2 correlate.

An additional irregular, enhancing mass is also noted in the
inferior central aspect at middle depth (series 5, image 129/176).
It measures 7 x 6 x 4 mm with persistent washout kinetics but no T2
correlate.

No additional suspicious masses or non mass enhancement are
identified within the left breast.

Lymph nodes: No abnormal appearing lymph nodes.

Ancillary findings:  None.
IMPRESSION: 1. Two indeterminate 7 mm enhancing masses in the upper inner left
breast at anterior depth and lower central left breast at middle
depth. Recommendation is for MRI guided biopsy of both areas.
2. Postsurgical and post biopsy changes of the left breast without
associated abnormal findings.
3. No MRI evidence of malignancy on the right.
4. No suspicious lymphadenopathy.

RECOMMENDATION:
1. Two area MRI guided biopsy of the LEFT breast.
2. Recommendation stands for stereotactic biopsy of the
mammographically identified RIGHT breast mass as dictated on
diagnostic report dated [DATE].

BI-RADS CATEGORY  4: Suspicious.

ADDENDUM:
Incidental note is made of a LEFT thyroid lesion. A portion of it is
T2 bright and measures 3.9 centimeters. Post gadolinium images
suggest possible enhancement. Lesion measures at least
centimeters. Consider further evaluation with thyroid ultrasound.

*** End of Addendum ***
Three-dimensional MR images were rendered by post-processing of the
original MR data on an independent workstation. The
three-dimensional MR images were interpreted, and findings are
reported in the following complete MRI report for this study. Three
dimensional images were evaluated at the independent [employer]
workstation
FINDINGS: Breast composition: c. Heterogeneous fibroglandular tissue.

Background parenchymal enhancement: Mild. There are T2 hyperintense
nonenhancing cysts scattered throughout the bilateral breasts.

Right breast: No mass or abnormal enhancement.

Left breast: Susceptibility artifact from post biopsy clip is
demonstrated in the lower inner quadrant of the right breast at mid
to posterior depth. Additionally post lumpectomy changes are noted
in the upper inner quadrant at middle depth. These are at the sites
of patient's prior biopsies.

There is a 7 x 7 x 6 mm enhancing slightly irregular mass in the
upper inner quadrant at anterior depth (series 5, image 86/176). It
demonstrates suspicious washout kinetics without a T2 correlate.

An additional irregular, enhancing mass is also noted in the
inferior central aspect at middle depth (series 5, image 129/176).
It measures 7 x 6 x 4 mm with persistent washout kinetics but no T2
correlate.

No additional suspicious masses or non mass enhancement are
identified within the left breast.

Lymph nodes: No abnormal appearing lymph nodes.

Ancillary findings:  None.
IMPRESSION: 1. Two indeterminate 7 mm enhancing masses in the upper inner left
breast at anterior depth and lower central left breast at middle
depth. Recommendation is for MRI guided biopsy of both areas.
2. Postsurgical and post biopsy changes of the left breast without
associated abnormal findings.
3. No MRI evidence of malignancy on the right.
4. No suspicious lymphadenopathy.

RECOMMENDATION:
1. Two area MRI guided biopsy of the LEFT breast.
2. Recommendation stands for stereotactic biopsy of the
mammographically identified RIGHT breast mass as dictated on
diagnostic report dated [DATE].

BI-RADS CATEGORY  4: Suspicious.

## 2019-04-18 MED ORDER — GADOBUTROL 1 MMOL/ML IV SOLN
9.0000 mL | Freq: Once | INTRAVENOUS | Status: AC | PRN
  Administered 2019-04-18: 09:00:00 9 mL via INTRAVENOUS

## 2019-04-24 HISTORY — PX: BREAST BIOPSY: SHX20

## 2019-04-26 ENCOUNTER — Other Ambulatory Visit: Payer: Self-pay | Admitting: Adult Health

## 2019-04-26 ENCOUNTER — Other Ambulatory Visit: Payer: Self-pay

## 2019-04-26 ENCOUNTER — Ambulatory Visit
Admission: RE | Admit: 2019-04-26 | Discharge: 2019-04-26 | Disposition: A | Discharge status: Home / Self Care | Payer: BC Managed Care – PPO | Source: Ambulatory Visit | Attending: Adult Health | Admitting: Adult Health

## 2019-04-26 DIAGNOSIS — N6092 Unspecified benign mammary dysplasia of left breast: Secondary | ICD-10-CM

## 2019-04-26 DIAGNOSIS — D0502 Lobular carcinoma in situ of left breast: Secondary | ICD-10-CM

## 2019-04-26 HISTORY — PX: BREAST BIOPSY: SHX20

## 2019-04-26 IMAGING — MG MM BREAST LOCALIZATION CLIP
4 series · 4 of 12 positions shown · non-contrast
Comparison: Previous exam(s).

CLINICAL DATA: Status post stereotactic guided core biopsy of mass
in the RIGHT breast.

EXAM:
DIAGNOSTIC RIGHT MAMMOGRAM POST STEREOTACTIC BIOPSY

[R CC synth-2D]
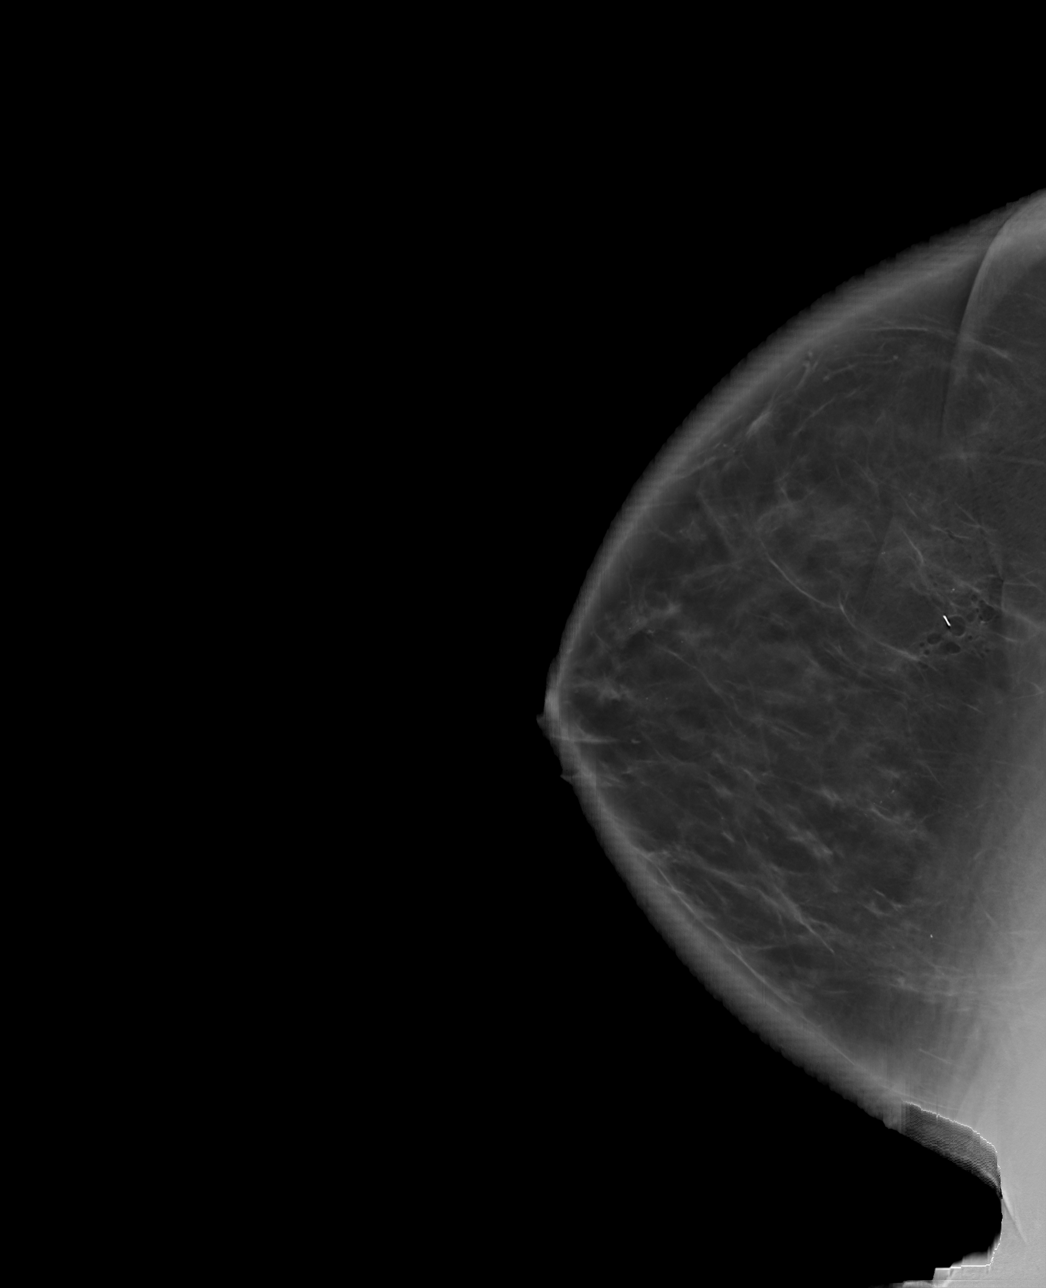

[R ML synth-2D]
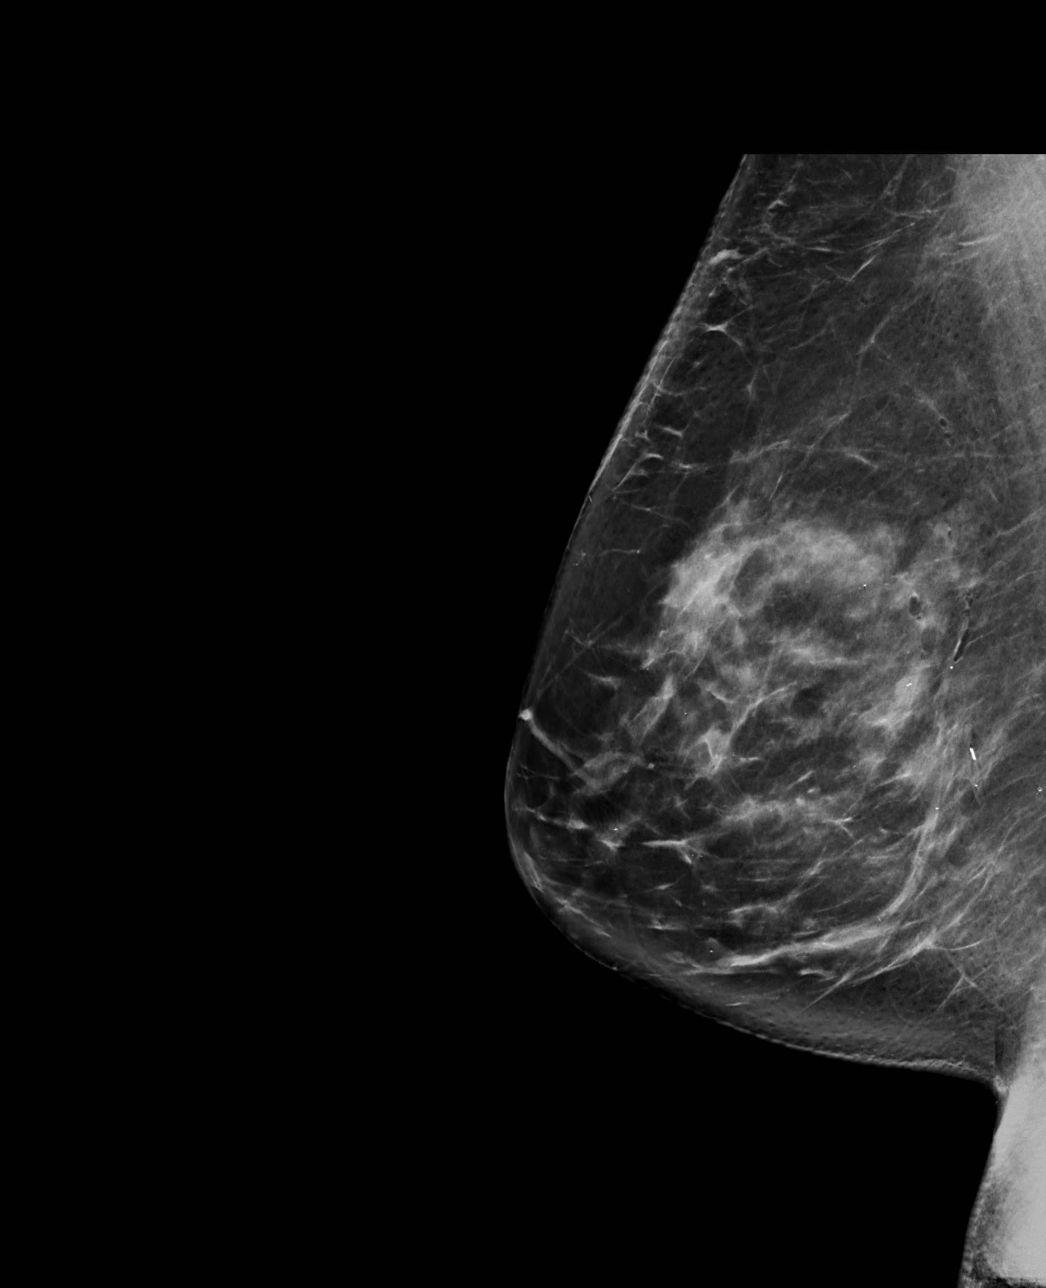

[R ML tomo · tomo slice 47/92.0]
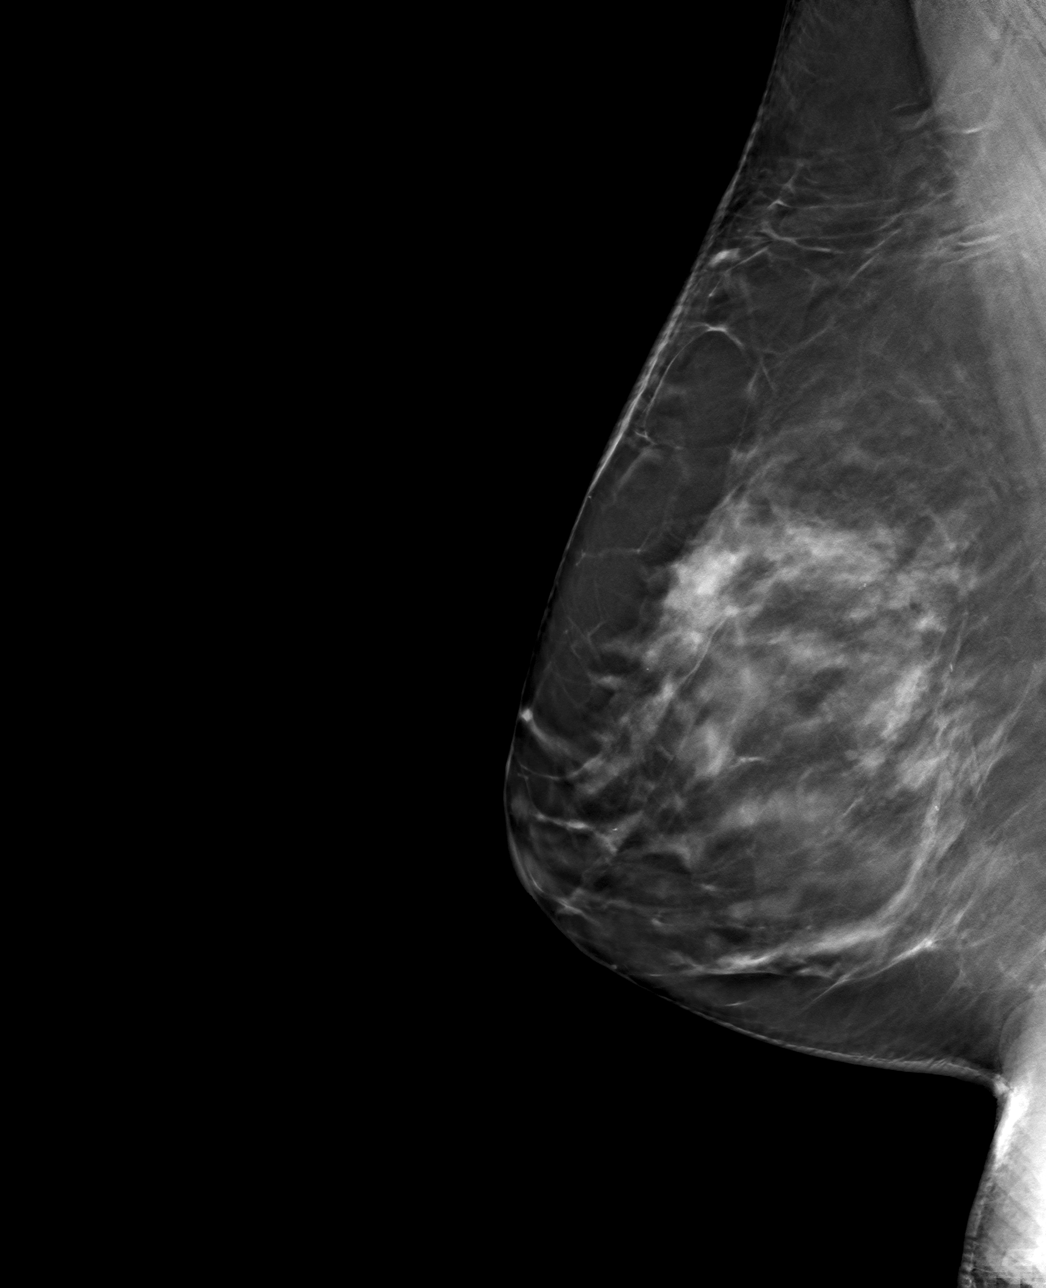

[R CC tomo · tomo slice 50/99.0]
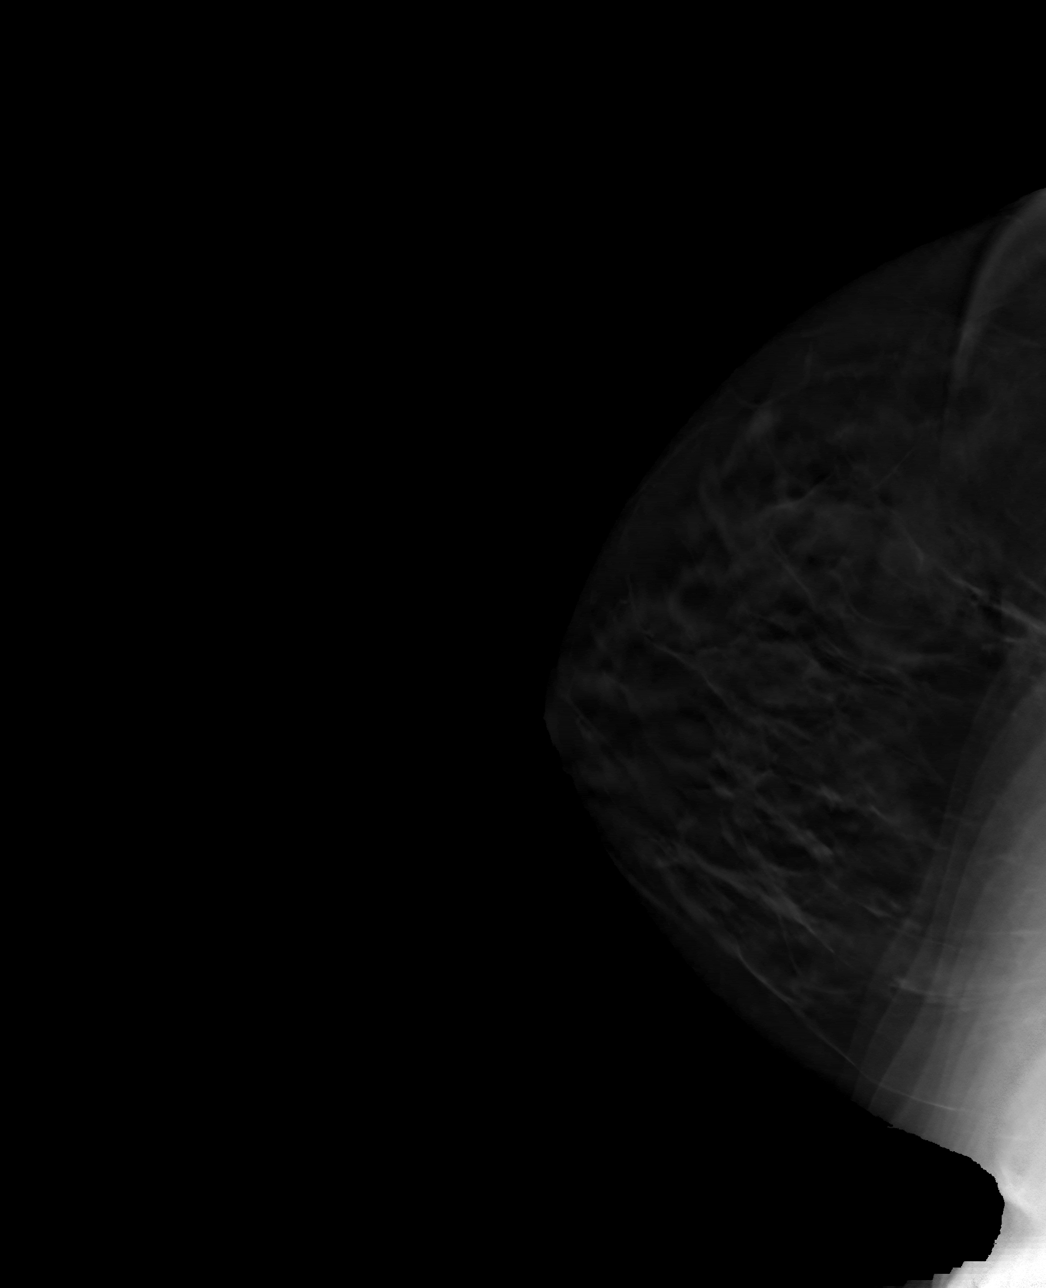

[4 of 12 positions shown; findings below may reference images not displayed]

FINDINGS: Mammographic images were obtained following stereotactic guided
biopsy of mass and associated calcifications in the posterior
central portion of the RIGHT breast and placement of an X shaped
clip. The biopsy marking clip is in expected position at the site of
biopsy.
IMPRESSION: Appropriate positioning of the X shaped biopsy marking clip at the
site of biopsy in the posterior central RIGHT breast.

Final Assessment: Post Procedure Mammograms for Marker Placement

## 2019-04-26 IMAGING — MG MM BREAST BX W/ LOC DEV 1ST LESION IMAGE BX SPEC STEREO GUIDE*R*
7 series · 7 of 27 positions shown · non-contrast
Comparison: Previous exams.
COMPARISON: Previous exams.
COMPARISON: Previous exams.

Addendum:
CLINICAL DATA: Patient presents for stereotactic guided core biopsy
of RIGHT mass.

EXAM:
RIGHT BREAST STEREOTACTIC CORE NEEDLE BIOPSY

[R (1 of 2)]
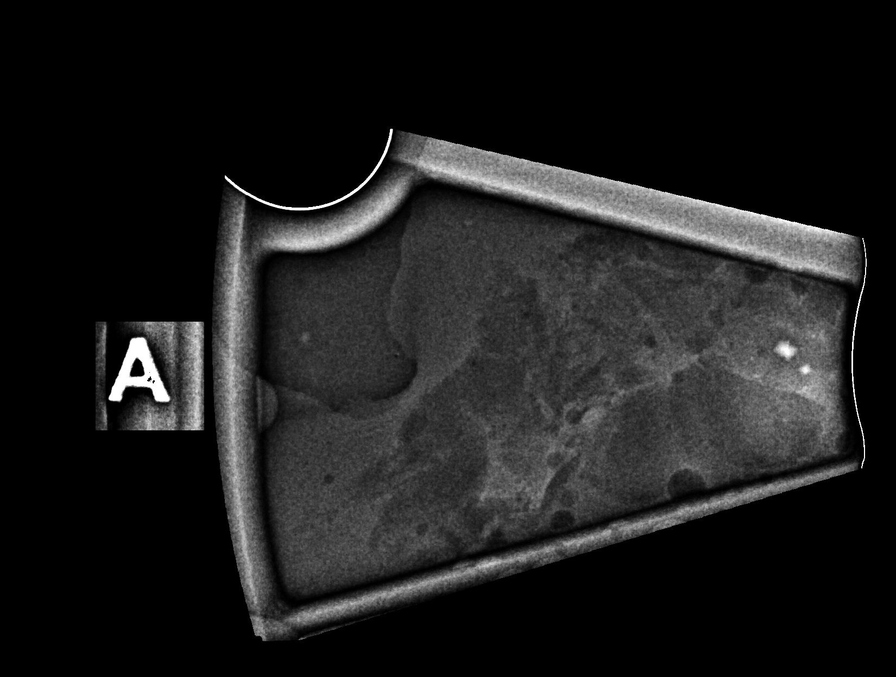

[R (2 of 2)]
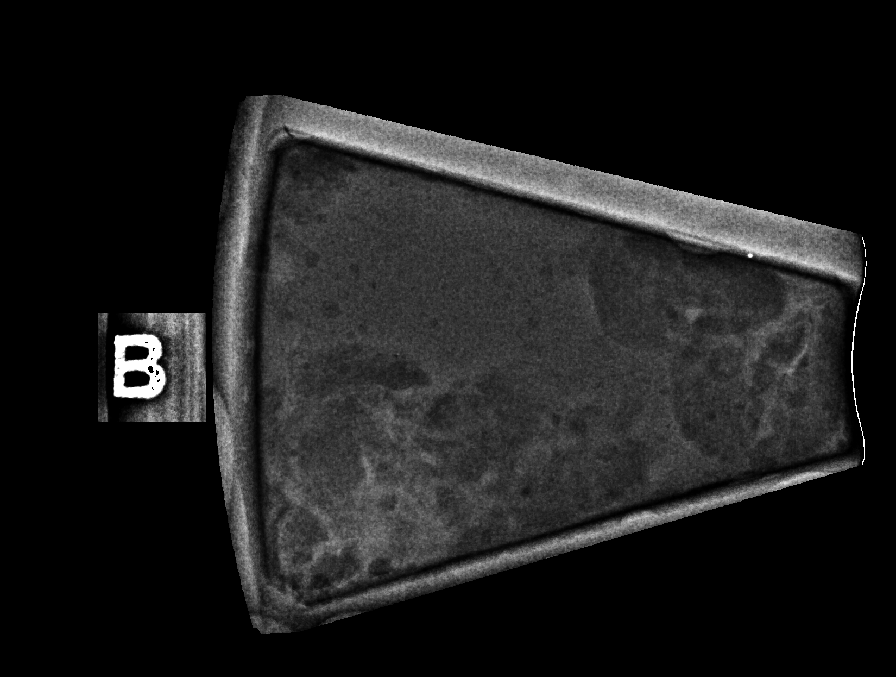

[R CC tomo (1 of 5) · tomo slice 40/79.0]
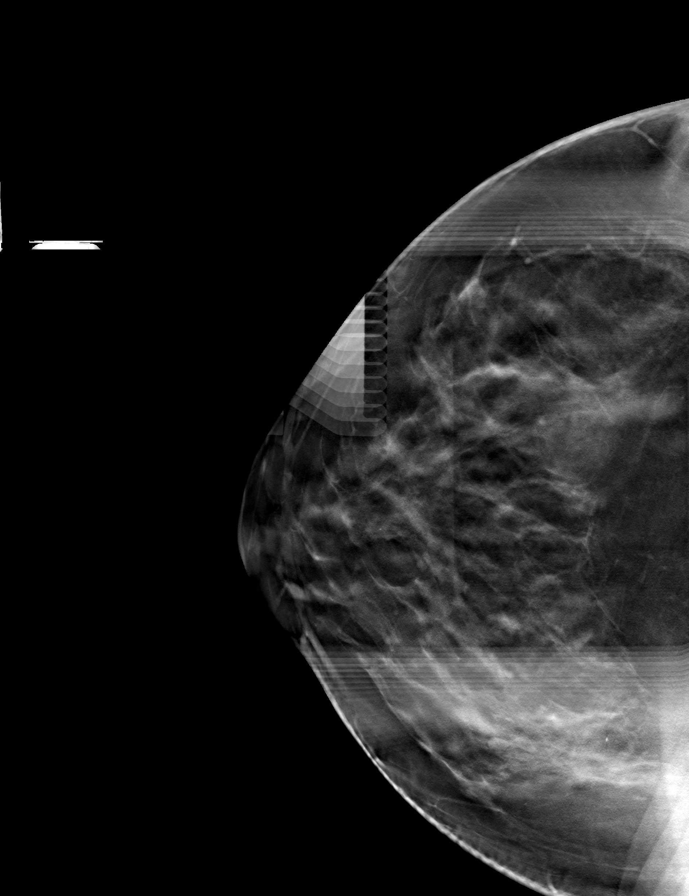

[R CC tomo (2 of 5) · tomo slice 39/78.0]
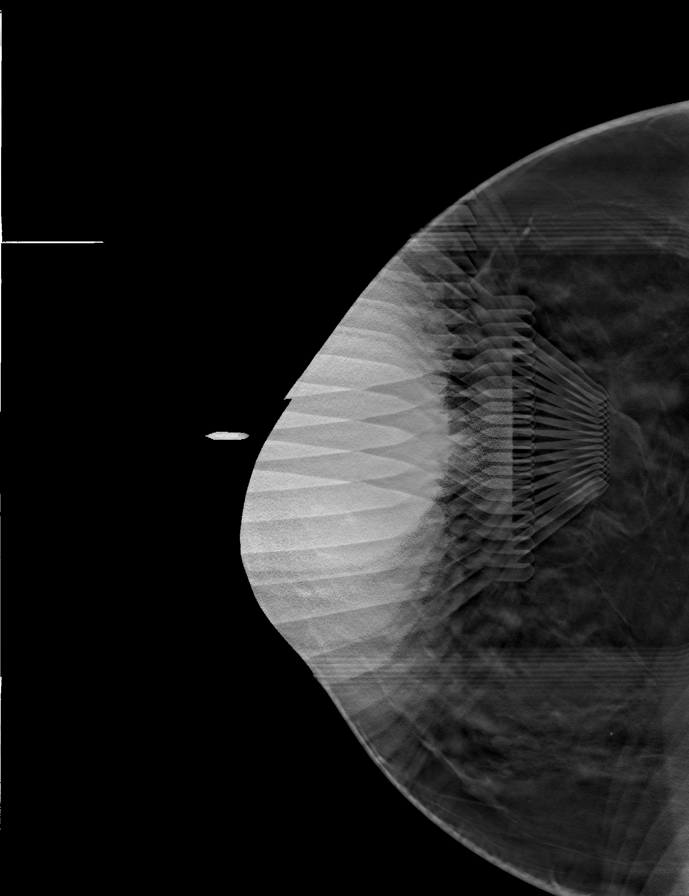

[R CC tomo (3 of 5) · tomo slice 35/69.0]
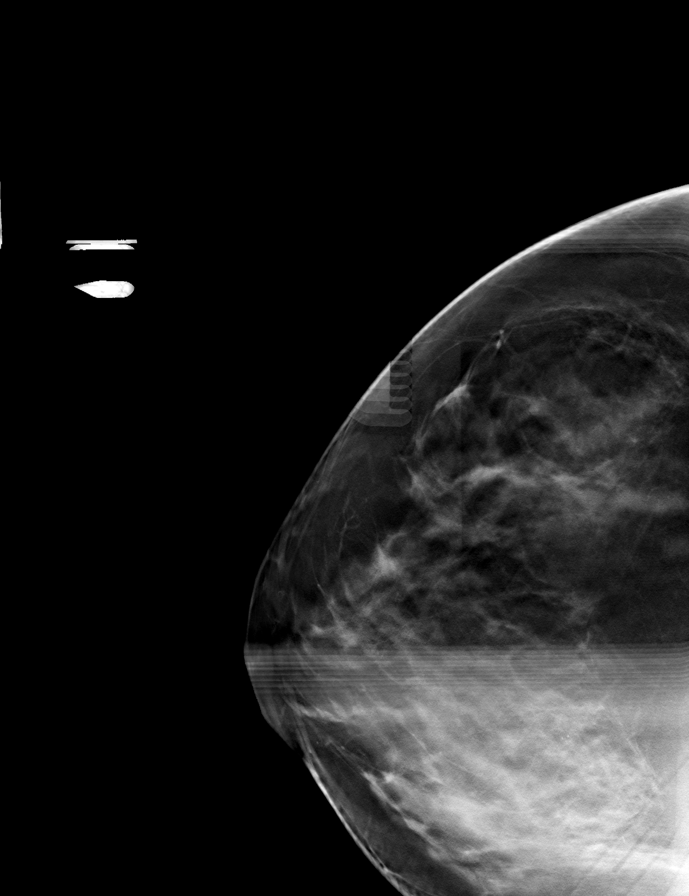

[R CC tomo (4 of 5) · tomo slice 39/78.0]
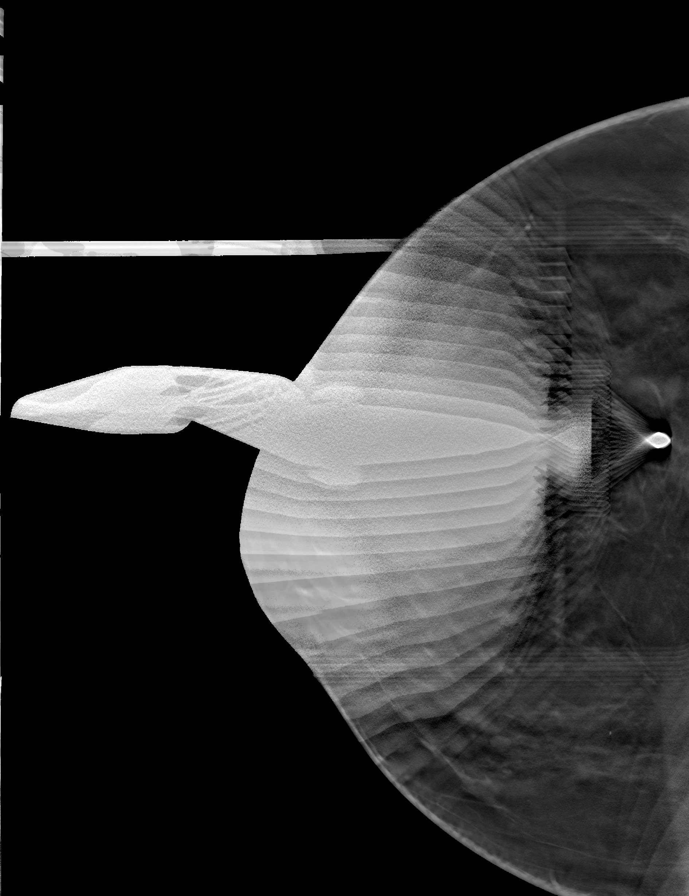

[R CC tomo (5 of 5) · tomo slice 39/78.0]
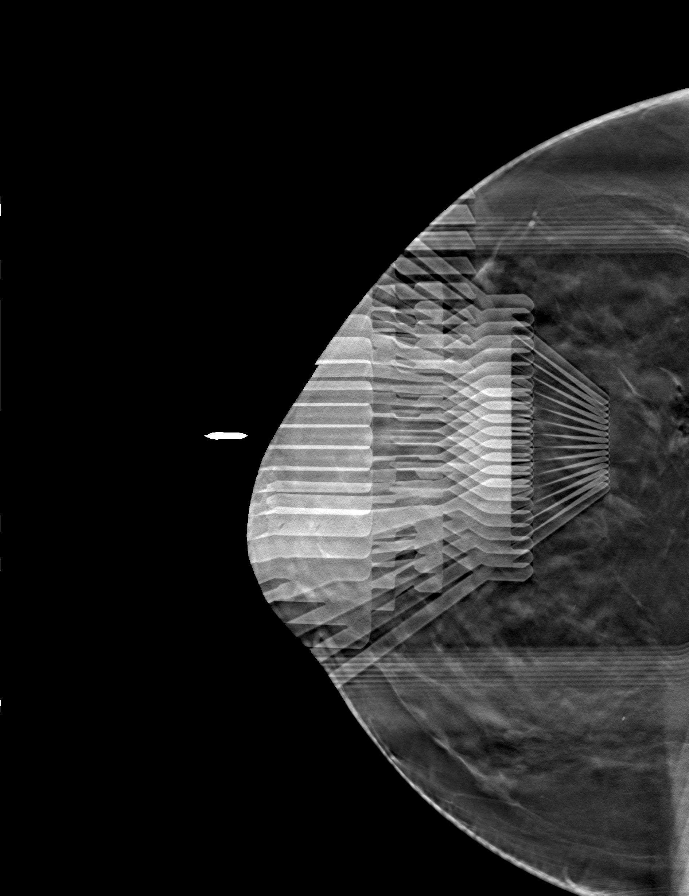

[7 of 27 positions shown; findings below may reference images not displayed]



Using sterile technique and 1% Lidocaine as local anesthetic, under
stereotactic guidance, a 9 gauge vacuum assisted device was used to
perform core needle biopsy of mass in the posterior central portion
of the RIGHT breast, associated with calcifications using a
craniocaudal approach. Specimen radiograph was performed showing
calcifications in specimens obtained. Specimens with calcifications
are identified for pathology.

Lesion quadrant: Posterior central RIGHT

At the conclusion of the procedure, X shaped tissue marker clip was
deployed into the biopsy cavity. Follow-up 2-view mammogram was
performed and dictated separately.
IMPRESSION: Stereotactic-guided biopsy of RIGHT breast mass. No apparent
complications.

ADDENDUM:
Following the biopsy and during compression, the patient complained
of feeling hot. Subsequently, she vomited and felt immediately
better. Patient recovered quickly and was discharged from the [REDACTED] in good condition.

ADDENDUM:
Pathology revealed FIBROCYSTIC CHANGE WITH CALCIFICATIONS of the
RIGHT breast, posterior central, X clip. This was found to be
concordant by Dr. EFEREBO.

Pathology results were discussed with the patient by telephone. The
patient reported doing well after the biopsy with tenderness at the
site. Post biopsy instructions and care were reviewed and questions
were answered. The patient was encouraged to call The [REDACTED]

Patient is scheduled for 2 area LEFT breast MRI guided biopsies on
[DATE].

Pathology results reported by EFEREBO, RN on [DATE].

*** End of Addendum ***
Addendum:
FINDINGS: The patient and I discussed the procedure of stereotactic-guided
biopsy including benefits and alternatives. We discussed the high
likelihood of a successful procedure. We discussed the risks of the
procedure including infection, bleeding, tissue injury, clip
migration, and inadequate sampling. Informed written consent was
given. The usual time out protocol was performed immediately prior
to the procedure.

Using sterile technique and 1% Lidocaine as local anesthetic, under
stereotactic guidance, a 9 gauge vacuum assisted device was used to
perform core needle biopsy of mass in the posterior central portion
of the RIGHT breast, associated with calcifications using a
craniocaudal approach. Specimen radiograph was performed showing
calcifications in specimens obtained. Specimens with calcifications
are identified for pathology.

Lesion quadrant: Posterior central RIGHT

At the conclusion of the procedure, X shaped tissue marker clip was
deployed into the biopsy cavity. Follow-up 2-view mammogram was
performed and dictated separately.
IMPRESSION: Stereotactic-guided biopsy of RIGHT breast mass. No apparent
complications.

ADDENDUM:
Following the biopsy and during compression, the patient complained
of feeling hot. Subsequently, she vomited and felt immediately
better. Patient recovered quickly and was discharged from the [REDACTED] in good condition.



Using sterile technique and 1% Lidocaine as local anesthetic, under
stereotactic guidance, a 9 gauge vacuum assisted device was used to
perform core needle biopsy of mass in the posterior central portion
of the RIGHT breast, associated with calcifications using a
craniocaudal approach. Specimen radiograph was performed showing
calcifications in specimens obtained. Specimens with calcifications
are identified for pathology.

Lesion quadrant: Posterior central RIGHT

At the conclusion of the procedure, X shaped tissue marker clip was
deployed into the biopsy cavity. Follow-up 2-view mammogram was
performed and dictated separately.
IMPRESSION: Stereotactic-guided biopsy of RIGHT breast mass. No apparent
complications.

## 2019-04-27 ENCOUNTER — Other Ambulatory Visit: Payer: Self-pay | Admitting: Adult Health

## 2019-04-27 ENCOUNTER — Other Ambulatory Visit: Payer: Self-pay | Admitting: General Surgery

## 2019-04-27 ENCOUNTER — Other Ambulatory Visit: Payer: Self-pay | Admitting: *Deleted

## 2019-04-27 DIAGNOSIS — R9389 Abnormal findings on diagnostic imaging of other specified body structures: Secondary | ICD-10-CM

## 2019-04-30 ENCOUNTER — Encounter: Payer: Self-pay | Admitting: Adult Health

## 2019-05-03 ENCOUNTER — Other Ambulatory Visit: Payer: Self-pay | Admitting: Adult Health

## 2019-05-03 DIAGNOSIS — D0502 Lobular carcinoma in situ of left breast: Secondary | ICD-10-CM

## 2019-05-03 MED ORDER — LORAZEPAM 0.5 MG PO TABS: 0.5000 mg | ORAL_TABLET | Freq: Once | ORAL | 0 refills | Status: DC | PRN

## 2019-05-03 NOTE — Progress Notes (Signed)
Mariah Haley and reviewed with her the fact that I sent in 0.5mg  of Lorazepam for her to take prior to breast biopsy.  I reviewed with her that she should have her husband drive her.  She understands.    Lorazepam 0.5mg  #2 prescribed.    Wilber Bihari, NP

## 2019-05-10 ENCOUNTER — Ambulatory Visit
Admission: RE | Admit: 2019-05-10 | Discharge: 2019-05-10 | Disposition: A | Discharge status: Home / Self Care | Payer: BC Managed Care – PPO | Source: Ambulatory Visit | Attending: Adult Health | Admitting: Adult Health

## 2019-05-10 ENCOUNTER — Other Ambulatory Visit: Payer: Self-pay

## 2019-05-10 ENCOUNTER — Other Ambulatory Visit: Payer: Self-pay | Admitting: Radiology

## 2019-05-10 DIAGNOSIS — R9389 Abnormal findings on diagnostic imaging of other specified body structures: Secondary | ICD-10-CM

## 2019-05-10 IMAGING — MR MR BREAST BX W LOC DEV 1ST LESION IMAGE BX SPEC MR GUIDE*L*
7 of 10 series · 32 of 48 positions shown · IV contrast (9 ml gadavist)
Comparison: Previous exams.
COMPARISON: Previous exams.

Addendum:
CLINICAL DATA: The patient presented for MRI biopsy of irregular
masses in the upper inner left breast and the inferior central left
breast seen on the [DATE] breast MRI.

The patient had ALH which was resected in [DATE].
The mass in the right breast seen at recent diagnostic mammography
was biopsied using stereotactic guidance demonstrating fibrocystic
change with calcifications.
EXAM:
MRI GUIDED CORE NEEDLE BIOPSY OF THE LEFT BREAST
TECHNIQUE: Multiplanar, multisequence MR imaging of the left breast was
performed both before and after administration of intravenous
contrast.
CONTRAST:  9mL GADAVIST GADOBUTROL 1 MMOL/ML IV SOLN

[Series 2: fiducial unilateral · sagittal · 2.0mm · 1.33mm/px · 3 of 52 slices shown]
[im 1/52]
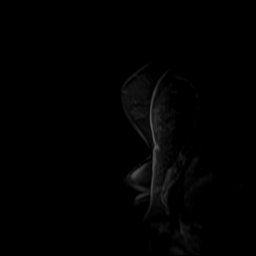
[im 26/52]
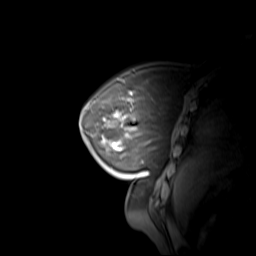
[im 52/52]
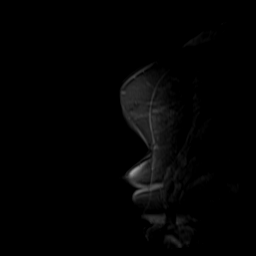

[Series 3: dynamic pre · axial · non-contrast · 1.3mm · 0.73mm/px · z∈[-44,+142]mm · 5 of 144 slices shown]
[im 1/144]
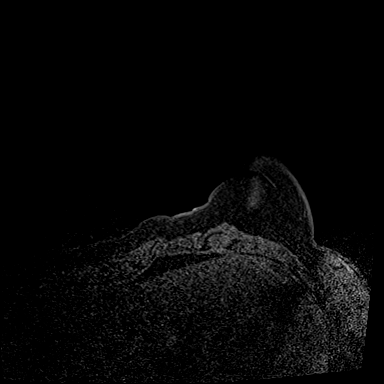
[im 36/144]
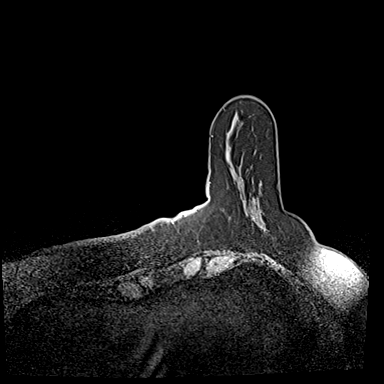
[im 72/144]
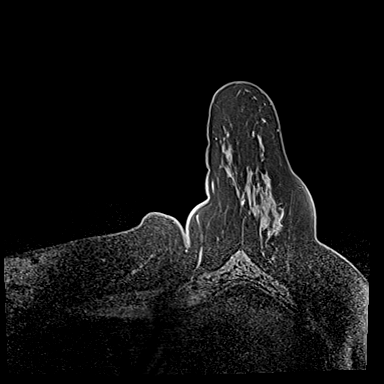
[im 108/144]
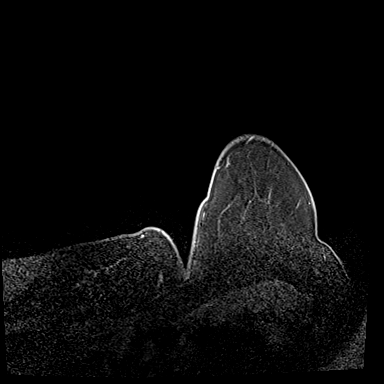
[im 144/144]
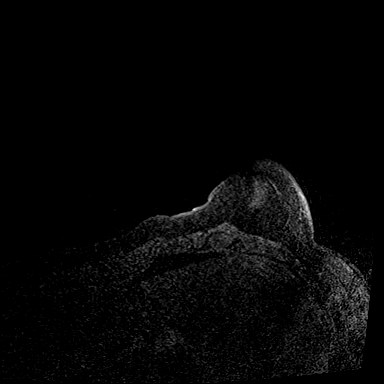

[Series 4: dynamic post 20 · axial · 1.3mm · 0.73mm/px · z∈[-44,+142]mm · 5 of 144 slices shown (1 of 2)]
[im 1/144]
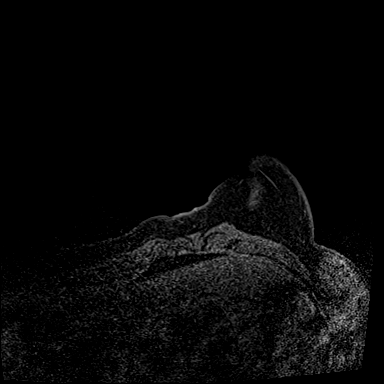
[im 36/144]
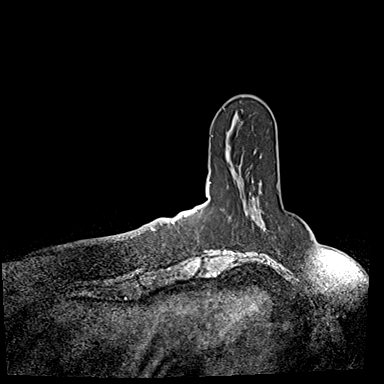
[im 72/144]
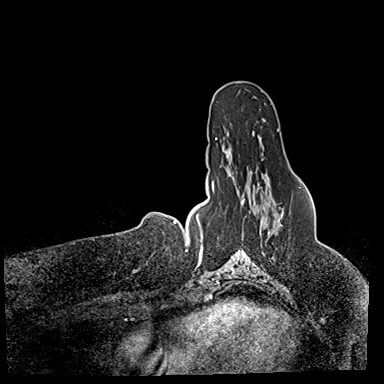
[im 108/144]
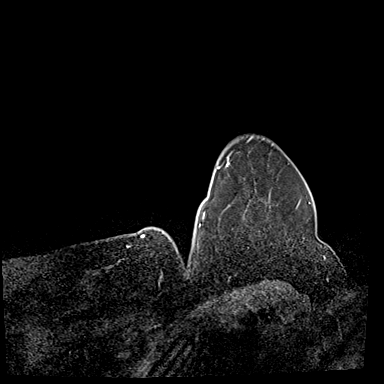
[im 144/144]
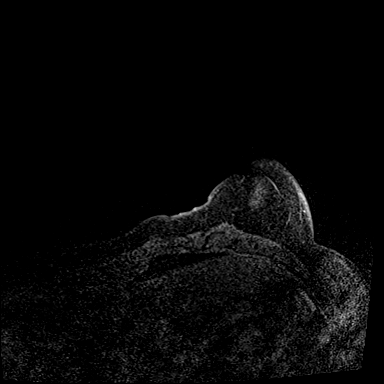

[Series 5: dynamic post 20 · axial · 1.3mm · 0.73mm/px · z∈[-44,+142]mm · 5 of 144 slices shown (2 of 2)]
[im 1/144]
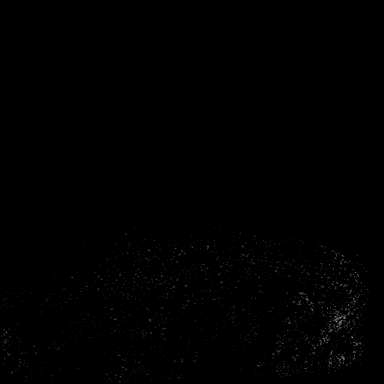
[im 36/144]
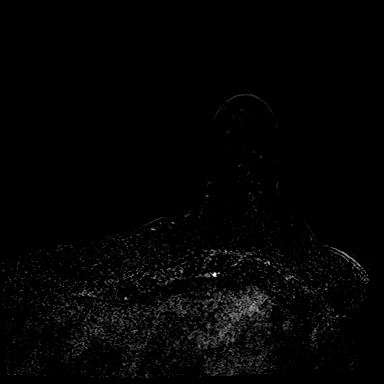
[im 72/144]
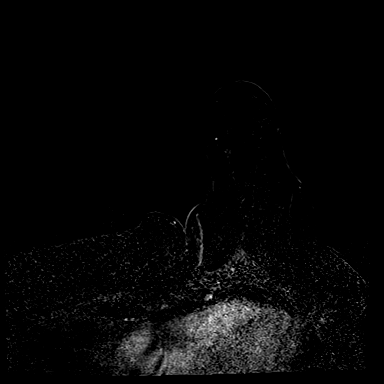
[im 108/144]
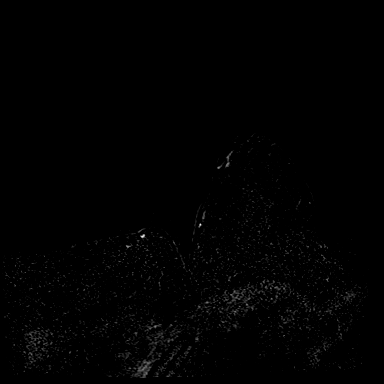
[im 144/144]
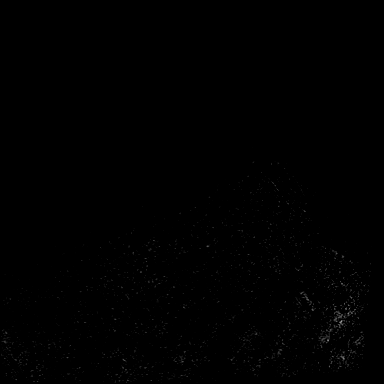

[Series 6: dynamic post 3 · axial · 1.3mm · 0.73mm/px · z∈[-44,+142]mm · 5 of 144 slices shown (1 of 2)]
[im 1/144]
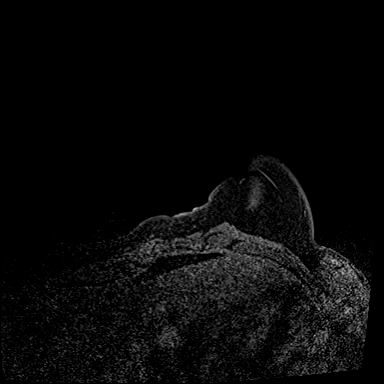
[im 36/144]
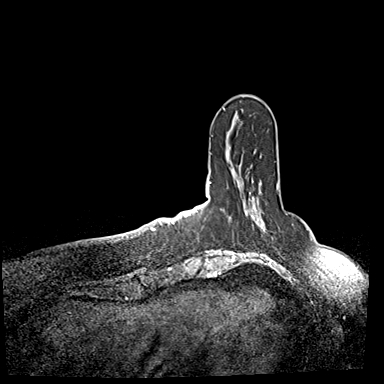
[im 72/144]
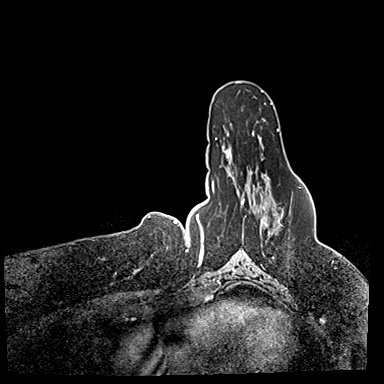
[im 108/144]
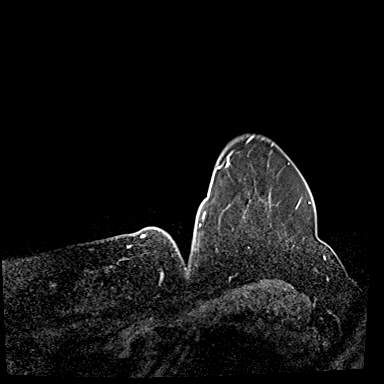
[im 144/144]
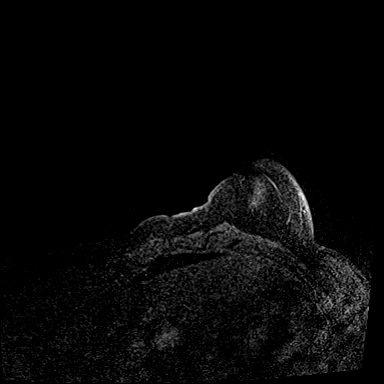

[Series 7: dynamic post 3 · axial · 1.3mm · 0.73mm/px · z∈[-44,+142]mm · 5 of 144 slices shown (2 of 2)]
[im 1/144]
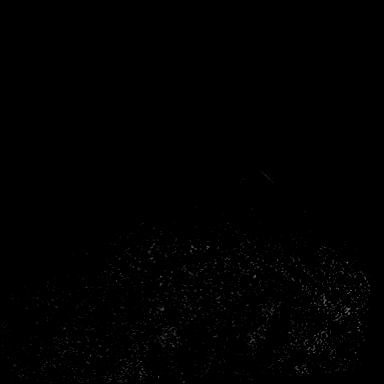
[im 36/144]
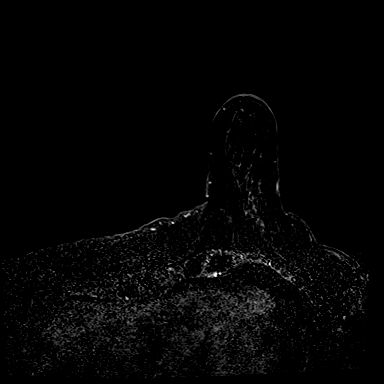
[im 72/144]
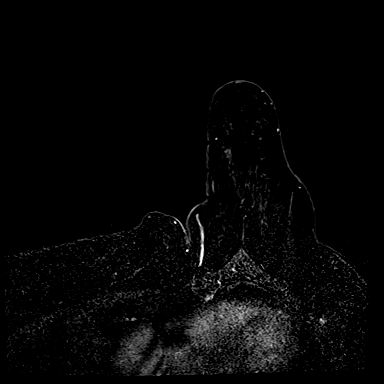
[im 108/144]
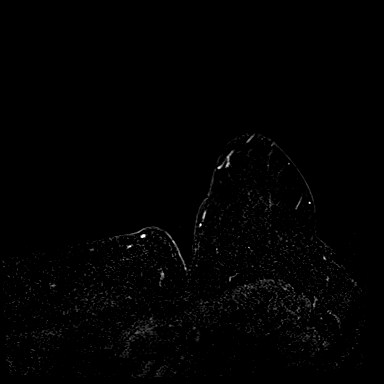
[im 144/144]
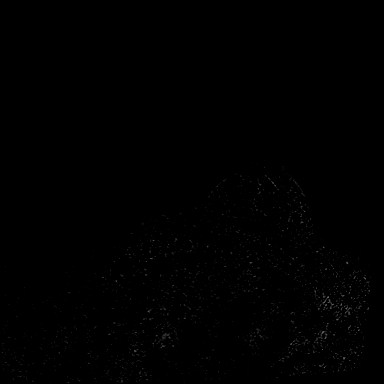

[Series 8: needle confirmation · axial · 1.3mm · 0.73mm/px · z∈[-44,+95]mm · 4 of 144 slices shown]
[im 1/144]
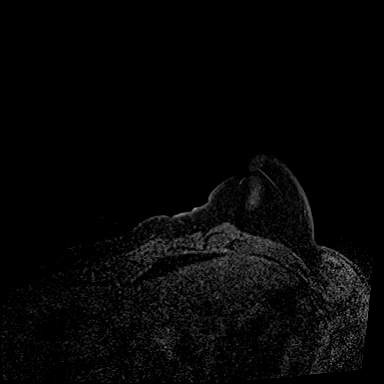
[im 36/144]
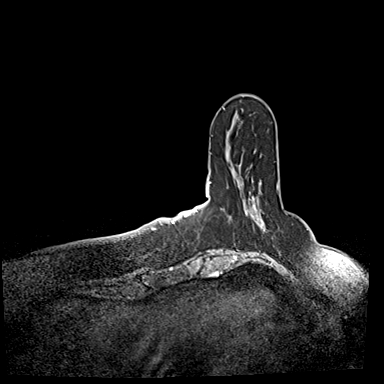
[im 72/144]
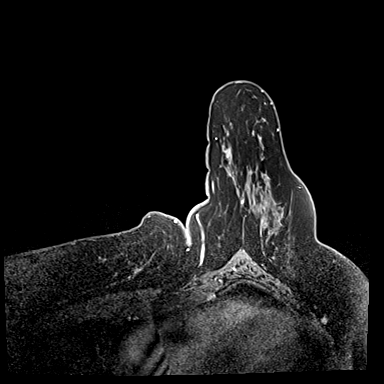
[im 108/144]
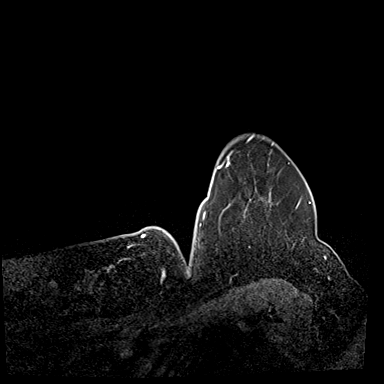

[32 of 48 positions shown; findings below may reference images not displayed]

FINDINGS: I met with the patient, and we discussed the procedure of MRI guided
biopsy, including risks, benefits, and alternatives. Specifically,
we discussed the risks of infection, bleeding, tissue injury, clip
migration, and inadequate sampling. Informed, written consent was
given. The usual time out protocol was performed immediately prior
to the procedure.

Using sterile technique, 1% Lidocaine, MRI guidance, and a 9 gauge
vacuum assisted device, biopsy was performed of the mass in the
upper inner left breast using a medial approach. At the conclusion
of the procedure, a cylinder shaped tissue marker clip was deployed
into the biopsy cavity. Follow-up 2-view mammogram was performed and
dictated separately.

The mass in the inferior central left breast was not visible on
today's imaging.
IMPRESSION: MRI guided biopsy of the left breast mass in the upper inner
quadrant. The mass in the inferior central left breast seen on the
[DATE] breast was not visualized on today's study. The
patient will need a 6 month follow-up breast MRI since the mass in
the inferior central left breast was not visible today. No apparent
complications.

ADDENDUM:
Pathology revealed LOBULAR NEOPLASIA (ATYPICAL LOBULAR HYPERPLASIA),
PREVIOUS BIOPSY SITE CHANGES of the Left breast, upper inner. This
was found to be concordant by Dr. SAMSONAITE.

Pathology results were discussed with the patient by telephone. The
patient reported doing well after the biopsy with tenderness at the
site. Post biopsy instructions and care were reviewed and questions
were answered. The patient was encouraged to call The [REDACTED]

Surgical consultation has been arranged with Dr. SAMSONAITE
at [REDACTED] on [DATE].

The patient was instructed to return for a 6 month follow-up breast
MRI, per protocol, since the mass in the inferior central left
breast was not visible at time of biopsy.

Pathology results reported by SAMSONAITE, RN on [DATE].

*** End of Addendum ***
FINDINGS: I met with the patient, and we discussed the procedure of MRI guided
biopsy, including risks, benefits, and alternatives. Specifically,
we discussed the risks of infection, bleeding, tissue injury, clip
migration, and inadequate sampling. Informed, written consent was
given. The usual time out protocol was performed immediately prior
to the procedure.

Using sterile technique, 1% Lidocaine, MRI guidance, and a 9 gauge
vacuum assisted device, biopsy was performed of the mass in the
upper inner left breast using a medial approach. At the conclusion
of the procedure, a cylinder shaped tissue marker clip was deployed
into the biopsy cavity. Follow-up 2-view mammogram was performed and
dictated separately.

The mass in the inferior central left breast was not visible on
today's imaging.
IMPRESSION: MRI guided biopsy of the left breast mass in the upper inner
quadrant. The mass in the inferior central left breast seen on the
[DATE] breast was not visualized on today's study. The
patient will need a 6 month follow-up breast MRI since the mass in
the inferior central left breast was not visible today. No apparent
complications.

## 2019-05-10 IMAGING — MG MM BREAST LOCALIZATION CLIP
4 series · 4 of 12 positions shown · non-contrast
Comparison: Previous exam(s).

CLINICAL DATA: Evaluate biopsy marker after MRI guided biopsy.

EXAM:
DIAGNOSTIC LEFT MAMMOGRAM POST MRI BIOPSY

[L ML synth-2D]
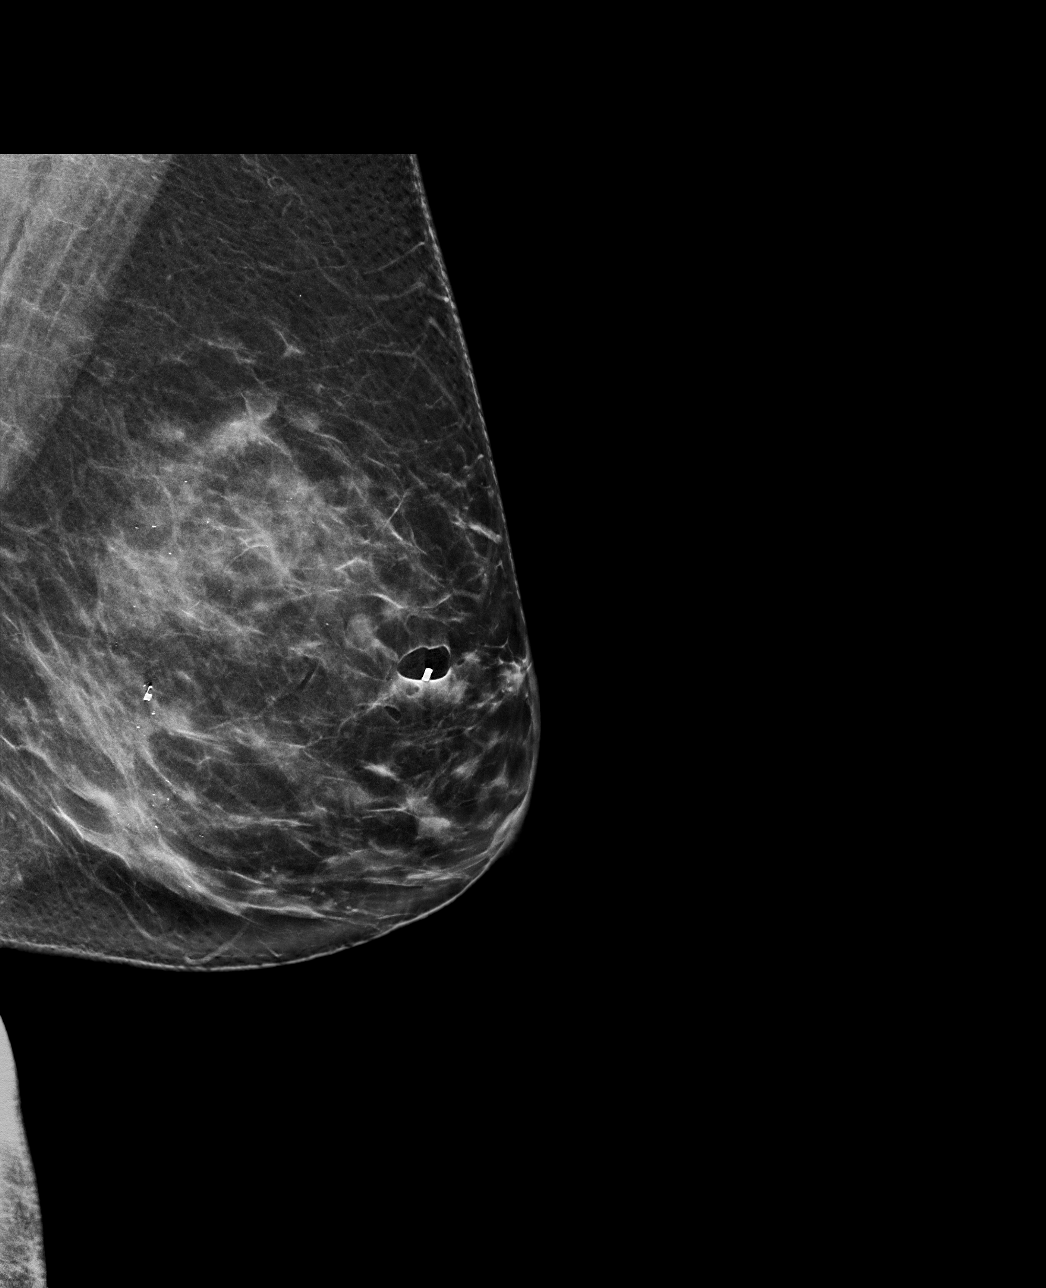

[L CC synth-2D]
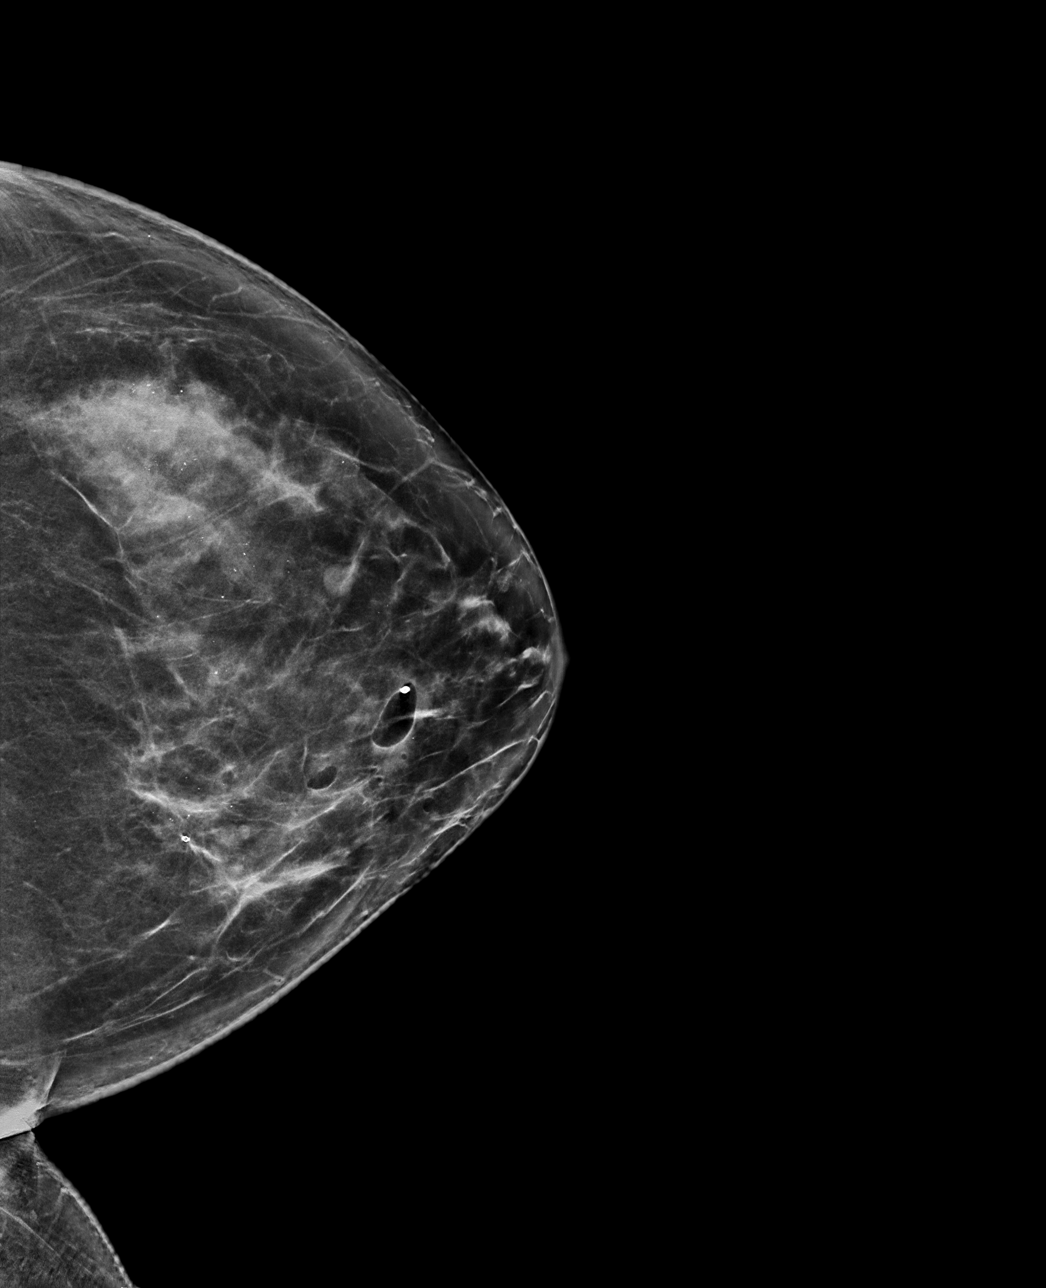

[L CC tomo · tomo slice 40/79.0]
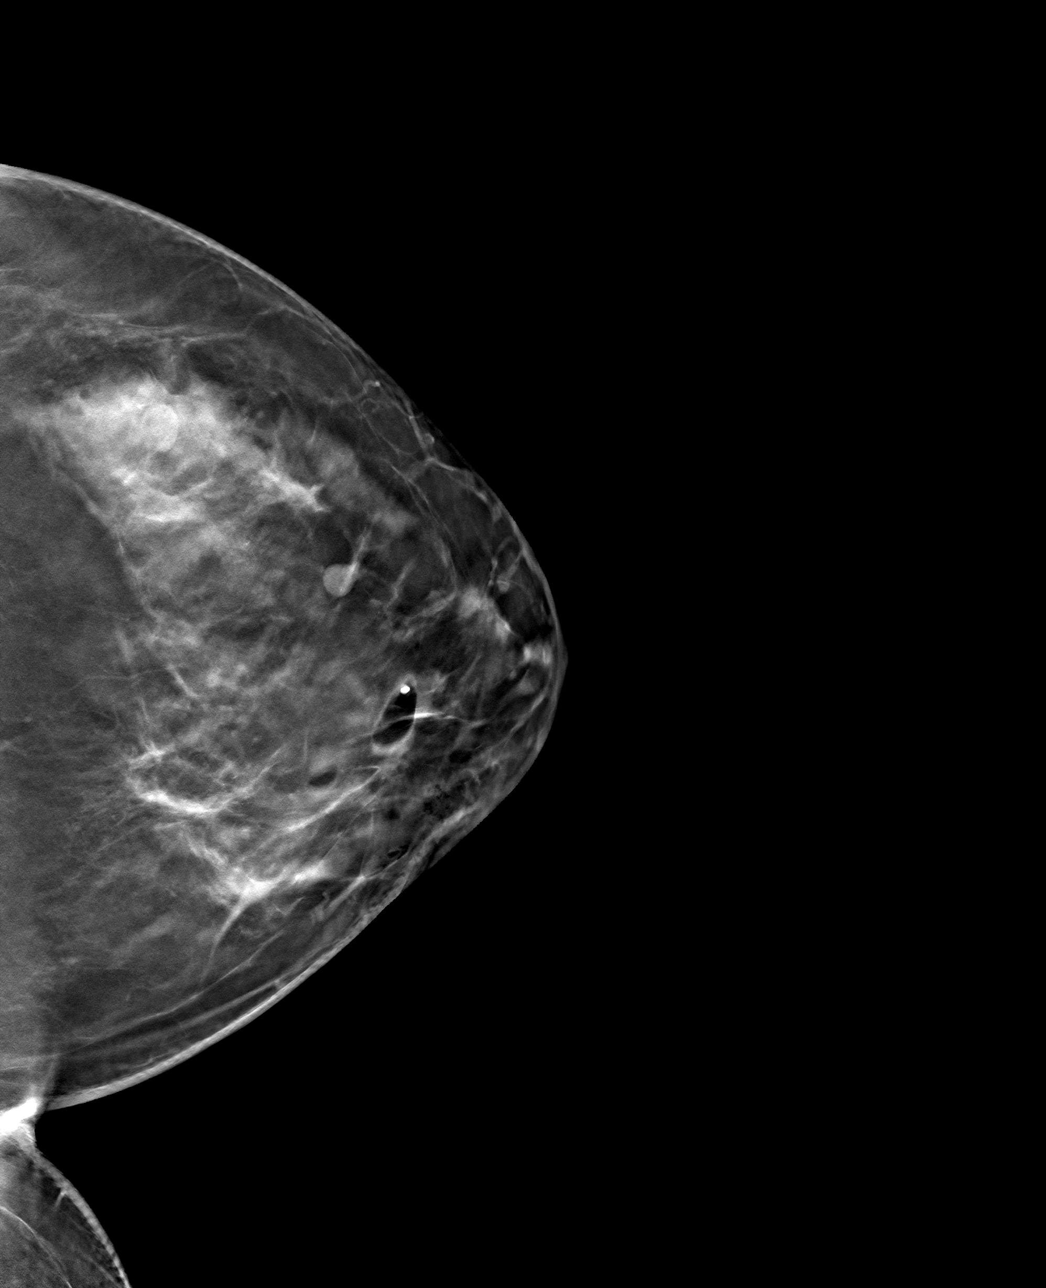

[L ML tomo · tomo slice 41/80.0]
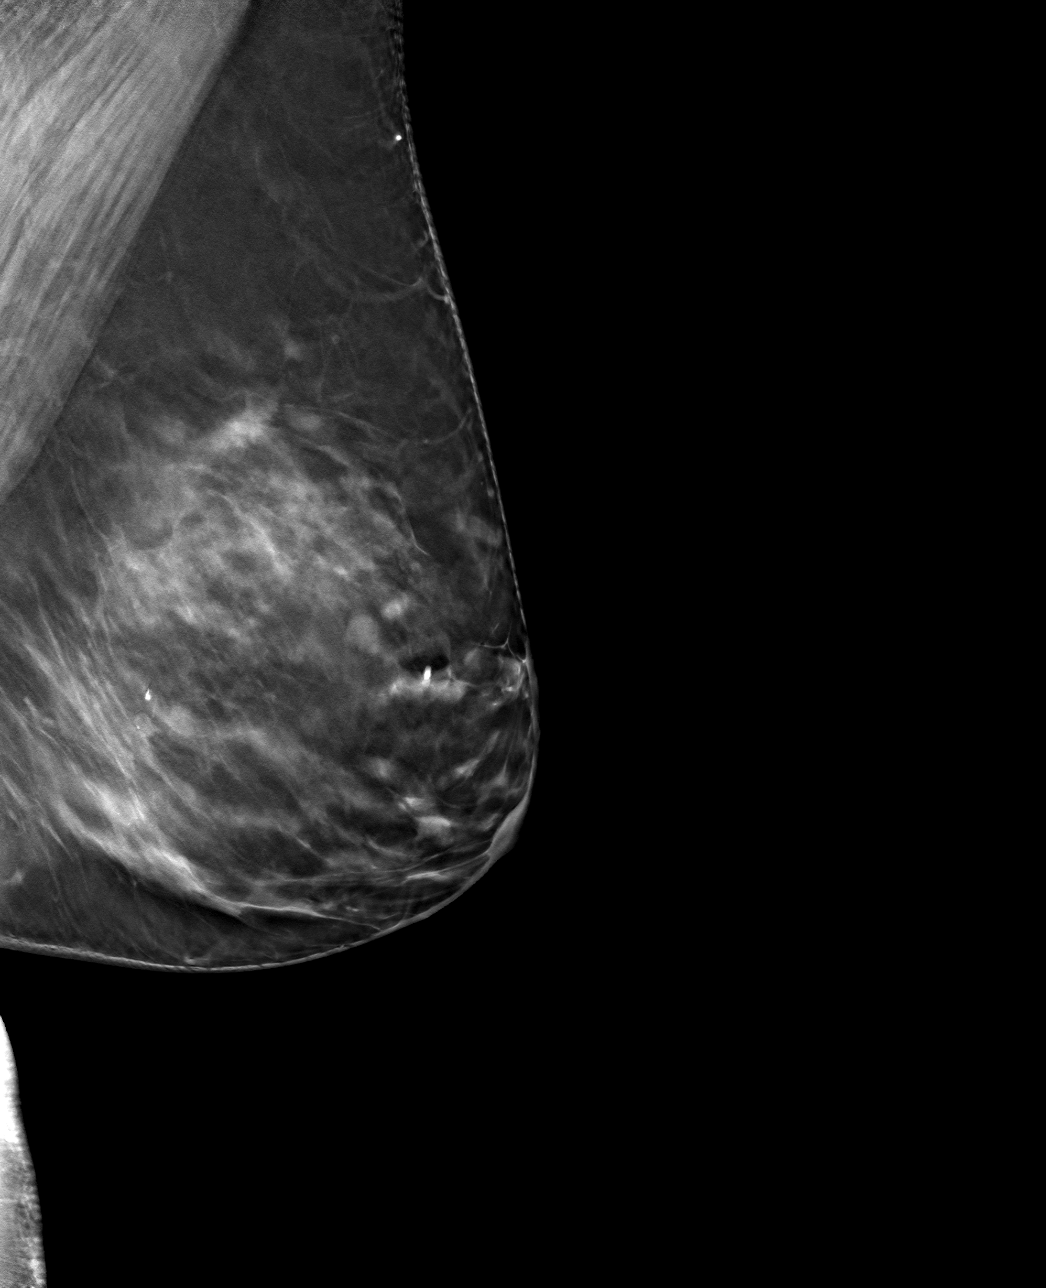

[4 of 12 positions shown; findings below may reference images not displayed]

FINDINGS: Mammographic images were obtained following MRI guided biopsy of a
mass in the upper inner anterior left breast. The biopsy marking
clip is in expected position at the site of biopsy.
IMPRESSION: Appropriate positioning of the cylinder shaped biopsy marking clip
at the site of biopsy in the upper inner anterior left breast.

Final Assessment: Post Procedure Mammograms for Marker Placement

## 2019-05-10 MED ORDER — GADOBUTROL 1 MMOL/ML IV SOLN
9.0000 mL | Freq: Once | INTRAVENOUS | Status: AC | PRN
  Administered 2019-05-10: 9 mL via INTRAVENOUS

## 2019-06-08 ENCOUNTER — Other Ambulatory Visit: Payer: Self-pay | Admitting: General Surgery

## 2019-06-08 DIAGNOSIS — N6092 Unspecified benign mammary dysplasia of left breast: Secondary | ICD-10-CM

## 2019-06-14 ENCOUNTER — Other Ambulatory Visit: Payer: Self-pay | Admitting: General Surgery

## 2019-06-14 DIAGNOSIS — N6092 Unspecified benign mammary dysplasia of left breast: Secondary | ICD-10-CM

## 2019-06-25 HISTORY — PX: BREAST EXCISIONAL BIOPSY: SUR124

## 2019-07-10 ENCOUNTER — Encounter (HOSPITAL_BASED_OUTPATIENT_CLINIC_OR_DEPARTMENT_OTHER): Payer: Self-pay | Admitting: General Surgery

## 2019-07-10 ENCOUNTER — Other Ambulatory Visit: Payer: Self-pay

## 2019-07-13 ENCOUNTER — Other Ambulatory Visit (HOSPITAL_COMMUNITY)
Admission: RE | Admit: 2019-07-13 | Discharge: 2019-07-13 | Disposition: A | Discharge status: Home / Self Care | Payer: BC Managed Care – PPO | Source: Ambulatory Visit | Attending: General Surgery | Admitting: General Surgery

## 2019-07-13 DIAGNOSIS — Z20822 Contact with and (suspected) exposure to covid-19: Secondary | ICD-10-CM | POA: Insufficient documentation

## 2019-07-13 DIAGNOSIS — Z01812 Encounter for preprocedural laboratory examination: Secondary | ICD-10-CM | POA: Insufficient documentation

## 2019-07-13 LAB — SARS CORONAVIRUS 2 (TAT 6-24 HRS): SARS Coronavirus 2: NEGATIVE

## 2019-07-16 ENCOUNTER — Ambulatory Visit
Admission: RE | Admit: 2019-07-16 | Discharge: 2019-07-16 | Disposition: A | Discharge status: Home / Self Care | Payer: BC Managed Care – PPO | Source: Ambulatory Visit | Attending: General Surgery | Admitting: General Surgery

## 2019-07-16 ENCOUNTER — Other Ambulatory Visit: Payer: Self-pay

## 2019-07-16 DIAGNOSIS — N6092 Unspecified benign mammary dysplasia of left breast: Secondary | ICD-10-CM

## 2019-07-16 IMAGING — MG MM PLC BREAST LOC DEV 1ST LESION INC MAMMO GUIDE*L*
7 series · 7 of 7 positions shown · non-contrast
Comparison: Previous exam(s).

CLINICAL DATA: Patient presents for radioactive seed localization
area of lobular neoplasia in the left breast biopsied under MRI
guidance on [DATE].

EXAM:
MAMMOGRAPHIC GUIDED RADIOACTIVE SEED LOCALIZATION OF THE LEFT BREAST

[L CC (1 of 3)]
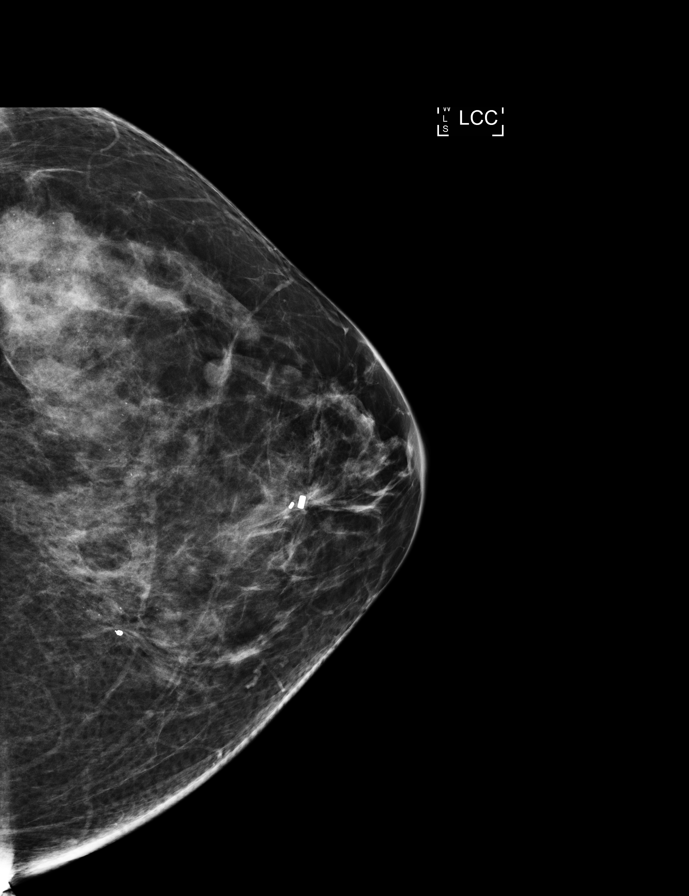

[L ML (1 of 4)]
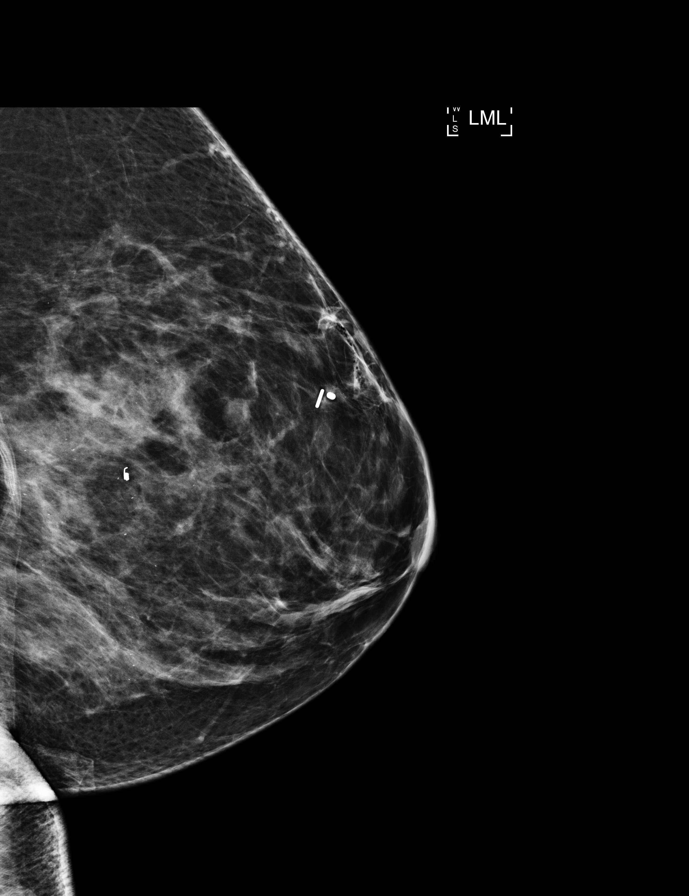

[L CC (2 of 3)]
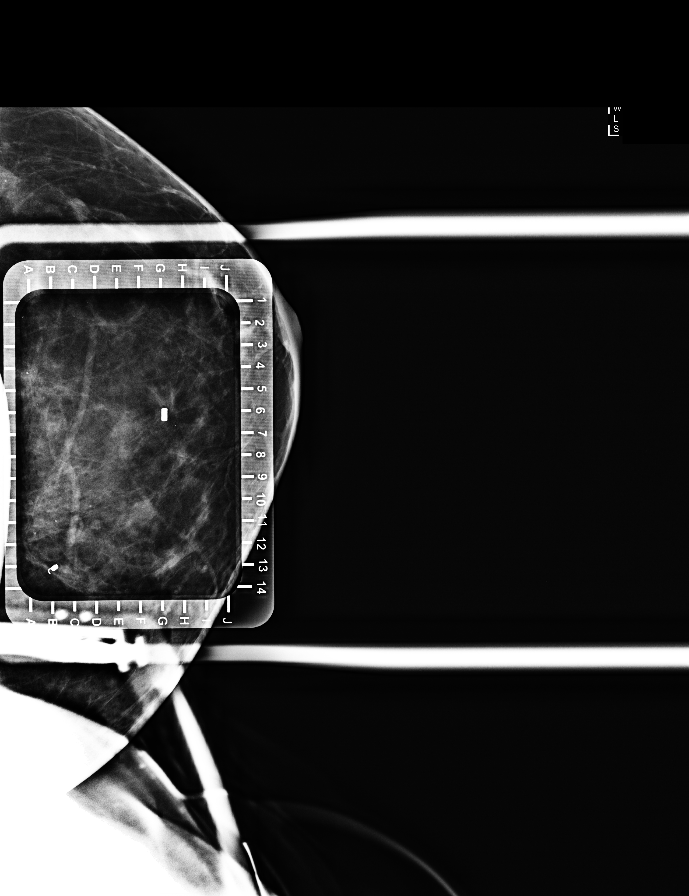

[L ML (2 of 4)]
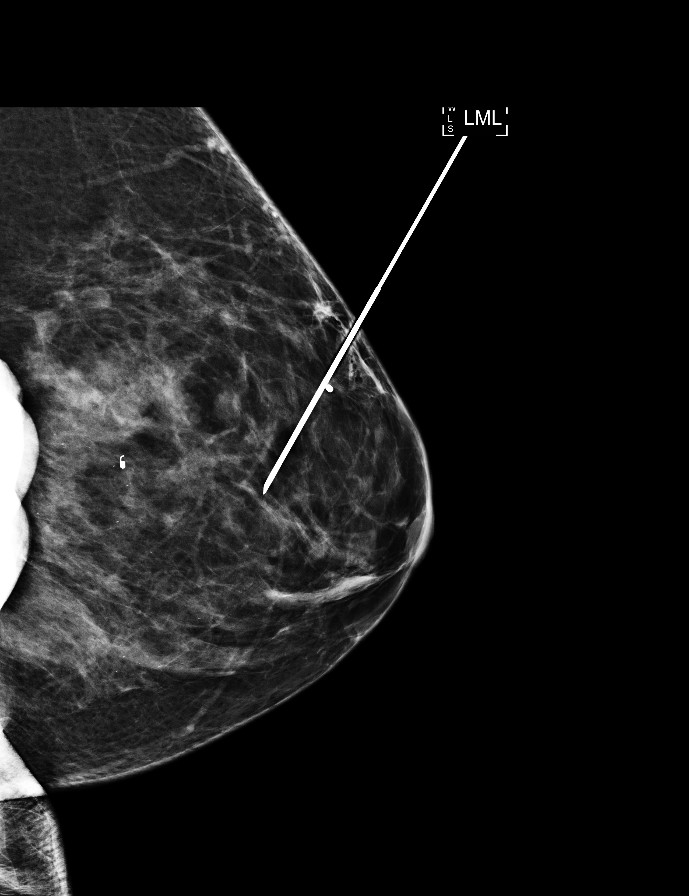

[L ML (3 of 4)]
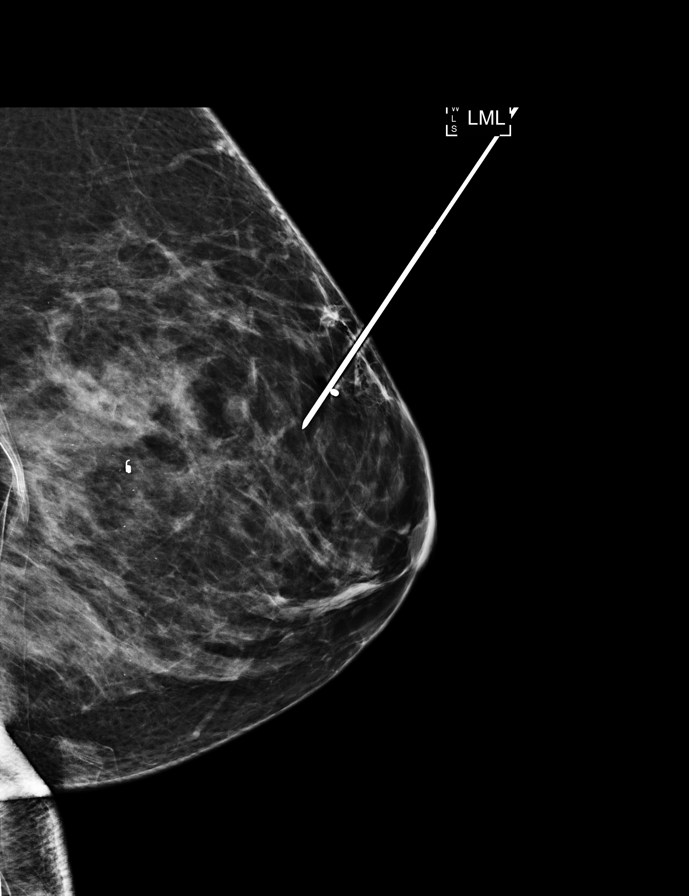

[L ML (4 of 4)]
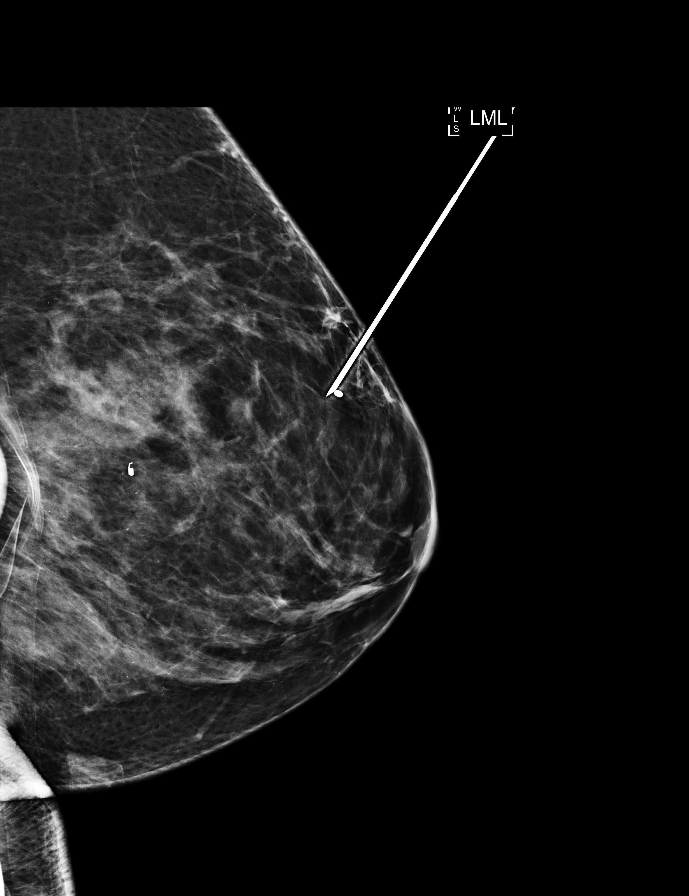

[L CC (3 of 3)]
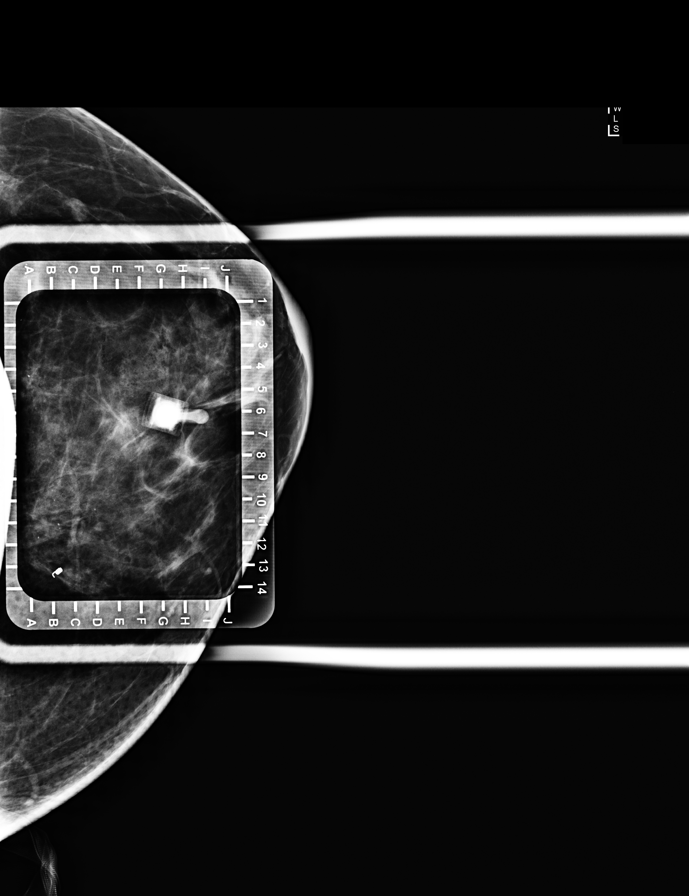

[7 of 7 positions shown; findings below may reference images not displayed]

FINDINGS: Patient presents for radioactive seed localization prior to surgical
excision. I met with the patient and we discussed the procedure of
seed localization including benefits and alternatives. We discussed
the high likelihood of a successful procedure. We discussed the
risks of the procedure including infection, bleeding, tissue injury
and further surgery. We discussed the low dose of radioactivity
involved in the procedure. Informed, written consent was given.

The usual time-out protocol was performed immediately prior to the
procedure.

Using mammographic guidance, sterile technique, 1% lidocaine and an
[R3] radioactive seed, the cylinder shaped biopsy clip in the upper
left breast was localized using a superior approach. The follow-up
mammogram images confirm the seed in the expected location and were
marked for Dr. OMARU.

Follow-up survey of the patient confirms presence of the radioactive
seed.

Order number of [R3] seed:  [PHONE_NUMBER].

Total activity:  0.251 millicuries reference Date: [DATE]

The patient tolerated the procedure well and was released from the
[REDACTED]. She was given instructions regarding seed removal.
IMPRESSION: Radioactive seed localization of the left breast. No apparent
complications.

## 2019-07-16 MED ORDER — ENSURE PRE-SURGERY PO LIQD: 296.0000 mL | Freq: Once | ORAL | Status: DC

## 2019-07-16 NOTE — Progress Notes (Signed)

## 2019-07-17 ENCOUNTER — Encounter (HOSPITAL_BASED_OUTPATIENT_CLINIC_OR_DEPARTMENT_OTHER)
Admission: RE | Disposition: A | Discharge status: Home / Self Care | Payer: Self-pay | Source: Home / Self Care | Attending: General Surgery

## 2019-07-17 ENCOUNTER — Other Ambulatory Visit: Payer: Self-pay

## 2019-07-17 ENCOUNTER — Ambulatory Visit (HOSPITAL_BASED_OUTPATIENT_CLINIC_OR_DEPARTMENT_OTHER): Payer: BC Managed Care – PPO | Admitting: Anesthesiology

## 2019-07-17 ENCOUNTER — Ambulatory Visit (HOSPITAL_BASED_OUTPATIENT_CLINIC_OR_DEPARTMENT_OTHER)
Admission: RE | Admit: 2019-07-17 | Discharge: 2019-07-17 | Disposition: A | Discharge status: Home / Self Care | Payer: BC Managed Care – PPO | Attending: General Surgery | Admitting: General Surgery

## 2019-07-17 ENCOUNTER — Ambulatory Visit
Admission: RE | Admit: 2019-07-17 | Discharge: 2019-07-17 | Disposition: A | Discharge status: Home / Self Care | Payer: BC Managed Care – PPO | Source: Ambulatory Visit | Attending: General Surgery | Admitting: General Surgery

## 2019-07-17 ENCOUNTER — Encounter (HOSPITAL_BASED_OUTPATIENT_CLINIC_OR_DEPARTMENT_OTHER): Payer: Self-pay | Admitting: General Surgery

## 2019-07-17 DIAGNOSIS — Z6837 Body mass index (BMI) 37.0-37.9, adult: Secondary | ICD-10-CM | POA: Diagnosis not present

## 2019-07-17 DIAGNOSIS — Z79810 Long term (current) use of selective estrogen receptor modulators (SERMs): Secondary | ICD-10-CM | POA: Insufficient documentation

## 2019-07-17 DIAGNOSIS — E669 Obesity, unspecified: Secondary | ICD-10-CM | POA: Diagnosis not present

## 2019-07-17 DIAGNOSIS — N6092 Unspecified benign mammary dysplasia of left breast: Secondary | ICD-10-CM | POA: Diagnosis present

## 2019-07-17 DIAGNOSIS — Z79899 Other long term (current) drug therapy: Secondary | ICD-10-CM | POA: Insufficient documentation

## 2019-07-17 HISTORY — PX: RADIOACTIVE SEED GUIDED EXCISIONAL BREAST BIOPSY: SHX6490

## 2019-07-17 IMAGING — DX MM BREAST SURGICAL SPECIMEN
1 series · 2 of 2 positions shown · non-contrast
Comparison: Previous exam(s).

CLINICAL DATA: Specimen mammogram from surgical excision of a left
breast lesion following radioactive seed localization.

EXAM:
SPECIMEN RADIOGRAPH OF THE LEFT BREAST

[Series 1: specimen digital x-ray · left · 0.10mm/px · 2 of 2 slices shown]
[im 1/2]
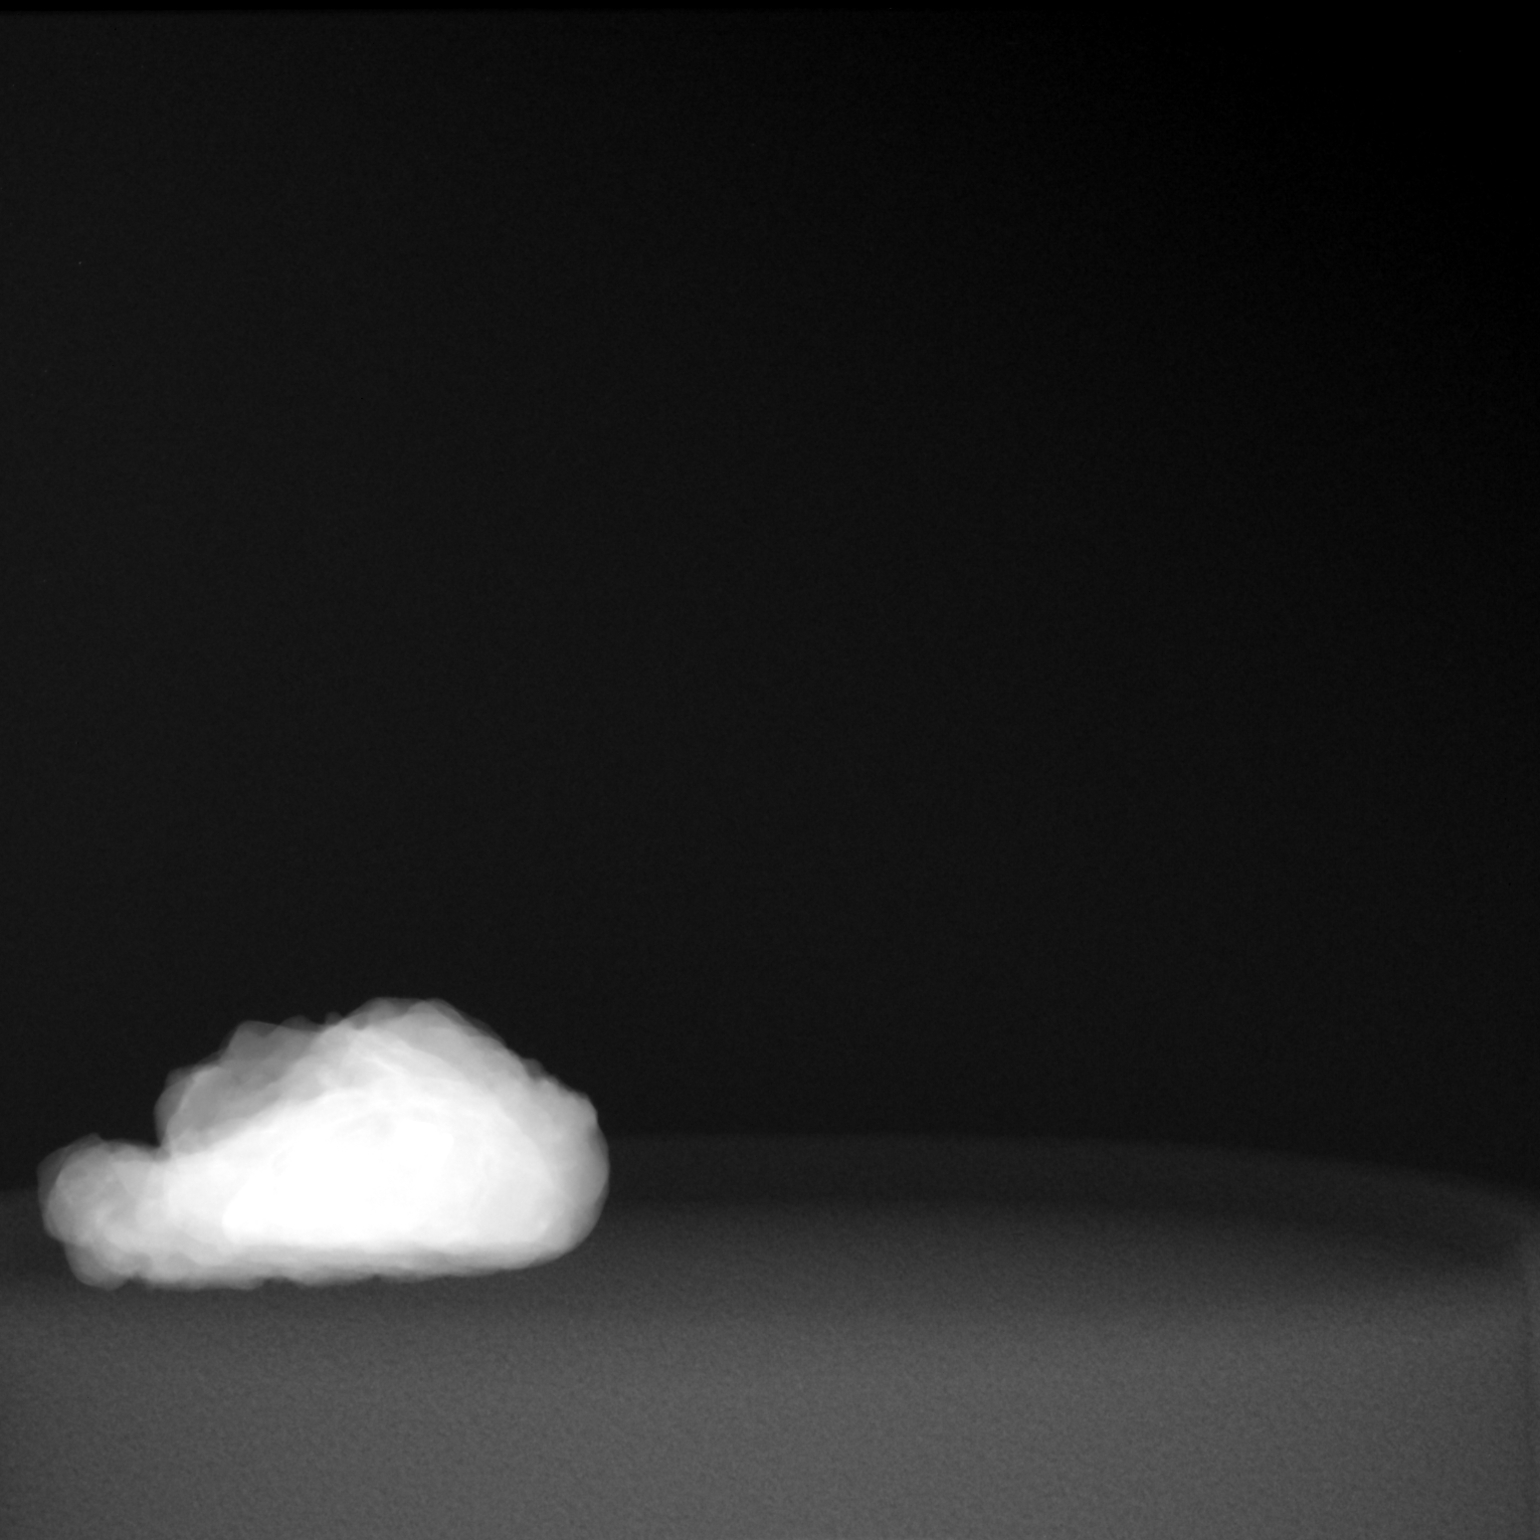
[im 2/2]
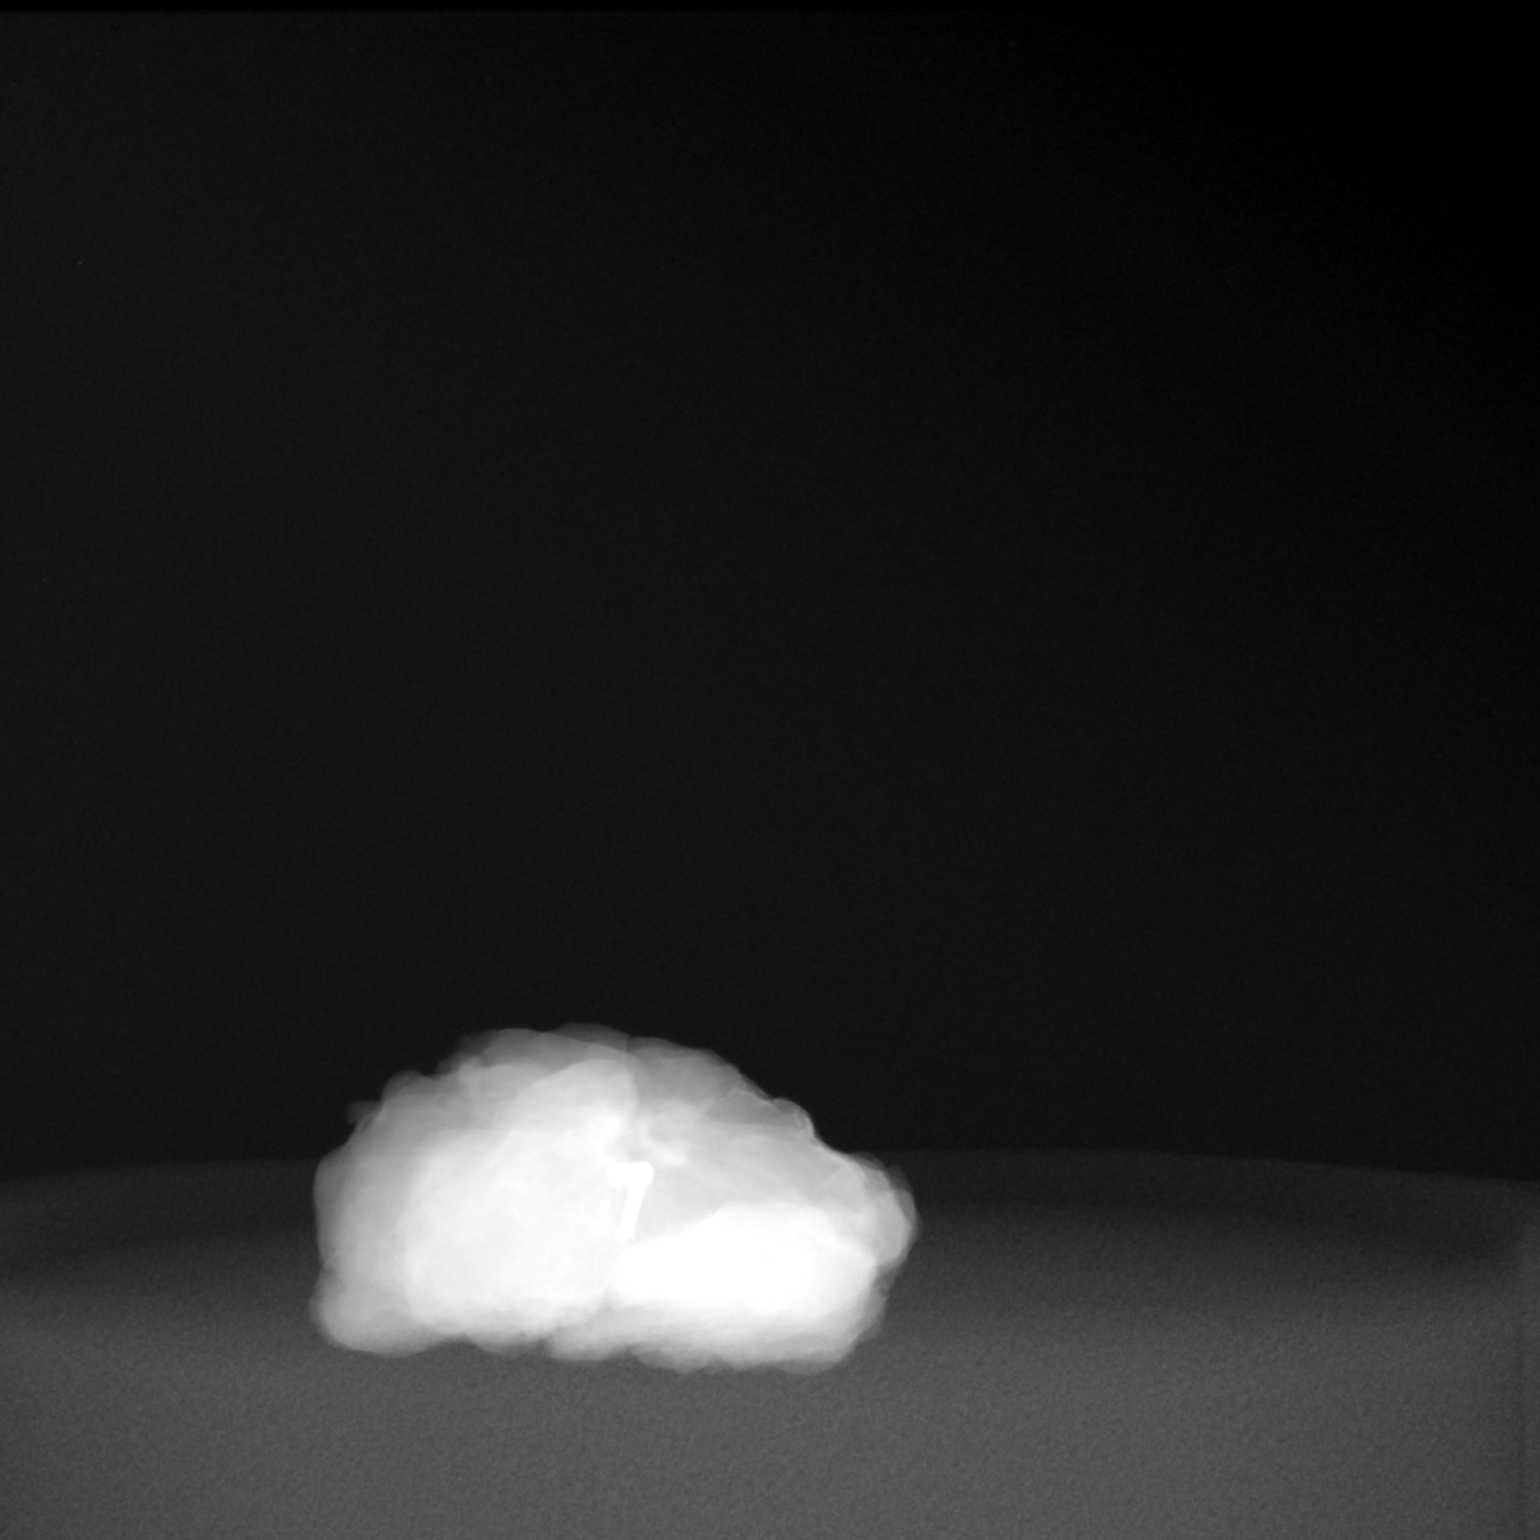

[2 of 2 positions shown; findings below may reference images not displayed]

FINDINGS: Status post excision of the left breast. The radioactive seed and
biopsy marker clip are present, completely intact, and were marked
for pathology.
IMPRESSION: Specimen radiograph of the left breast.

## 2019-07-17 SURGERY — RADIOACTIVE SEED GUIDED BREAST BIOPSY
Anesthesia: General | Site: Breast | Laterality: Left

## 2019-07-17 MED ORDER — OXYCODONE HCL 5 MG PO TABS
ORAL_TABLET | ORAL | Status: AC
  Filled 2019-07-17: qty 1

## 2019-07-17 MED ORDER — LIDOCAINE 2% (20 MG/ML) 5 ML SYRINGE
INTRAMUSCULAR | Status: DC | PRN
  Administered 2019-07-17: 80 mg via INTRAVENOUS

## 2019-07-17 MED ORDER — DEXAMETHASONE SODIUM PHOSPHATE 10 MG/ML IJ SOLN
INTRAMUSCULAR | Status: DC | PRN
  Administered 2019-07-17: 10 mg via INTRAVENOUS

## 2019-07-17 MED ORDER — EPHEDRINE SULFATE-NACL 50-0.9 MG/10ML-% IV SOSY
PREFILLED_SYRINGE | INTRAVENOUS | Status: DC | PRN
  Administered 2019-07-17: 10 mg via INTRAVENOUS

## 2019-07-17 MED ORDER — FENTANYL CITRATE (PF) 100 MCG/2ML IJ SOLN: 25.0000 ug | INTRAMUSCULAR | Status: DC | PRN

## 2019-07-17 MED ORDER — DEXAMETHASONE SODIUM PHOSPHATE 10 MG/ML IJ SOLN
INTRAMUSCULAR | Status: AC
  Filled 2019-07-17: qty 1

## 2019-07-17 MED ORDER — ONDANSETRON HCL 4 MG/2ML IJ SOLN
INTRAMUSCULAR | Status: AC
  Filled 2019-07-17: qty 2

## 2019-07-17 MED ORDER — PROPOFOL 10 MG/ML IV BOLUS
INTRAVENOUS | Status: DC | PRN
  Administered 2019-07-17: 200 mg via INTRAVENOUS

## 2019-07-17 MED ORDER — FENTANYL CITRATE (PF) 100 MCG/2ML IJ SOLN
INTRAMUSCULAR | Status: DC | PRN
  Administered 2019-07-17 (×2): 25 ug via INTRAVENOUS
  Administered 2019-07-17: 50 ug via INTRAVENOUS

## 2019-07-17 MED ORDER — LIDOCAINE 2% (20 MG/ML) 5 ML SYRINGE
INTRAMUSCULAR | Status: AC
  Filled 2019-07-17: qty 5

## 2019-07-17 MED ORDER — GABAPENTIN 100 MG PO CAPS
100.0000 mg | ORAL_CAPSULE | ORAL | Status: AC
  Administered 2019-07-17: 100 mg via ORAL

## 2019-07-17 MED ORDER — FENTANYL CITRATE (PF) 100 MCG/2ML IJ SOLN
INTRAMUSCULAR | Status: AC
  Filled 2019-07-17: qty 2

## 2019-07-17 MED ORDER — PROMETHAZINE HCL 25 MG/ML IJ SOLN: 6.2500 mg | INTRAMUSCULAR | Status: DC | PRN

## 2019-07-17 MED ORDER — MIDAZOLAM HCL 2 MG/2ML IJ SOLN
INTRAMUSCULAR | Status: AC
  Filled 2019-07-17: qty 2

## 2019-07-17 MED ORDER — ONDANSETRON HCL 4 MG/2ML IJ SOLN
INTRAMUSCULAR | Status: DC | PRN
  Administered 2019-07-17: 4 mg via INTRAVENOUS

## 2019-07-17 MED ORDER — GABAPENTIN 100 MG PO CAPS
ORAL_CAPSULE | ORAL | Status: AC
  Filled 2019-07-17: qty 1

## 2019-07-17 MED ORDER — BUPIVACAINE HCL (PF) 0.25 % IJ SOLN
INTRAMUSCULAR | Status: DC | PRN
  Administered 2019-07-17: 9 mL

## 2019-07-17 MED ORDER — PROPOFOL 10 MG/ML IV BOLUS
INTRAVENOUS | Status: AC
  Filled 2019-07-17: qty 20

## 2019-07-17 MED ORDER — EPHEDRINE 5 MG/ML INJ
INTRAVENOUS | Status: AC
  Filled 2019-07-17: qty 10

## 2019-07-17 MED ORDER — PHENYLEPHRINE 40 MCG/ML (10ML) SYRINGE FOR IV PUSH (FOR BLOOD PRESSURE SUPPORT)
PREFILLED_SYRINGE | INTRAVENOUS | Status: AC
  Filled 2019-07-17: qty 10

## 2019-07-17 MED ORDER — ACETAMINOPHEN 500 MG PO TABS
ORAL_TABLET | ORAL | Status: AC
  Filled 2019-07-17: qty 2

## 2019-07-17 MED ORDER — CEFAZOLIN SODIUM-DEXTROSE 2-4 GM/100ML-% IV SOLN
INTRAVENOUS | Status: AC
  Filled 2019-07-17: qty 100

## 2019-07-17 MED ORDER — ACETAMINOPHEN 500 MG PO TABS
1000.0000 mg | ORAL_TABLET | ORAL | Status: AC
  Administered 2019-07-17: 1000 mg via ORAL

## 2019-07-17 MED ORDER — OXYCODONE HCL 5 MG PO TABS
5.0000 mg | ORAL_TABLET | Freq: Once | ORAL | Status: AC
  Administered 2019-07-17: 5 mg via ORAL

## 2019-07-17 MED ORDER — CEFAZOLIN SODIUM-DEXTROSE 2-4 GM/100ML-% IV SOLN
2.0000 g | INTRAVENOUS | Status: AC
  Administered 2019-07-17: 10:00:00 2 g via INTRAVENOUS

## 2019-07-17 MED ORDER — MIDAZOLAM HCL 5 MG/5ML IJ SOLN
INTRAMUSCULAR | Status: DC | PRN
  Administered 2019-07-17: 2 mg via INTRAVENOUS

## 2019-07-17 MED ORDER — LACTATED RINGERS IV SOLN: INTRAVENOUS | Status: DC

## 2019-07-17 SURGICAL SUPPLY — 60 items
APPLIER CLIP 9.375 MED OPEN (MISCELLANEOUS)
BINDER BREAST XLRG (GAUZE/BANDAGES/DRESSINGS) ×2 IMPLANT
BINDER BREAST XXLRG (GAUZE/BANDAGES/DRESSINGS) IMPLANT
BLADE SURG 15 STRL LF DISP TIS (BLADE) ×1 IMPLANT
BLADE SURG 15 STRL SS (BLADE) ×2
CANISTER SUC SOCK COL 7IN (MISCELLANEOUS) IMPLANT
CANISTER SUCT 1200ML W/VALVE (MISCELLANEOUS) IMPLANT
CHLORAPREP W/TINT 26 (MISCELLANEOUS) ×3 IMPLANT
CLIP APPLIE 9.375 MED OPEN (MISCELLANEOUS) IMPLANT
CLIP VESOCCLUDE SM WIDE 6/CT (CLIP) IMPLANT
CLOSURE WOUND 1/2 X4 (GAUZE/BANDAGES/DRESSINGS) ×1
COVER BACK TABLE 60X90IN (DRAPES) ×3 IMPLANT
COVER MAYO STAND STRL (DRAPES) ×3 IMPLANT
COVER PROBE W GEL 5X96 (DRAPES) ×3 IMPLANT
DECANTER SPIKE VIAL GLASS SM (MISCELLANEOUS) IMPLANT
DERMABOND ADVANCED (GAUZE/BANDAGES/DRESSINGS) ×2
DERMABOND ADVANCED .7 DNX12 (GAUZE/BANDAGES/DRESSINGS) ×1 IMPLANT
DRAPE LAPAROSCOPIC ABDOMINAL (DRAPES) ×3 IMPLANT
DRAPE UTILITY XL STRL (DRAPES) ×3 IMPLANT
DRSG TEGADERM 4X4.75 (GAUZE/BANDAGES/DRESSINGS) IMPLANT
ELECT COATED BLADE 2.86 ST (ELECTRODE) ×3 IMPLANT
ELECT REM PT RETURN 9FT ADLT (ELECTROSURGICAL) ×3
ELECTRODE REM PT RTRN 9FT ADLT (ELECTROSURGICAL) ×1 IMPLANT
GAUZE SPONGE 4X4 12PLY STRL LF (GAUZE/BANDAGES/DRESSINGS) IMPLANT
GLOVE BIO SURGEON STRL SZ 6.5 (GLOVE) ×1 IMPLANT
GLOVE BIO SURGEON STRL SZ7 (GLOVE) ×8 IMPLANT
GLOVE BIO SURGEON STRL SZ7.5 (GLOVE) ×2 IMPLANT
GLOVE BIO SURGEONS STRL SZ 6.5 (GLOVE) ×1
GLOVE BIOGEL PI IND STRL 6.5 (GLOVE) IMPLANT
GLOVE BIOGEL PI IND STRL 7.5 (GLOVE) ×1 IMPLANT
GLOVE BIOGEL PI IND STRL 8 (GLOVE) IMPLANT
GLOVE BIOGEL PI INDICATOR 6.5 (GLOVE) ×2
GLOVE BIOGEL PI INDICATOR 7.5 (GLOVE) ×4
GLOVE BIOGEL PI INDICATOR 8 (GLOVE) ×2
GOWN STRL REUS W/ TWL LRG LVL3 (GOWN DISPOSABLE) ×2 IMPLANT
GOWN STRL REUS W/TWL LRG LVL3 (GOWN DISPOSABLE) ×8
HEMOSTAT ARISTA ABSORB 3G PWDR (HEMOSTASIS) IMPLANT
KIT MARKER MARGIN INK (KITS) ×3 IMPLANT
NDL HYPO 25X1 1.5 SAFETY (NEEDLE) ×1 IMPLANT
NEEDLE HYPO 25X1 1.5 SAFETY (NEEDLE) ×3 IMPLANT
NS IRRIG 1000ML POUR BTL (IV SOLUTION) IMPLANT
PACK BASIN DAY SURGERY FS (CUSTOM PROCEDURE TRAY) ×3 IMPLANT
PENCIL SMOKE EVACUATOR (MISCELLANEOUS) ×3 IMPLANT
RETRACTOR ONETRAX LX 90X20 (MISCELLANEOUS) IMPLANT
SLEEVE SCD COMPRESS KNEE MED (MISCELLANEOUS) ×3 IMPLANT
SPONGE LAP 4X18 RFD (DISPOSABLE) ×3 IMPLANT
STRIP CLOSURE SKIN 1/2X4 (GAUZE/BANDAGES/DRESSINGS) ×2 IMPLANT
SUT MNCRL AB 4-0 PS2 18 (SUTURE) IMPLANT
SUT MON AB 5-0 PS2 18 (SUTURE) ×2 IMPLANT
SUT SILK 2 0 SH (SUTURE) IMPLANT
SUT VIC AB 2-0 SH 27 (SUTURE) ×2
SUT VIC AB 2-0 SH 27XBRD (SUTURE) ×1 IMPLANT
SUT VIC AB 3-0 SH 27 (SUTURE) ×2
SUT VIC AB 3-0 SH 27X BRD (SUTURE) ×1 IMPLANT
SYR CONTROL 10ML LL (SYRINGE) ×3 IMPLANT
TOWEL GREEN STERILE FF (TOWEL DISPOSABLE) ×3 IMPLANT
TRAY FAXITRON CT DISP (TRAY / TRAY PROCEDURE) ×3 IMPLANT
TUBE CONNECTING 20'X1/4 (TUBING)
TUBE CONNECTING 20X1/4 (TUBING) IMPLANT
YANKAUER SUCT BULB TIP NO VENT (SUCTIONS) IMPLANT

## 2019-07-17 NOTE — Transfer of Care (Signed)
Immediate Anesthesia Transfer of Care Note  Patient: Mariah Haley  Procedure(s) Performed: RADIOACTIVE SEED GUIDED EXCISIONAL LEFT BREAST BIOPSY (Left Breast)  Patient Location: PACU  Anesthesia Type:General  Level of Consciousness: awake, alert  and oriented  Airway & Oxygen Therapy: Patient Spontanous Breathing and Patient connected to face mask oxygen  Post-op Assessment: Report given to RN and Post -op Vital signs reviewed and stable  Post vital signs: Reviewed and stable  Last Vitals:  Vitals Value Taken Time  BP 112/54 07/17/19 1043  Temp    Pulse 107 07/17/19 1044  Resp 14 07/17/19 1044  SpO2 98 % 07/17/19 1044  Vitals shown include unvalidated device data.  Last Pain:  Vitals:   07/17/19 0900  TempSrc: Tympanic  PainSc: 0-No pain      Patients Stated Pain Goal: 4 ([id number])  Complications: No apparent anesthesia complications

## 2019-07-17 NOTE — Anesthesia Procedure Notes (Signed)
Procedure Name: LMA Insertion Date/Time: 07/17/2019 10:05 AM Performed by: Genelle Bal, CRNA Pre-anesthesia Checklist: Patient identified, Emergency Drugs available, Suction available and Patient being monitored Patient Re-evaluated:Patient Re-evaluated prior to induction Oxygen Delivery Method: Circle system utilized Preoxygenation: Pre-oxygenation with 100% oxygen Induction Type: IV induction Ventilation: Mask ventilation without difficulty LMA: LMA inserted LMA Size: 4.0 Number of attempts: 1 Airway Equipment and Method: Bite block Placement Confirmation: positive ETCO2 Tube secured with: Tape Dental Injury: Teeth and Oropharynx as per pre-operative assessment

## 2019-07-17 NOTE — Discharge Instructions (Signed)
[facility] Office Phone Number [phone]  BREAST BIOPSY/ PARTIAL MASTECTOMY: POST OP INSTRUCTIONS Take 400 mg of ibuprofen every 8 hours or 650 mg tylenol every 6 hours for next 72 hours then as needed. Use ice several times daily also. Always review your discharge instruction sheet given to you by the facility where your surgery was performed.  IF YOU HAVE DISABILITY OR FAMILY LEAVE FORMS, YOU MUST BRING THEM TO THE OFFICE FOR PROCESSING.  DO NOT GIVE THEM TO YOUR DOCTOR.  1. A prescription for pain medication may be given to you upon discharge.  Take your pain medication as prescribed, if needed.  If narcotic pain medicine is not needed, then you may take acetaminophen (Tylenol), naprosyn (Alleve) or ibuprofen (Advil) as needed. *YOU HAD 1000 MG OF TYLENOL AT 9:05 AM 2. Take your usually prescribed medications unless otherwise directed 3. If you need a refill on your pain medication, please contact your pharmacy.  They will contact our office to request authorization.  Prescriptions will not be filled after 5pm or on week-ends. 4. You should eat very light the first 24 hours after surgery, such as soup, crackers, pudding, etc.  Resume your normal diet the day after surgery. 5. Most patients will experience some swelling and bruising in the breast.  Ice packs and a good support bra will help.  Wear the breast binder provided or a sports bra for 72 hours day and night.  After that wear a sports bra during the day until you return to the office. Swelling and bruising can take several days to resolve.  6. It is common to experience some constipation if taking pain medication after surgery.  Increasing fluid intake and taking a stool softener will usually help or prevent this problem from occurring.  A mild laxative (Milk of Magnesia or Miralax) should be taken according to package directions if there are no bowel movements after 48 hours. 7. Unless discharge instructions indicate  otherwise, you may remove your bandages 48 hours after surgery and you may shower at that time.  You may have steri-strips (small skin tapes) in place directly over the incision.  These strips should be left on the skin for 7-10 days and will come off on their own.  If your surgeon used skin glue on the incision, you may shower in 24 hours.  The glue will flake off over the next 2-3 weeks.  Any sutures or staples will be removed at the office during your follow-up visit. 8. ACTIVITIES:  You may resume regular daily activities (gradually increasing) beginning the next day.  Wearing a good support bra or sports bra minimizes pain and swelling.  You may have sexual intercourse when it is comfortable. a. You may drive when you no longer are taking prescription pain medication, you can comfortably wear a seatbelt, and you can safely maneuver your car and apply brakes. b. RETURN TO WORK:  ______________________________________________________________________________________ 9. You should see your doctor in the office for a follow-up appointment approximately two weeks after your surgery.  Your doctor's nurse will typically make your follow-up appointment when she calls you with your pathology report.  Expect your pathology report 3-4 business days after your surgery.  You may call to check if you do not hear from [place] after three days. 10. OTHER INSTRUCTIONS: _______________________________________________________________________________________________ _____________________________________________________________________________________________________________________________________ _____________________________________________________________________________________________________________________________________ _____________________________________________________________________________________________________________________________________  WHEN TO CALL DR WAKEFIELD: 1. Fever over 101.0 2. Nausea and/or  vomiting. 3. Extreme swelling or bruising. 4. Continued bleeding from incision. 5. Increased pain,  redness, or drainage from the incision.  The clinic staff is available to answer your questions during regular business hours.  Please don't hesitate to call and ask to speak to one of the nurses for clinical concerns.  If you have a medical emergency, go to the nearest emergency room or call 911.  A surgeon from [facility] is always on call at the hospital.  For further questions, please visit [email or web address]   Post Anesthesia Home Care Instructions  Activity: Get plenty of rest for the remainder of the day. A responsible individual must stay with you for 24 hours following the procedure.  For the next 24 hours, DO NOT: -Drive a car -Paediatric nurse -Drink alcoholic beverages -Take any medication unless instructed by your physician -Make any legal decisions or sign important papers.  Meals: Start with liquid foods such as gelatin or soup. Progress to regular foods as tolerated. Avoid greasy, spicy, heavy foods. If nausea and/or vomiting occur, drink only clear liquids until the nausea and/or vomiting subsides. Call your physician if vomiting continues.  Special Instructions/Symptoms: Your throat may feel dry or sore from the anesthesia or the breathing tube placed in your throat during surgery. If this causes discomfort, gargle with warm salt water. The discomfort should disappear within 24 hours.  If you had a scopolamine patch placed behind your ear for the management of post- operative nausea and/or vomiting:  1. The medication in the patch is effective for 72 hours, after which it should be removed.  Wrap patch in a tissue and discard in the trash. Wash hands thoroughly with soap and water. 2. You may remove the patch earlier than 72 hours if you experience unpleasant side effects which may include dry mouth, dizziness or visual disturbances. 3. Avoid  touching the patch. Wash your hands with soap and water after contact with the patch.

## 2019-07-17 NOTE — H&P (Signed)
Mariah Haley is an 59 y.o. female.   Chief Complaint: breast mass HPI:  72 yof I saw previously for left breast alh she has no mass or dc on self exam. she has no fh or personal history of breast disease. she was noted on screening mm to have c density breasts. she had a left breast distortion noted. the right is negative. additional views showed an asymmetry in the medial left breast with a few associated microcalcs. [place] is negative and shows c dense tissue. biopsy was done and clip apparently does not reside at the intended target. core result shows alh with pash. this is concordant. I discussed with radiology at the time and they felt the appropriate area was biopsied and that we could follow her. I agreed in accordance with ASBS and our local guidelines for [facility]. she then returned to get six month follow up and the next radiologist thought the area needed to be sampled again. she still has the asymmetry. the clip was 2 cm medial and we were aware of that. due to this and her concern for additional biopsies we elected to excise this area. I did a seed guided excision of this area on 8/20. path was lcis. she did well. she followed up with oncology and is on tamoxifen. she then underwent a mm in 11/20 that showed interval development of a right breast mass that is irregular. there are also bilateral cysts. she was recommended mri and had this done. the right is negative. no lad. there are postsurgical changes on the left and there are two indeterminate masses in upper inner left breast and the lower central. mr biopsy recommended for both. the upper inner was biopsied and is alh. the other one was not there and not biopsied. this was recommended for six month fu mri. she was referred again to discuss options   History reviewed. No pertinent past medical history.  Past Surgical History:  Procedure Laterality Date  . BREAST LUMPECTOMY Left 01/17/2019   ALH & PASH ([facility])  .  DILATION AND CURETTAGE OF UTERUS    . EYE SURGERY     "as a child"  . RADIOACTIVE SEED GUIDED EXCISIONAL BREAST BIOPSY Left 01/17/2019   Procedure: RADIOACTIVE SEED GUIDED LEFT BREAST LUMPECTOMY;  Surgeon: Rolm Bookbinder, MD;  Location: [facility];  Service: General;  Laterality: Left;    History reviewed. No pertinent family history. Social History:  reports that she has never smoked. She has never used smokeless tobacco. She reports current alcohol use. She reports that she does not use drugs.  Allergies: No Known Allergies  Medications Prior to Admission  Medication Sig Dispense Refill  . tamoxifen (NOLVADEX) 20 MG tablet Take 20 mg by mouth daily.    Marland Kitchen LIVALO 2 MG TABS     . LORazepam (ATIVAN) 0.5 MG tablet Take 1 tablet (0.5 mg total) by mouth once as needed for up to 2 doses for anxiety (take 60 minutes before biopsy appointment, may repeat x 1 just prior to biopsy. Do not drive after taking). 2 tablet 0  . valACYclovir (VALTREX) 500 MG tablet       No results found for this or any previous visit (from the past 48 hour(s)). MM LT RADIOACTIVE SEED LOC MAMMO GUIDE  Result Date: 07/16/2019 CLINICAL DATA:  Patient presents for radioactive seed localization area of lobular neoplasia in the left breast biopsied under MRI guidance on 05/10/2019. EXAM: MAMMOGRAPHIC GUIDED RADIOACTIVE SEED LOCALIZATION OF THE LEFT BREAST  COMPARISON:  Previous exam(s). FINDINGS: Patient presents for radioactive seed localization prior to surgical excision. I met with the patient and we discussed the procedure of seed localization including benefits and alternatives. We discussed the high likelihood of a successful procedure. We discussed the risks of the procedure including infection, bleeding, tissue injury and further surgery. We discussed the low dose of radioactivity involved in the procedure. Informed, written consent was given. The usual time-out protocol was performed immediately prior to  the procedure. Using mammographic guidance, sterile technique, 1% lidocaine and an I-125 radioactive seed, the cylinder shaped biopsy clip in the upper left breast was localized using a superior approach. The follow-up mammogram images confirm the seed in the expected location and were marked for Dr. Donne Hazel. Follow-up survey of the patient confirms presence of the radioactive seed. Order number of I-125 seed:  [id number]. Total activity:  1.164 millicuries reference Date: 07/05/2019 The patient tolerated the procedure well and was released from the [facility]. She was given instructions regarding seed removal. IMPRESSION: Radioactive seed localization of the left breast. No apparent complications. Electronically Signed   By: Lajean Manes M.D.   On: 07/16/2019 13:34    Review of Systems  All other systems reviewed and are negative.   Blood pressure (!) 146/86, pulse (!) 110, temperature 98.9 F (37.2 C), temperature source Tympanic, height 5' (1.524 m), weight 87 kg, last menstrual period 02/10/2011, SpO2 98 %. Physical Exam  General Mental Status-Alert. Orientation-Oriented X3. Breast Nipples-No Discharge. Breast Lump-No Palpable Breast Mass. Lymphatic Head & Neck General Head & Neck Lymphatics: Bilateral - Description - Normal. Axillary General Axillary Region: Bilateral - Description - Normal. 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 if they think needs excision we discussed another seed guided excisional biopsy. if they are comfortable with follow up I am fine with that also. I will discuss with radiology and then I will call her.  Rolm Bookbinder, MD 07/17/2019, 9:39 AM

## 2019-07-17 NOTE — Anesthesia Postprocedure Evaluation (Signed)
Anesthesia Post Note  Patient: Mariah Haley  Procedure(s) Performed: RADIOACTIVE SEED GUIDED EXCISIONAL LEFT BREAST BIOPSY (Left Breast)     Patient location during evaluation: PACU Anesthesia Type: General Level of consciousness: awake and alert Pain management: pain level controlled Vital Signs Assessment: post-procedure vital signs reviewed and stable Respiratory status: spontaneous breathing, nonlabored ventilation and respiratory function stable Cardiovascular status: blood pressure returned to baseline and stable Postop Assessment: no apparent nausea or vomiting Anesthetic complications: no    Last Vitals:  Vitals:   07/17/19 1115 07/17/19 1130  BP: 113/63 (!) 114/53  Pulse: 93 91  Resp: 11 16  Temp:  36.9 C  SpO2: 94% 98%    Last Pain:  Vitals:   07/17/19 1130  TempSrc:   PainSc: 3                  Catalina Gravel

## 2019-07-17 NOTE — Anesthesia Preprocedure Evaluation (Addendum)
Anesthesia Evaluation  Patient identified by MRN, date of birth, ID band Patient awake    Reviewed: Allergy & Precautions, NPO status , Patient's Chart, lab work & pertinent test results  History of Anesthesia Complications Negative for: history of anesthetic complications  Airway Mallampati: II  TM Distance: >3 FB Neck ROM: Full    Dental  (+) Teeth Intact, Dental Advisory Given, Caps   Pulmonary neg pulmonary ROS,    Pulmonary exam normal breath sounds clear to auscultation       Cardiovascular Exercise Tolerance: Good negative cardio ROS Normal cardiovascular exam Rhythm:Regular Rate:Normal     Neuro/Psych negative neurological ROS     GI/Hepatic negative GI ROS, Neg liver ROS,   Endo/Other  Obesity   Renal/GU negative Renal ROS     Musculoskeletal negative musculoskeletal ROS (+)   Abdominal   Peds  Hematology negative hematology ROS (+)   Anesthesia Other Findings Day of surgery medications reviewed with the patient.  LEFT BREAST MASS  Reproductive/Obstetrics                            Anesthesia Physical Anesthesia Plan  ASA: II  Anesthesia Plan: General   Post-op Pain Management:    Induction: Intravenous  PONV Risk Score and Plan: 3 and Midazolam, Diphenhydramine, Dexamethasone and Ondansetron  Airway Management Planned: LMA  Additional Equipment:   Intra-op Plan:   Post-operative Plan: Extubation in OR  Informed Consent: I have reviewed the patients History and Physical, chart, labs and discussed the procedure including the risks, benefits and alternatives for the proposed anesthesia with the patient or authorized representative who has indicated his/her understanding and acceptance.     Dental advisory given  Plan Discussed with: CRNA  Anesthesia Plan Comments:        Anesthesia Quick Evaluation

## 2019-07-17 NOTE — Interval H&P Note (Signed)
History and Physical Interval Note:  07/17/2019 9:41 AM  Mariah Haley  has presented today for surgery, with the diagnosis of LEFT BREAST MASS.  The various methods of treatment have been discussed with the patient and family. After consideration of risks, benefits and other options for treatment, the patient has consented to  Procedure(s): RADIOACTIVE SEED GUIDED EXCISIONAL LEFT BREAST BIOPSY (Left) as a surgical intervention.  The patient's history has been reviewed, patient examined, no change in status, stable for surgery.  I have reviewed the patient's chart and labs.  Questions were answered to the patient's satisfaction.     Rolm Bookbinder

## 2019-07-17 NOTE — Op Note (Addendum)
Preoperative diagnosis:left breast alh, high risk for breast cancer Postoperative diagnosis: Same as above Procedure: Left breast seed guided excisional biopsy Surgeon: Dr Serita Grammes Anesthesia: General estimated blood loss: minimal Specimens: left breast tissue marked with paint containing seed and clip Drains none Complications none Sponge needle count was correct at completion Disposition to recovery in stable condition  Indications:  24 yof I saw previously for left breast alh. she was noted on screening mm to have c density breasts. she had a left breast distortion noted. the right is negative. additional views showed an asymmetry in the medial left breast with a few associated microcalcs. [place] is negative and shows c dense tissue. biopsy was done and clip apparently does not reside at the intended target. core result shows alh with pash. this is concordant. I discussed with radiology at the time and they felt the appropriate area was biopsied and that we could follow her. I agreed in accordance with ASBS and our local guidelines for [facility]. she then returned to get six month follow up and the next radiologist thought the area needed to be sampled again. she still has the asymmetry. the clip was 2 cm medial and we were aware of that. due to this and her concern for additional biopsies we elected to excise this area. I did a seed guided excision of this area on 8/20. path was lcis. she did well. she followed up with oncology and is on tamoxifen. she then underwent a mm in 11/20 that showed interval development of a right breast mass that is irregular. there are also bilateral cysts. she was recommended mri and had this done. the right is negative. no lad. there are postsurgical changes on the left and there are two indeterminate masses in upper inner left breast and the lower central. mr biopsy recommended for both. the upper inner was biopsied and is alh. the other one was not  there and not biopsied. this was recommended for six month fu mri.I discussed with radiology and they would feel more comfortable with excision.  She had seed placed prior to beginning.    Procedure: After informed consent was obtained the patient was first given antibiotics.  SCDs were placed.  She was placed under general anesthesia without complication.  She was prepped and draped in the standard sterile surgical fashion.  Surgical timeout was then performed.  I had the mammograms available for my review in the operating room.  I confirmed the seed was present prior to beginning.  I used her prior periareolar incision to hide the scar.  I infiltrated this with Marcaine.  I then made an incision.  I then removed the seed and some of the surrounding tissue.  I then marked this with paint.  I passed this off the table and mammogram confirmed removal of the seed and the clip.  I then obtained hemostasis.  I closed the breast tissue with 2-0 Vicryl suture.  I then closed the skin with 3-0 Vicryl and 5-0 Monocryl.  Glue and Steri-Strips were applied.  She tolerated this well was extubated and transferred to recovery stable.

## 2019-07-18 ENCOUNTER — Encounter: Payer: Self-pay | Admitting: *Deleted

## 2019-07-19 LAB — SURGICAL PATHOLOGY

## 2019-11-26 NOTE — Progress Notes (Signed)
[place]  Telephone:([phone] Fax:(336) [phone]    ID: [name] DOB: 10-Apr-1961  MR#: [id number]  JQB#:[id number]  Patient Care Team: Fortino Sic, PA as PCP - General (Family Medicine) Shara Hartis, Virgie Dad, MD as Consulting Physician (Oncology) Rolm Bookbinder, MD as Consulting Physician (General Surgery) Hale Bogus., MD as Referring Physician (Gastroenterology) Jonathon Resides, MD as Consulting Physician (Family Medicine) OTHER MD:   CHIEF COMPLAINT: Lobular carcinoma in situ; breast cancer high risk  CURRENT TREATMENT: Tamoxifen; intensified screening 60   INTERVAL HISTORY: Mariah Haley is here for follow up of her LCIS and [place].  She continues on tamoxifen.  Her hot flashes are better.  She has some vaginal wetness.  Overall she is tolerating this well  Since her last visit, she underwent bilateral diagnostic mammography with tomography and bilateral breast ultrasonography at [facility] on 04/17/2019 showing: breast density category C; interval development of 1 cm irregular mass within far posterior right breast at 10 o'clock; bilateral fluctuating masses most compatible with cysts.  Breast MRI was recommended and performed on 04/18/2019 showing: breast composition C; two indeterminate 7 mm enhancing masses in upper-inner left breast and lower-central left breast; no evidence of malignancy on the right or suspicious lymphadenopathy.  She proceeded to biopsy of the right breast area seen on MRI on 04/26/2019. Pathology from the procedure ([id number]) showed fibrocystic change with calcifications.  Biopsy of the upper-inner left breast area was performed on 05/10/2019. Pathology [id number]) showed: lobular neoplasia (atypical lobular hyperplasia).  Accordingly, she proceeded to left lumpectomy on 07/17/2019 under Dr. Donne Hazel. Pathology from the procedure ([id number]) showed: atypical lobular hyperplasia; mild fibrocystic changes;  associated microcalcifications.   REVIEW OF SYSTEMS: Mariah Haley did well with all her procedures.  Of course her screening protocol went off schedule and we are going to be fixing that today.  She has exercising by doing water aerobics and is planning to get back into [employer].  She has some arthralgias which are not related to tamoxifen.  She was again advised to let us know if she ever develops any vaginal bleeding.  Detailed review of systems today was otherwise stable.   HISTORY OF CURRENT ILLNESS: "Mariah Haley" [name] had routine screening mammography on 03/07/2018 showing a possible abnormality in the left breast. She underwent unilateral left diagnostic mammography with tomography and left breast ultrasonography at [facility] on 03/14/2018 showing: Breast Density Category C. Additional mammographic views of the left breast demonstrate no reproducible distortion in the medial left breast, posterior depth. There is however an asymmetry with a few associated microcalcifications in the medial left breast, posterior depth, seen on both spot compression craniocaudal tomosynthesis views, image 41/69 and image 37/64. On physical exam, no suspicious masses are palpated. Targeted ultrasound is performed, showing dense breast parenchyma, but no sonographically identifiable mass. There is no evidence of left axillary lymphadenopathy.  Accordingly on 03/20/2018 she proceeded to biopsy of the left breast area in question. The pathology from this procedure showed [id number]):  Breast, left, needle core biopsy, medial - lobular neoplasia (atypical lobular hyperplasia). - fibrocystic changes with calcifications.   - pseudoangiomatous stromal hyperplasia (pash).  She underwent repeat unilateral left diagnostic mammography with tomography at [facility] on 11/10/2018 showing: Breast Density Category C. Indeterminate left breast asymmetry as originally identified on the patient's screening and  diagnostic workup. A biopsy clip at the site of patient's known atypical lobular hyperplasia is approximately 2 cm medial to this area.  On 11/20/2018 she  proceeded to biopsy of the left breast area in question. The pathology from this procedure showed ([id number]): Breast, left, needle core biopsy, uiq, posterior - lobular neoplasia (atypical lobular hyperplasia). - fibrocystic changes with calcifications.   - pseudoangiomatous stromal hyperplasia (pash).  Then, on 01/17/2019, she underwent a left lumpectomy. The pathology from this procedure showed ([id number]):  Breast, lumpectomy, Left w/seed   - lobular carcinoma in situ  The patient's subsequent history is as detailed below.   PAST MEDICAL HISTORY: Hypercholesterolemia, Gilbert's condition   PAST SURGICAL HISTORY: Past Surgical History:  Procedure Laterality Date  . BREAST LUMPECTOMY Left 60/26/2020   ALH & PASH (LCIS)  . DILATION AND CURETTAGE OF UTERUS    . EYE SURGERY     "as a child"  . RADIOACTIVE SEED GUIDED EXCISIONAL BREAST BIOPSY Left 01/17/2019   Procedure: RADIOACTIVE SEED GUIDED LEFT BREAST LUMPECTOMY;  Surgeon: Rolm Bookbinder, MD;  Location: [facility];  Service: General;  Laterality: Left;  . RADIOACTIVE SEED GUIDED EXCISIONAL BREAST BIOPSY Left 07/17/2019   Procedure: RADIOACTIVE SEED GUIDED EXCISIONAL LEFT BREAST BIOPSY;  Surgeon: Rolm Bookbinder, MD;  Location: [facility];  Service: General;  Laterality: Left;    FAMILY HISTORY: No family history on file. Kacy's father died at the age of 36 from heart problems (first heart attack age 81). Patients' mother is 20 years old as of September 2020. The patient has 1 brothers and 2 sisters. Patient denies anyone in her family having breast, ovarian, prostate, or pancreatic cancer.  There are some lung cancers in the family, all of them smokers   GYNECOLOGIC HISTORY:  Patient's last menstrual period was 02/10/2011. 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  Her husband Chalmer "Shanon Brow" is a former Arboriculturist, now retired.  Daughter Hulen Skains, 94, is a Licensed conveyancer at [employer] in [place]; daughter Melvern Banker is an Armed forces technical officer in [place]; Dr. Barnetta Chapel is getting it certified social work degree in [place].  No grandchildren.  The patient is a [employer]   ADVANCED DIRECTIVES: In the absence of documents to the contrary the patient's husband is her healthcare power of attorney   HEALTH MAINTENANCE: Social History   Tobacco Use  . Smoking status: Never Smoker  . Smokeless tobacco: Never Used  Substance Use Topics  . Alcohol use: Yes    Comment: rare  . Drug use: Never    Colonoscopy: 2019/[facility]  PAP: 2018/ [facility]  Bone density: no   No Known Allergies  Current Outpatient Medications  Medication Sig Dispense Refill  . tamoxifen (NOLVADEX) 20 MG tablet Take 20 mg by mouth daily.    . valACYclovir (VALTREX) 500 MG tablet      No current facility-administered medications for this visit.    OBJECTIVE: White woman who appears well  Vitals:   11/27/19 1014  BP: (!) 163/69  Pulse: (!) 105  Resp: 20  Temp: 99.1 F (37.3 C)  SpO2: 100%   Wt Readings from Last 3 Encounters:  11/27/19 195 lb (88.5 kg)  07/17/19 191 lb 12.8 oz (87 kg)  03/28/19 189 lb (85.7 kg)   Body mass index is 38.08 kg/m.    ECOG FS:1 - Symptomatic but completely ambulatory  Sclerae  unicteric, EOMs intact Wearing a mask No cervical or supraclavicular adenopathy Lungs no rales or rhonchi Heart regular rate and rhythm Abd soft, nontender, positive bowel sounds MSK no focal spinal tenderness, no upper extremity lymphedema Neuro: nonfocal, well oriented,  appropriate affect Breasts: No masses noted in either breast.  Both axillae are benign.   LAB RESULTS:  CMP  No results found for: NA, K, CL, CO2, GLUCOSE, BUN, CREATININE, CALCIUM, PROT, ALBUMIN, AST, ALT, ALKPHOS, BILITOT, GFRNONAA, GFRAA  No results found for: TOTALPROTELP, ALBUMINELP, A1GS, A2GS, BETS, BETA2SER, GAMS, MSPIKE, SPEI  No results found for: KPAFRELGTCHN, LAMBDASER, KAPLAMBRATIO  No results found for: WBC, NEUTROABS, HGB, HCT, MCV, PLT  No results found for: LABCA2  No components found for: ZOXWRU045  No results for input(s): INR in the last 168 hours.  No results found for: LABCA2  No results found for: WUJ811  No results found for: BJY782  No results found for: NFA213  No results found for: CA2729  No components found for: HGQUANT  No results found for: CEA1 / No results found for: CEA1   No results found for: AFPTUMOR  No results found for: CHROMOGRNA  No results found for: PSA1    (this displays the last labs from the last 3 days)  No results found for: TOTALPROTELP, ALBUMINELP, A1GS, A2GS, BETS, BETA2SER, GAMS, MSPIKE, SPEI (this displays SPEP labs)  No results found for: KPAFRELGTCHN, LAMBDASER, KAPLAMBRATIO (kappa/lambda light chains)  No results found for: HGBA, HGBA2QUANT, HGBFQUANT, HGBSQUAN (Hemoglobinopathy evaluation)   No results found for: LDH  No results found for: IRON, TIBC, IRONPCTSAT (Iron and TIBC)  No results found for: FERRITIN  Urinalysis No results found for: COLORURINE, APPEARANCEUR, LABSPEC, PHURINE, GLUCOSEU, HGBUR, BILIRUBINUR, KETONESUR, PROTEINUR, UROBILINOGEN, NITRITE, LEUKOCYTESUR   STUDIES:  Mammographic studies reviewed with the patient  ELIGIBLE FOR AVAILABLE RESEARCH PROTOCOL: no   ASSESSMENT: 59 y.o. [place], [place] woman status post left breast biopsy 03/20/2018 and 11/20/2018 showing atypical lobular hyperplasia  (1) status post left lumpectomy 01/17/2019 showing lobular carcinoma in  situ  (2) breast cancer high risk (30 to 50% lifetime risk)  (a) risk reduction: Tamoxifen started 02/22/2019  (b) intensified screening:   Breast MRI AUG   Breast Mammography FEB  (3) status post repeat left lumpectomy 07/17/2019 again showing atypical lobular hyperplasia   PLAN: Mariah Haley is now a little over half a year into her 5-year tamoxifen plan.  She is tolerating this well.  We reviewed her procedures and gave her a copy of her recent path.  Of course when you do close surveillance you do find things and she may indeed have more biopsies in her future.  She will have her next breast MRI late August and she will have her next mammogram in February 2022.  She will see me March 2022.  Of course we will be glad to see her before then if the need arises  I encouraged her to continue and intensify her exercise program  Total encounter time 20 minutes.Sarajane Jews C. Jackee Glasner, MD 11/27/19 10:24 AM Medical Oncology and Hematology [facility] [place], [place] [address] Tel. [phone]    Fax. [phone]    I, Wilburn Mylar, am acting as scribe for Dr. Virgie Dad. Coleton Woon.  I, Lurline Del MD, have reviewed the above documentation for accuracy and completeness, and I agree with the above.    *Total Encounter Time as defined by the Centers for Medicare and Medicaid Services includes, in addition to the  face-to-face time of a patient visit (documented in the note above) non-face-to-face time: obtaining and reviewing outside history, ordering and reviewing medications, tests or procedures, care coordination (communications with other health care professionals or caregivers) and documentation in the medical record.

## 2019-11-27 ENCOUNTER — Other Ambulatory Visit: Payer: Self-pay

## 2019-11-27 ENCOUNTER — Inpatient Hospital Stay: Payer: BC Managed Care – PPO | Attending: Oncology | Admitting: Oncology

## 2019-11-27 VITALS — BP 163/69 | HR 105 | Temp 99.1°F | Resp 20 | Ht 60.0 in | Wt 195.0 lb

## 2019-11-27 DIAGNOSIS — N6092 Unspecified benign mammary dysplasia of left breast: Secondary | ICD-10-CM | POA: Diagnosis not present

## 2019-11-27 DIAGNOSIS — Z170 Estrogen receptor positive status [ER+]: Secondary | ICD-10-CM | POA: Insufficient documentation

## 2019-11-27 DIAGNOSIS — R232 Flushing: Secondary | ICD-10-CM | POA: Insufficient documentation

## 2019-11-27 DIAGNOSIS — D0501 Lobular carcinoma in situ of right breast: Secondary | ICD-10-CM | POA: Insufficient documentation

## 2019-11-27 DIAGNOSIS — Z79810 Long term (current) use of selective estrogen receptor modulators (SERMs): Secondary | ICD-10-CM | POA: Insufficient documentation

## 2019-11-27 DIAGNOSIS — Z1239 Encounter for other screening for malignant neoplasm of breast: Secondary | ICD-10-CM

## 2019-11-27 DIAGNOSIS — E7800 Pure hypercholesterolemia, unspecified: Secondary | ICD-10-CM | POA: Diagnosis not present

## 2019-11-27 DIAGNOSIS — Z801 Family history of malignant neoplasm of trachea, bronchus and lung: Secondary | ICD-10-CM | POA: Insufficient documentation

## 2020-01-18 ENCOUNTER — Telehealth: Payer: Self-pay | Admitting: Adult Health

## 2020-01-18 ENCOUNTER — Other Ambulatory Visit: Payer: Self-pay | Admitting: Adult Health

## 2020-01-18 DIAGNOSIS — N6092 Unspecified benign mammary dysplasia of left breast: Secondary | ICD-10-CM

## 2020-01-18 DIAGNOSIS — Z1239 Encounter for other screening for malignant neoplasm of breast: Secondary | ICD-10-CM

## 2020-01-18 NOTE — Telephone Encounter (Signed)
Called patient to let her know that we need to move her MRI to November 25 or later.  Her insurance has denied her MRI and will only approve one MRI per year.    I was unable to communicate the above with her because I got her voice mail.  I asked that she call us back regarding her MRI.    Wilber Bihari, NP

## 2020-01-22 ENCOUNTER — Other Ambulatory Visit: Payer: BC Managed Care – PPO

## 2020-02-25 ENCOUNTER — Other Ambulatory Visit: Payer: Self-pay | Admitting: *Deleted

## 2020-02-27 ENCOUNTER — Ambulatory Visit (INDEPENDENT_AMBULATORY_CARE_PROVIDER_SITE_OTHER): Payer: BC Managed Care – PPO | Admitting: Podiatry

## 2020-02-27 ENCOUNTER — Other Ambulatory Visit: Payer: Self-pay

## 2020-02-27 ENCOUNTER — Encounter: Payer: Self-pay | Admitting: Podiatry

## 2020-02-27 ENCOUNTER — Ambulatory Visit (INDEPENDENT_AMBULATORY_CARE_PROVIDER_SITE_OTHER): Payer: BC Managed Care – PPO

## 2020-02-27 DIAGNOSIS — M21612 Bunion of left foot: Secondary | ICD-10-CM | POA: Diagnosis not present

## 2020-02-27 DIAGNOSIS — M21611 Bunion of right foot: Secondary | ICD-10-CM | POA: Diagnosis not present

## 2020-02-27 DIAGNOSIS — M216X9 Other acquired deformities of unspecified foot: Secondary | ICD-10-CM

## 2020-02-27 NOTE — Progress Notes (Signed)
Subjective:   Patient ID: Mariah Haley, female   DOB: 59 y.o.   MRN: [id number]   HPI Patient presents stating her bunion is getting worse on her right and she gets irritation between the big toe second toe and also is getting pain underneath the second metatarsal right that is been present for a while.  Patient states this has been going on a long time and she is tried wider shoes shoe gear modifications anti-inflammatories and soaks without relief and patient does not smoke and would like to be more active.  Left foot also has structural deformity not to the same degree his right   Review of Systems  All other systems reviewed and are negative.       Objective:  Physical Exam Vitals and nursing note reviewed.  Constitutional:      Appearance: She is well-developed.  Pulmonary:     Effort: Pulmonary effort is normal.  Musculoskeletal:        General: Normal range of motion.  Skin:    General: Skin is warm.  Neurological:     Mental Status: She is alert.     Neurovascular status intact muscle strength found to be adequate range of motion within normal limits.  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  At this point we did discuss her structural deformity and I do think an aggressive distal osteotomy would do well for her even though it may not get complete correction.  I also recommended shortening osteotomy of the second metatarsal and possible Akin osteotomy with pressure between the big toe second toe.  I reviewed all this  with her at great length and she wants surgery and is tentatively scheduled November and will be seen back to go over everything in detail.  I answered all questions and did review the recovery process as best we can  X-rays indicate significant elevation of the intermetatarsal angle right over left with deviation hallux right over left slight elongation second metatarsal with no indications of arthritis

## 2020-02-27 NOTE — Patient Instructions (Signed)
Bunion  A bunion is a bump on the base of the big toe that forms when the bones of the big toe joint move out of position. Bunions may be small at first, but they often get larger over time. They can make walking painful. What are the causes? A bunion may be caused by:  Wearing narrow or pointed shoes that force the big toe to press against the other toes.  Abnormal foot development that causes the foot to roll inward (pronate).  Changes in the foot that are caused by certain diseases, such as rheumatoid arthritis or polio.  A foot injury. What increases the risk? The following factors may make you more likely to develop this condition:  Wearing shoes that squeeze the toes together.  Having certain diseases, such as: ? Rheumatoid arthritis. ? Polio. ? Cerebral palsy.  Having family members who have bunions.  Being born with a foot deformity, such as flat feet or low arches.  Doing activities that put a lot of pressure on the feet, such as ballet dancing. What are the signs or symptoms? The main symptom of a bunion is a noticeable bump on the big toe. Other symptoms may include:  Pain.  Swelling around the big toe.  Redness and inflammation.  Thick or hardened skin on the big toe or between the toes.  Stiffness or loss of motion in the big toe.  Trouble with walking. How is this diagnosed? A bunion may be diagnosed based on your symptoms, medical history, and activities. You may have tests, such as:  X-rays. These allow your health care provider to check the position of the bones in your foot and look for damage to your joint. They also help your health care provider determine the severity of your bunion and the best way to treat it.  Joint aspiration. In this test, a sample of fluid is removed from the toe joint. This test may be done if you are in a lot of pain. It helps rule out diseases that cause painful swelling of the joints, such as arthritis. How is this  treated? Treatment depends on the severity of your symptoms. The goal of treatment is to relieve symptoms and prevent the bunion from getting worse. Your health care provider may recommend:  Wearing shoes that have a wide toe box.  Using bunion pads to cushion the affected area.  Taping your toes together to keep them in a normal position.  Placing a device inside your shoe (orthotics) to help reduce pressure on your toe joint.  Taking medicine to ease pain, inflammation, and swelling.  Applying heat or ice to the affected area.  Doing stretching exercises.  Surgery to remove scar tissue and move the toes back into their normal position. This treatment is rare. Follow these instructions at home: Managing pain, stiffness, and swelling   If directed, put ice on the painful area: ? Put ice in a plastic bag. ? Place a towel between your skin and the bag. ? Leave the ice on for 20 minutes, 2-3 times a day. Activity   If directed, apply heat to the affected area before you exercise. Use the heat source that your health care provider recommends, such as a moist heat pack or a heating pad. ? Place a towel between your skin and the heat source. ? Leave the heat on for 20-30 minutes. ? Remove the heat if your skin turns bright red. This is especially important if you are unable to feel pain,   heat, or cold. You may have a greater risk of getting burned.  Do exercises as told by your health care provider. General instructions  Support your toe joint with proper footwear, shoe padding, or taping as told by your health care provider.  Take over-the-counter and prescription medicines only as told by your health care provider.  Keep all follow-up visits as told by your health care provider. This is important. Contact a health care provider if your symptoms:  Get worse.  Do not improve in 2 weeks. Get help right away if you have:  Severe pain and trouble with walking. Summary  A  bunion is a bump on the base of the big toe that forms when the bones of the big toe joint move out of position.  Bunions can make walking painful.  Treatment depends on the severity of your symptoms.  Support your toe joint with proper footwear, shoe padding, or taping as told by your health care provider. This information is not intended to replace advice given to you by your health care provider. Make sure you discuss any questions you have with your health care provider. Document Revised: 11/14/2017 Document Reviewed: 09/20/2017 Elsevier Patient Education  2020 Elsevier Inc.  

## 2020-03-07 ENCOUNTER — Telehealth: Payer: Self-pay

## 2020-03-07 NOTE — Telephone Encounter (Signed)
Spoke with [facility] regarding pt's MRI. Pt has not yet had MRI because insurance denied it. Sarah at [facility] states she is going to get her insurance team involved to get pt's MRI done. They also understand pt will not need bilat MM before MRI and she is to have her MM 6 mos after MRI. Pt also understands this and knows to call us if they do not get in touch with her soon about her insurance approval.

## 2020-03-10 ENCOUNTER — Encounter (HOSPITAL_COMMUNITY): Payer: Self-pay

## 2020-03-11 ENCOUNTER — Telehealth: Payer: Self-pay | Admitting: Podiatry

## 2020-03-11 NOTE — Telephone Encounter (Signed)
Pt called having questions about if she needed a COVID test before surgery and if we were providing her with a boot. I spoke to [name] gave her a call to answer questions.

## 2020-03-14 ENCOUNTER — Telehealth: Payer: Self-pay

## 2020-03-14 NOTE — Telephone Encounter (Signed)
DOS 04/08/2020  [place] BUNIONECTOMY RT - 78412 METATARSAL OSTEOTOMY 2ND RT - 82081 POSS AIKEN OSTECTOMY RT - 38871  BCBS ST EFFECTIVE DATE - 05/25/2019  PLAN DEDUCTIBLE - $1500.00 W/ $0.00 REMAINING OUT OF POCKET - $5900.00 W/ $9597.47 REMAING COPAY $0.00 COINSURANCE - 70%  NO AUTH REQUIRED PER WEBSITE

## 2020-03-29 ENCOUNTER — Ambulatory Visit
Admission: RE | Admit: 2020-03-29 | Discharge: 2020-03-29 | Disposition: A | Discharge status: Home / Self Care | Payer: BC Managed Care – PPO | Source: Ambulatory Visit | Attending: Adult Health | Admitting: Adult Health

## 2020-03-29 ENCOUNTER — Other Ambulatory Visit: Payer: Self-pay

## 2020-03-29 DIAGNOSIS — N6092 Unspecified benign mammary dysplasia of left breast: Secondary | ICD-10-CM

## 2020-03-29 DIAGNOSIS — Z1239 Encounter for other screening for malignant neoplasm of breast: Secondary | ICD-10-CM

## 2020-03-29 IMAGING — MR MR BREAST BILAT WO/W CM
11 of 14 series · 31 of 48 positions shown · IV contrast (gadavist)
Comparison: Prior exams including the previous breast MRI dated
[DATE]

CLINICAL DATA: This patient has a history of previous breast
biopsies and 2 left lumpectomies. Patient a biopsy of the left
breast in [DATE] revealing ALH. Follow-up surgical excision was
performed in [DATE]. On the MRI from [DATE] she was found
to 2 enhancing left breast masses. She was also found have a right
breast mass, for which she underwent stereotactic core needle
biopsy, revealing concordant benign fibrocystic change. Follow-up
MRI guided biopsy of the left breast was performed. One of the two,
7 mm masses could not be reproduced at biopsy. The more anterior
mass was biopsied revealing atypical lobular hyperplasia. She
subsequently underwent repeat excision for this in [DATE].

LABS:  None drawn at time of imaging.
EXAM:
BILATERAL BREAST MRI WITH AND WITHOUT CONTRAST
TECHNIQUE: Multiplanar, multisequence MR images of both breasts were obtained
prior to and following the intravenous administration of 8 ml of
Gadavist

[Series 3: t2_tirm_tra ipat (a-p) · axial · 3.0mm · 0.70mm/px · 1 of 59 slices shown]
[im 1/59]
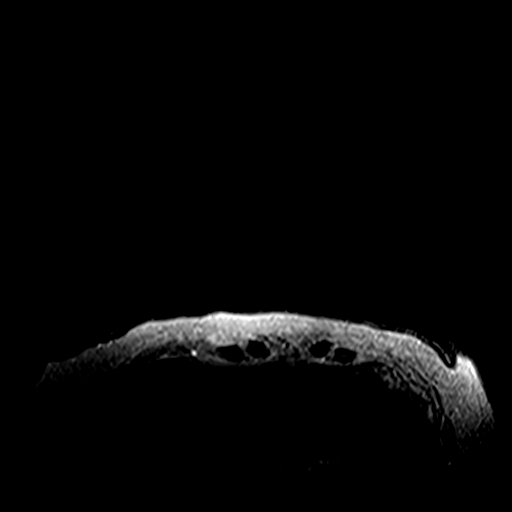

[Series 4: fl3d pre-cm no · axial · non-contrast · 1.2mm · 0.94mm/px · z∈[-43,+148]mm · 3 of 160 slices shown]
[im 1/160]
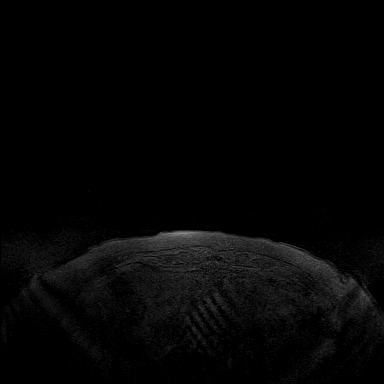
[im 80/160]
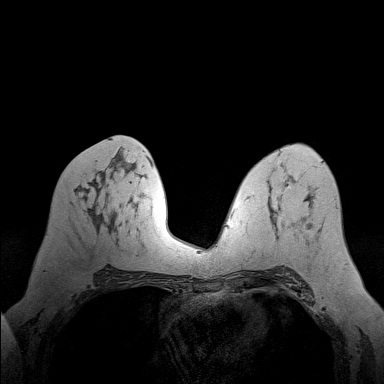
[im 160/160]
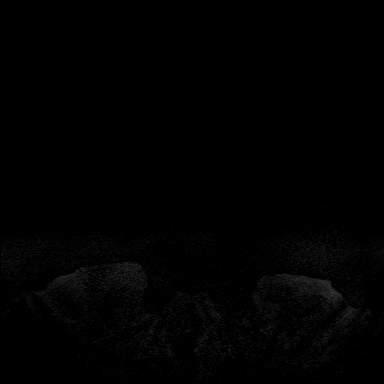

[Series 5: fl3d pre-cm · axial · non-contrast · 1.2mm · 0.94mm/px · z∈[-43,+148]mm · 3 of 160 slices shown]
[im 1/160]
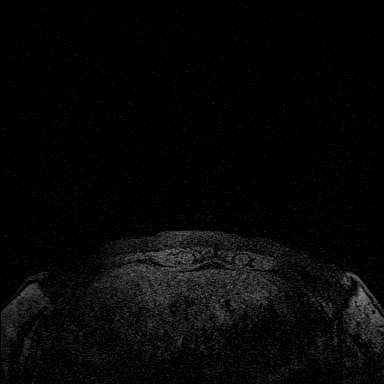
[im 80/160]
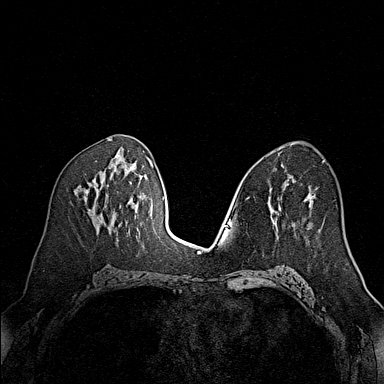
[im 160/160]
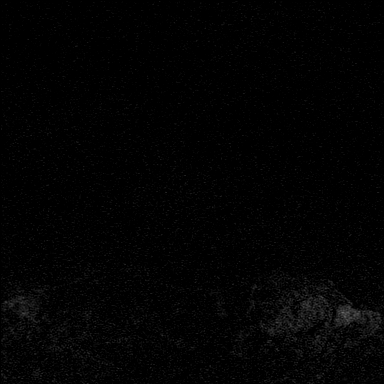

[Series 6: fl3d post-cm 20 · axial · 1.2mm · 0.94mm/px · z∈[-43,+148]mm · 4 of 160 slices shown (1 of 3)]
[im 1/160]
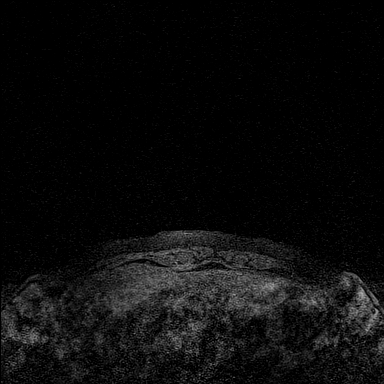
[im 54/160]
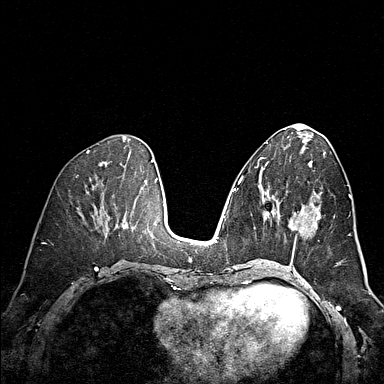
[im 107/160]
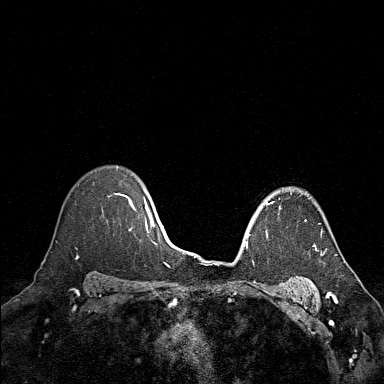
[im 160/160]
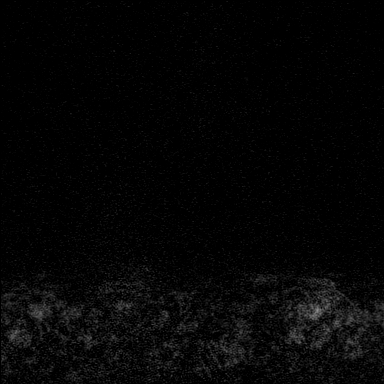

[Series 7: fl3d post-cm 20 · axial · 1.2mm · 0.94mm/px · z∈[-43,+148]mm · 4 of 160 slices shown (2 of 3)]
[im 1/160]
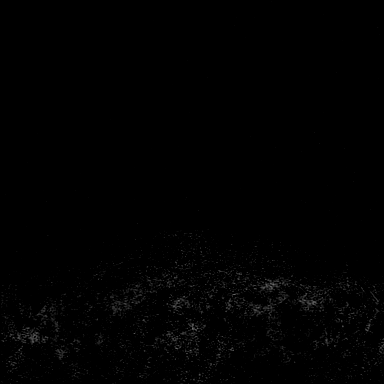
[im 54/160]
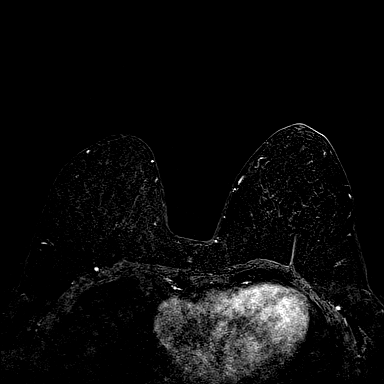
[im 107/160]
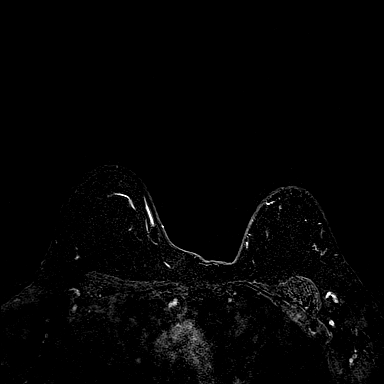
[im 160/160]
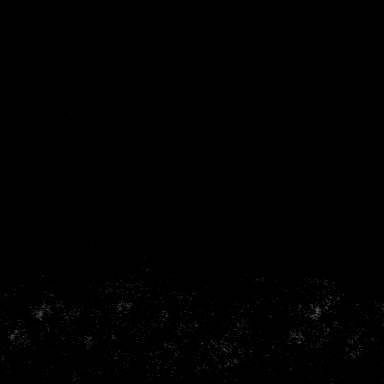

[Series 8: fl3d post-cm 20 · axial · 192.0mm · 0.94mm/px · 1 of 1 slices shown (3 of 3)]
[im 1/1]
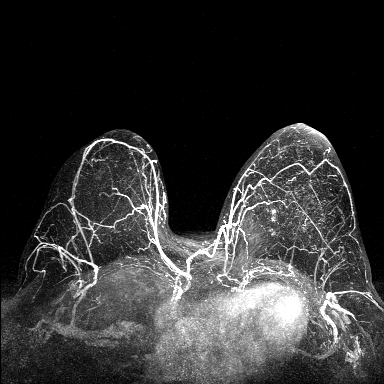

[Series 9: fl3d post-cm 3min · axial · 1.2mm · 0.94mm/px · z∈[-43,+148]mm · 4 of 160 slices shown]
[im 1/160]
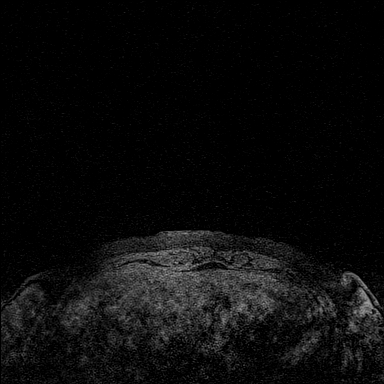
[im 54/160]
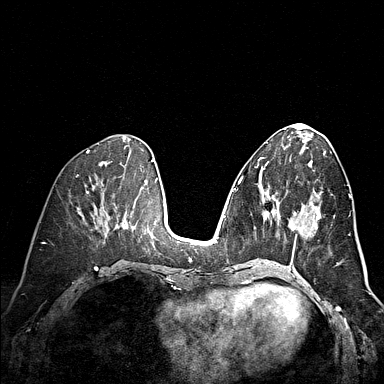
[im 107/160]
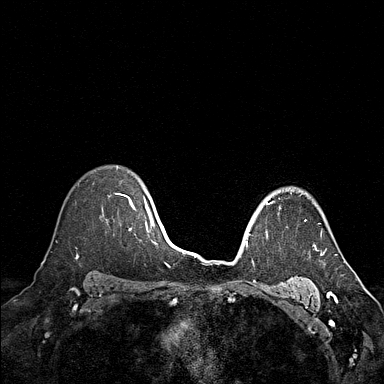
[im 160/160]
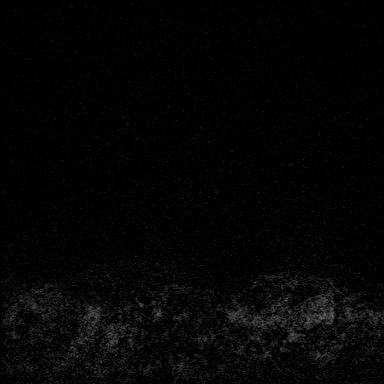

[Series 10: fl3d post-cm 3min_sub · axial · 1.2mm · 0.94mm/px · z∈[-43,+148]mm · 4 of 160 slices shown]
[im 1/160]
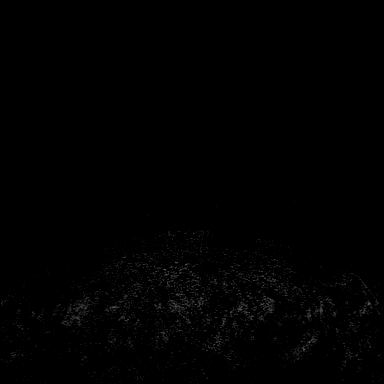
[im 54/160]
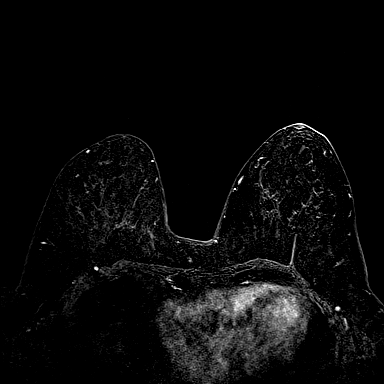
[im 107/160]
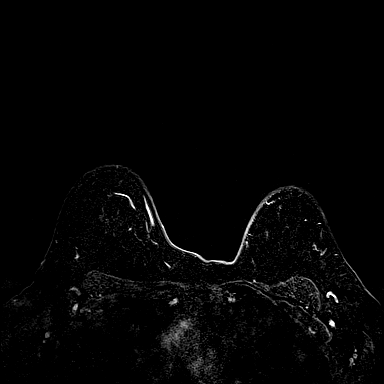
[im 160/160]
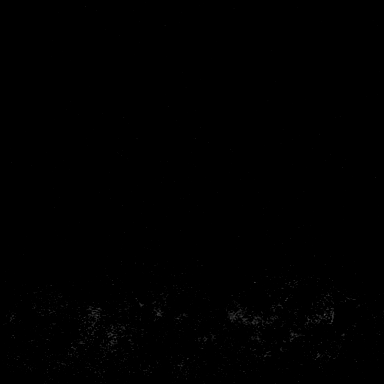

[Series 11: fl3d post-cm 3min_sub_mip_tra · axial · 192.0mm · 0.94mm/px · 1 of 1 slices shown]
[im 1/1]
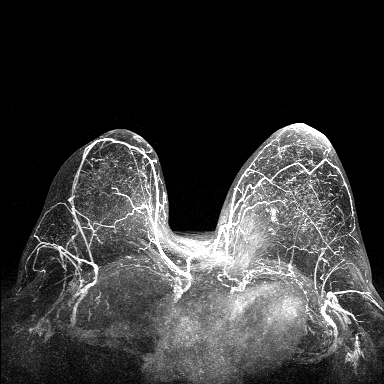

[Series 12: fl3d post-cm 5min · axial · 1.2mm · 0.94mm/px · z∈[-43,+148]mm · 4 of 160 slices shown]
[im 1/160]
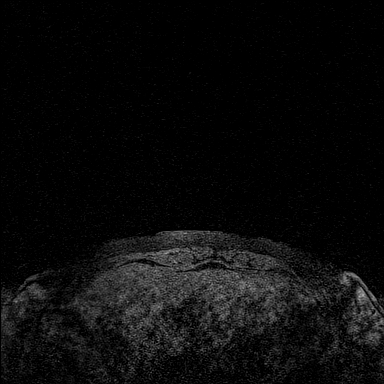
[im 54/160]
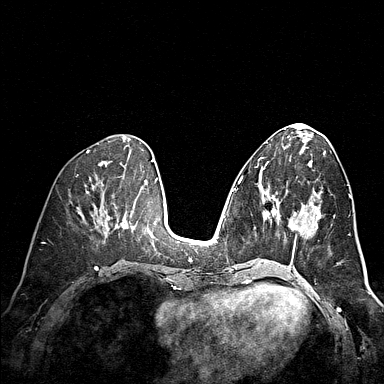
[im 107/160]
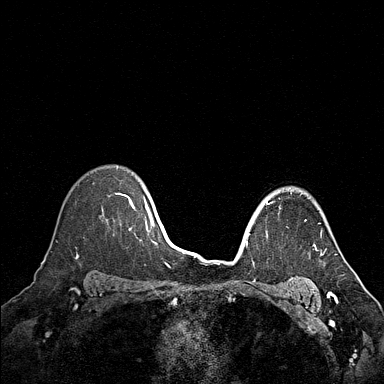
[im 160/160]
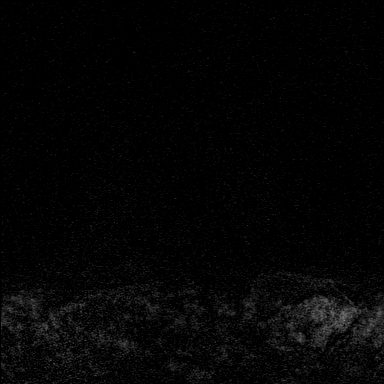

[Series 13: fl3d post-cm 5min_sub · axial · 1.2mm · 0.94mm/px · z∈[-43,+21]mm · 2 of 160 slices shown]
[im 1/160]
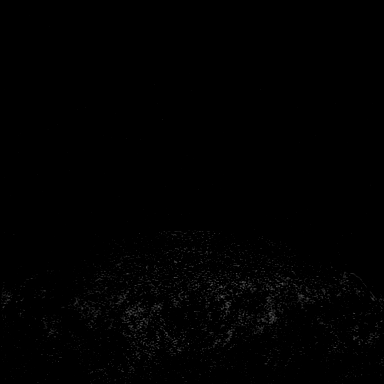
[im 54/160]
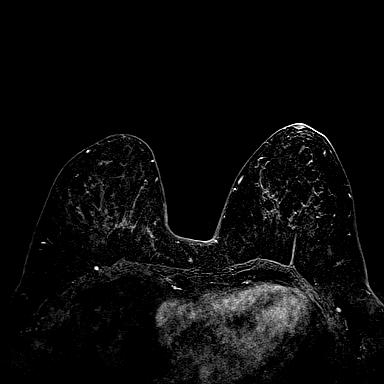

[31 of 48 positions shown; findings below may reference images not displayed]

Three-dimensional MR images were rendered by post-processing of the
original MR data on an independent workstation. The
three-dimensional MR images were interpreted, and findings are
reported in the following complete MRI report for this study. Three
dimensional images were evaluated at the independent interpreting
workstation using the DynaCAD thin client.
FINDINGS: Breast composition: b. Scattered fibroglandular tissue.

Background parenchymal enhancement: Minimal

Right breast: No mass or abnormal enhancement.

Left breast: 6 mm enhancing mass in the posterior, upper inner left
breast, posterior to the lumpectomy changes. This demonstrates
partial washout kinetics. The mass is centered on image 77, series
7. There is enhancement anterior to this along the lumpectomy bed
adjacent to susceptibility artifact consistent with post lumpectomy
change/fat necrosis enhancement.

Mild non mass enhancement is seen along the anteromedial left breast
consistent with post lumpectomy change from the more recent
lumpectomy.

No other masses or areas of abnormal enhancement.

Lymph nodes: No abnormal appearing lymph nodes.

Ancillary findings:  None.
IMPRESSION: 1. Indeterminate, enhancing 6 mm mass posterior to the previous
lumpectomy bed in the left breast, upper inner quadrant, posterior
depth. Tissue sampling is recommended.
2. No other suspicious findings. Two areas of benign post lumpectomy
change on the left. No evidence of right breast malignancy.

RECOMMENDATION:
1. MRI guided biopsy of the 6 mm enhancing mass in the posterior,
upper inner left breast.

BI-RADS CATEGORY  4: Suspicious.

## 2020-03-29 MED ORDER — GADOBUTROL 1 MMOL/ML IV SOLN
8.0000 mL | Freq: Once | INTRAVENOUS | Status: AC | PRN
  Administered 2020-03-29: 8 mL via INTRAVENOUS

## 2020-04-02 ENCOUNTER — Other Ambulatory Visit: Payer: Self-pay | Admitting: Oncology

## 2020-04-03 ENCOUNTER — Other Ambulatory Visit: Payer: Self-pay | Admitting: Adult Health

## 2020-04-03 DIAGNOSIS — Z1239 Encounter for other screening for malignant neoplasm of breast: Secondary | ICD-10-CM

## 2020-04-03 DIAGNOSIS — N6092 Unspecified benign mammary dysplasia of left breast: Secondary | ICD-10-CM

## 2020-04-03 NOTE — Progress Notes (Signed)
Called patient about results and MRI biopsy order.  She is undergoing foot surgery next week and will need to postpone the biopsy.  She can have this in about 3 weeks.  I will send a message to the breast center navigators to help with coordinating this.  I let Ollen Gross know I didn't think it would be a problem for them to position her foot in a way that was comfortable on pillows.  Wilber Bihari, NP

## 2020-04-04 ENCOUNTER — Encounter: Payer: Self-pay | Admitting: Podiatry

## 2020-04-04 ENCOUNTER — Ambulatory Visit (INDEPENDENT_AMBULATORY_CARE_PROVIDER_SITE_OTHER): Payer: BC Managed Care – PPO | Admitting: Podiatry

## 2020-04-04 ENCOUNTER — Other Ambulatory Visit: Payer: Self-pay

## 2020-04-04 DIAGNOSIS — M21611 Bunion of right foot: Secondary | ICD-10-CM | POA: Diagnosis not present

## 2020-04-04 DIAGNOSIS — M21612 Bunion of left foot: Secondary | ICD-10-CM

## 2020-04-04 DIAGNOSIS — M216X9 Other acquired deformities of unspecified foot: Secondary | ICD-10-CM | POA: Diagnosis not present

## 2020-04-05 NOTE — Progress Notes (Signed)
Subjective:   Patient ID: Mariah Haley, female   DOB: 59 y.o.   MRN: [id number]   HPI Patient presents stating I am ready to get this right foot fixed and is here to review the consent form.  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  At this point I have recommended surgical intervention and I allowed her to read consent form going over alternative treatments complications with surgery.  Patient wants surgery understands what we would do and is willing to accept the risk of procedure.  Patient after extensive review signed consent form is given all preoperative and operating room instructions.  I then went ahead dispensed air fracture walker that I want her to get used to prior to surgery and find a shoe on the other foot which allows her to walk comfortably.  I explained recovery from beginning to end and the fact that complete total recovery can take 6 months to 1 year.  Patient is scheduled for distal osteotomy possible Akin osteotomy and shortening osteotomy and will be seen back for surgical intervention next week

## 2020-04-07 MED ORDER — ONDANSETRON HCL 4 MG PO TABS: 4.0000 mg | ORAL_TABLET | Freq: Three times a day (TID) | ORAL | 0 refills | Status: DC | PRN

## 2020-04-07 MED ORDER — OXYCODONE-ACETAMINOPHEN 10-325 MG PO TABS
1.0000 | ORAL_TABLET | ORAL | 0 refills | Status: DC | PRN
Start: 2020-04-07 — End: 2020-08-05

## 2020-04-07 NOTE — Addendum Note (Signed)
Addended by: Wallene Huh on: 04/07/2020 12:30 PM   Modules accepted: Orders

## 2020-04-08 ENCOUNTER — Encounter: Payer: Self-pay | Admitting: Podiatry

## 2020-04-08 DIAGNOSIS — M21541 Acquired clubfoot, right foot: Secondary | ICD-10-CM | POA: Diagnosis not present

## 2020-04-08 DIAGNOSIS — M2011 Hallux valgus (acquired), right foot: Secondary | ICD-10-CM

## 2020-04-14 ENCOUNTER — Ambulatory Visit (INDEPENDENT_AMBULATORY_CARE_PROVIDER_SITE_OTHER): Payer: BC Managed Care – PPO | Admitting: Podiatry

## 2020-04-14 ENCOUNTER — Ambulatory Visit (INDEPENDENT_AMBULATORY_CARE_PROVIDER_SITE_OTHER): Payer: BC Managed Care – PPO

## 2020-04-14 ENCOUNTER — Encounter: Payer: Self-pay | Admitting: Podiatry

## 2020-04-14 ENCOUNTER — Other Ambulatory Visit: Payer: Self-pay

## 2020-04-14 DIAGNOSIS — M21611 Bunion of right foot: Secondary | ICD-10-CM

## 2020-04-14 DIAGNOSIS — M21619 Bunion of unspecified foot: Secondary | ICD-10-CM | POA: Diagnosis not present

## 2020-04-18 NOTE — Progress Notes (Signed)
Subjective:   Patient ID: Mariah Haley, female   DOB: 59 y.o.   MRN: [id number]   HPI Patient states doing really well with surgery and pleased with results and able to bear weight on her foot   ROS      Objective:  Physical Exam  Neurovascular status intact negative Bevelyn Buckles' sign noted wound edges first metatarsal second metatarsal healing well with no drainage or gapping noted with good alignment of the first MPJ good range of motion     Assessment:  Doing well post osteotomy first second metatarsal right     Plan:  H&P x-rays reviewed and sterile dressing reapplied.  Instructed on continued immobilization elevation compression and will be seen back to recheck  X-rays indicate that there is good alignment noted fixation in place with good reduction of the intermetatarsal angle

## 2020-04-22 ENCOUNTER — Encounter: Payer: Self-pay | Admitting: Oncology

## 2020-04-23 ENCOUNTER — Other Ambulatory Visit: Payer: Self-pay | Admitting: Adult Health

## 2020-04-23 DIAGNOSIS — Z1239 Encounter for other screening for malignant neoplasm of breast: Secondary | ICD-10-CM

## 2020-04-23 DIAGNOSIS — N6092 Unspecified benign mammary dysplasia of left breast: Secondary | ICD-10-CM

## 2020-04-23 HISTORY — PX: BREAST BIOPSY: SHX20

## 2020-05-05 ENCOUNTER — Encounter: Payer: Self-pay | Admitting: Podiatry

## 2020-05-05 ENCOUNTER — Ambulatory Visit (INDEPENDENT_AMBULATORY_CARE_PROVIDER_SITE_OTHER): Payer: BC Managed Care – PPO

## 2020-05-05 ENCOUNTER — Other Ambulatory Visit: Payer: Self-pay

## 2020-05-05 ENCOUNTER — Ambulatory Visit (INDEPENDENT_AMBULATORY_CARE_PROVIDER_SITE_OTHER): Payer: BC Managed Care – PPO | Admitting: Podiatry

## 2020-05-05 DIAGNOSIS — M21619 Bunion of unspecified foot: Secondary | ICD-10-CM

## 2020-05-05 DIAGNOSIS — M21611 Bunion of right foot: Secondary | ICD-10-CM

## 2020-05-06 ENCOUNTER — Ambulatory Visit
Admission: RE | Admit: 2020-05-06 | Discharge: 2020-05-06 | Disposition: A | Discharge status: Home / Self Care | Payer: BC Managed Care – PPO | Source: Ambulatory Visit | Attending: Adult Health | Admitting: Adult Health

## 2020-05-06 ENCOUNTER — Other Ambulatory Visit (HOSPITAL_COMMUNITY): Payer: Self-pay | Admitting: Diagnostic Radiology

## 2020-05-06 DIAGNOSIS — Z1239 Encounter for other screening for malignant neoplasm of breast: Secondary | ICD-10-CM

## 2020-05-06 DIAGNOSIS — N6092 Unspecified benign mammary dysplasia of left breast: Secondary | ICD-10-CM

## 2020-05-06 IMAGING — MR MR BREAST BX W LOC DEV 1ST LESION IMAGE BX SPEC MR GUIDE*L*
7 of 10 series · 31 of 48 positions shown · IV contrast (8ml Gadavist)
Comparison: Previous exams.
COMPARISON: Previous exams.

Addendum:
CLINICAL DATA: 59-year-old with an indeterminate enhancing 6 mm
mass involving the UPPER INNER QUADRANT of the LEFT breast.
TECHNIQUE: Multiplanar, multisequence MR imaging of the LEFT breast was
performed both before and after administration of intravenous
contrast.

CONTRAST:  8mL GADAVIST GADOBUTROL 1 MMOL/ML IV.

[Series 2: fiducial unilateral · sagittal · 2.0mm · 1.33mm/px · 3 of 52 slices shown]
[im 1/52]
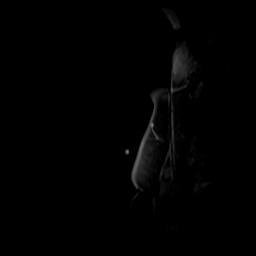
[im 26/52]
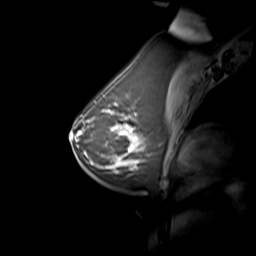
[im 52/52]
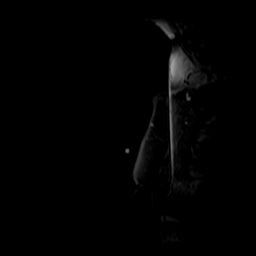

[Series 3: dynamic pre · axial · non-contrast · 1.3mm · 0.73mm/px · z∈[-81,+105]mm · 5 of 144 slices shown]
[im 1/144]
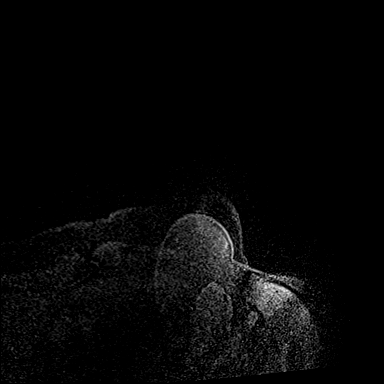
[im 36/144]
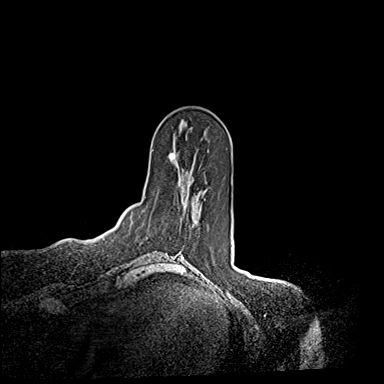
[im 72/144]
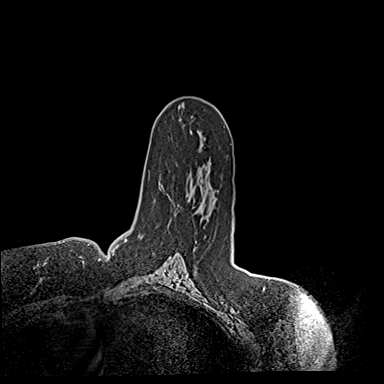
[im 108/144]
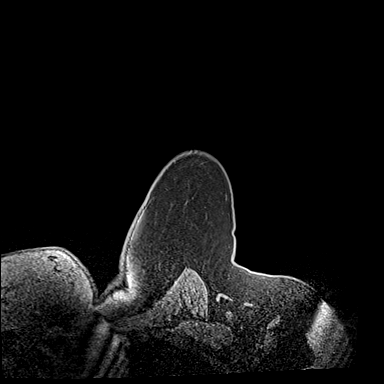
[im 144/144]
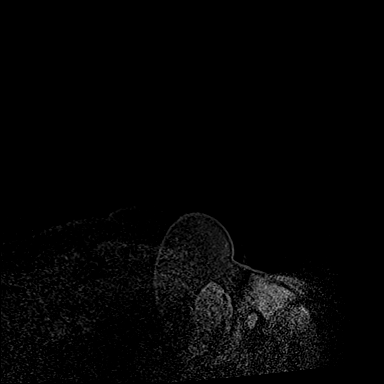

[Series 4: dynamic post 20 · axial · 1.3mm · 0.73mm/px · z∈[-81,+105]mm · 5 of 144 slices shown (1 of 2)]
[im 1/144]
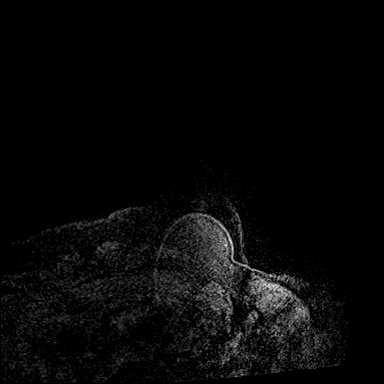
[im 36/144]
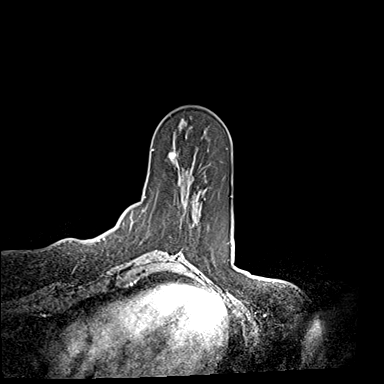
[im 72/144]
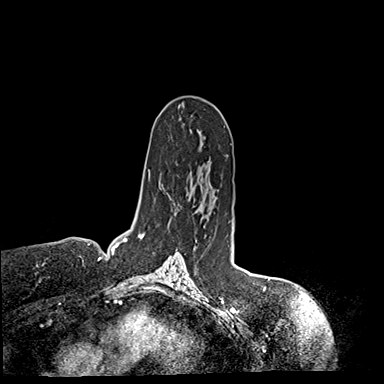
[im 108/144]
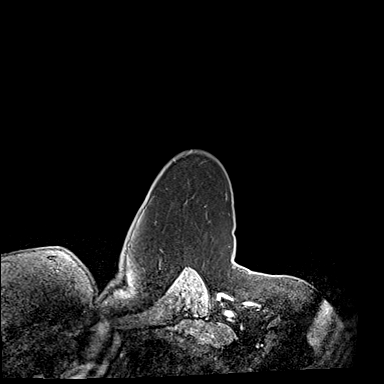
[im 144/144]
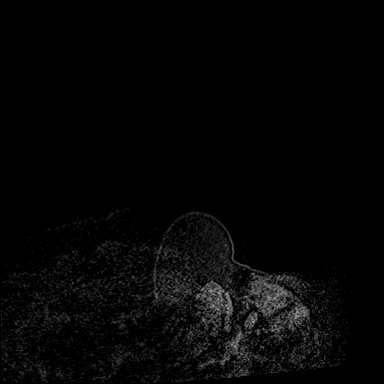

[Series 5: dynamic post 20 · axial · 1.3mm · 0.73mm/px · z∈[-81,+105]mm · 5 of 144 slices shown (2 of 2)]
[im 1/144]
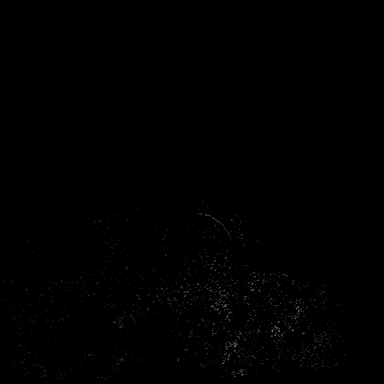
[im 36/144]
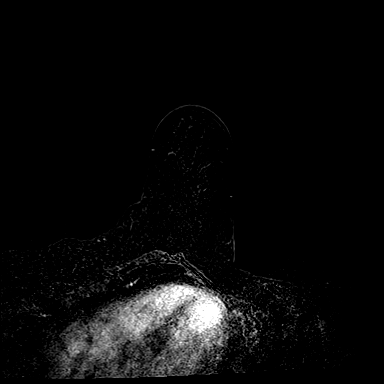
[im 72/144]
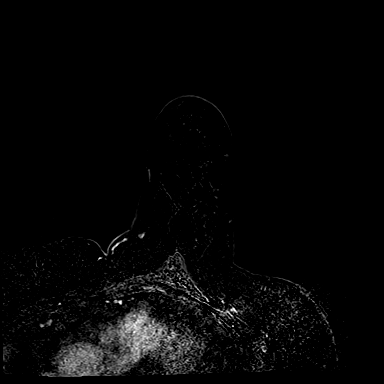
[im 108/144]
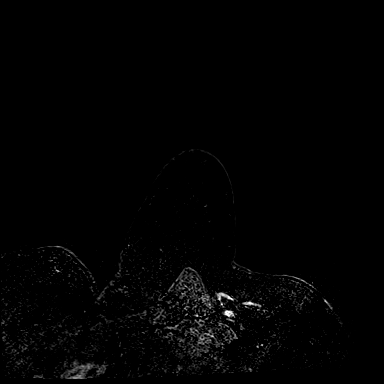
[im 144/144]
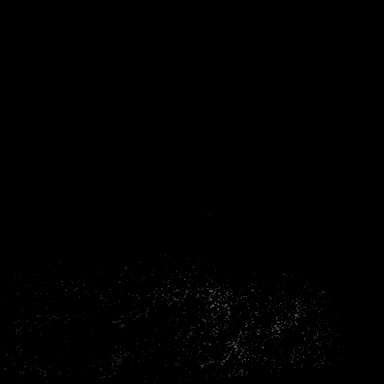

[Series 6: dynamic post 3 · axial · 1.3mm · 0.73mm/px · z∈[-81,+105]mm · 5 of 144 slices shown (1 of 2)]
[im 1/144]
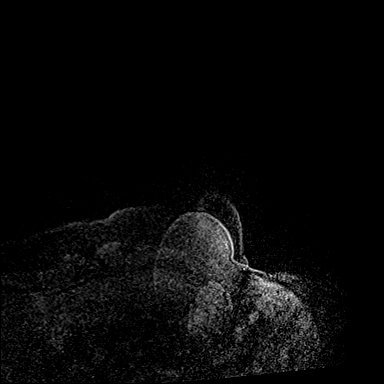
[im 36/144]
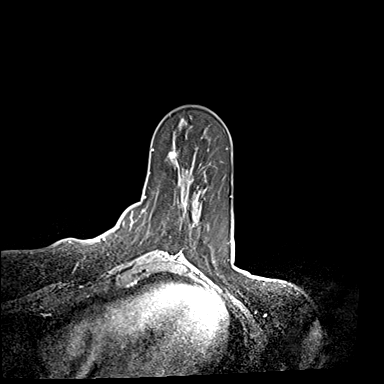
[im 72/144]
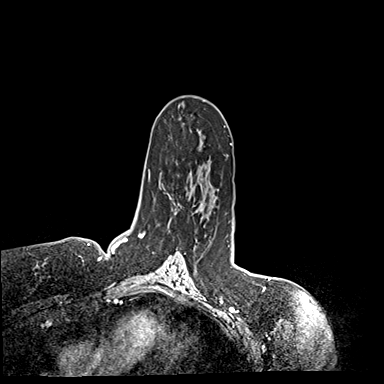
[im 108/144]
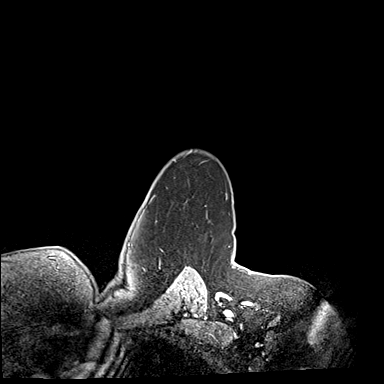
[im 144/144]
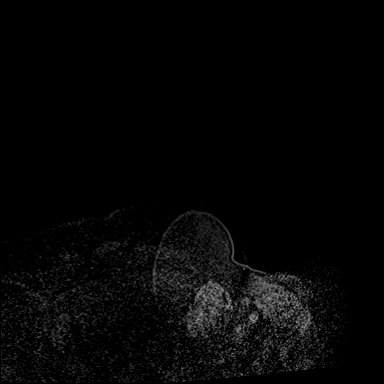

[Series 7: dynamic post 3 · axial · 1.3mm · 0.73mm/px · z∈[-81,+105]mm · 5 of 144 slices shown (2 of 2)]
[im 1/144]
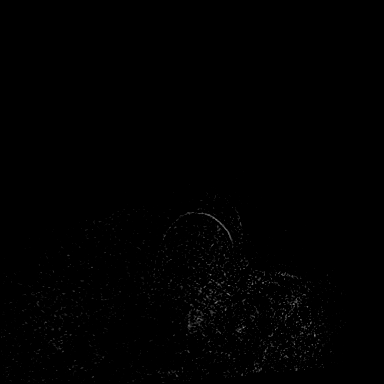
[im 36/144]
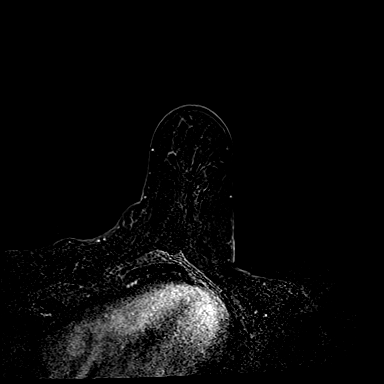
[im 72/144]
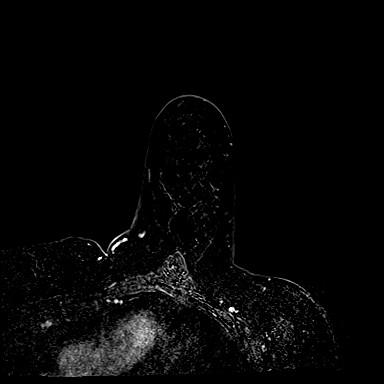
[im 108/144]
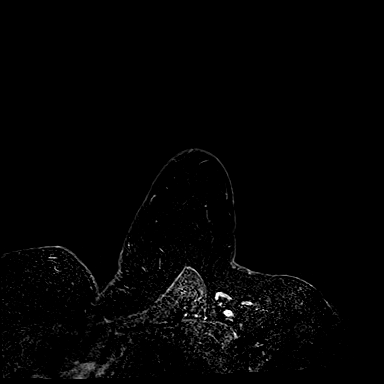
[im 144/144]
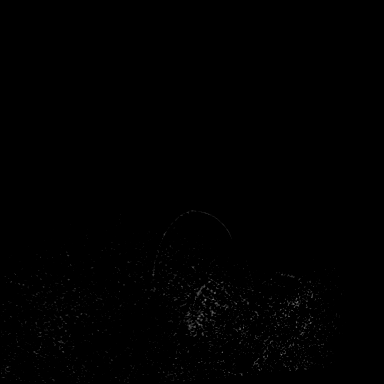

[Series 8: needle confirmation · axial · 1.3mm · 0.73mm/px · z∈[-81,+11]mm · 3 of 144 slices shown]
[im 1/144]
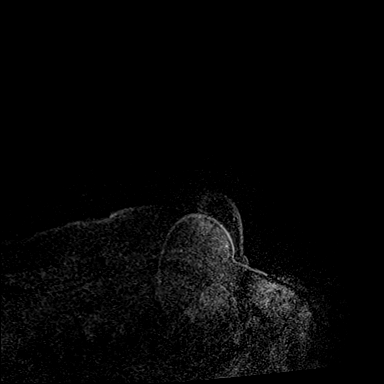
[im 36/144]
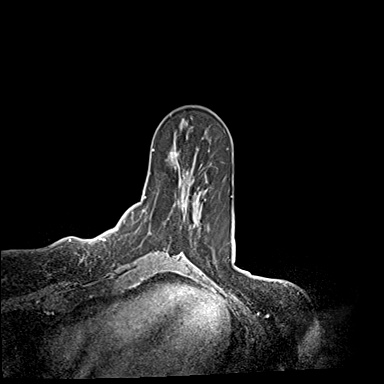
[im 72/144]
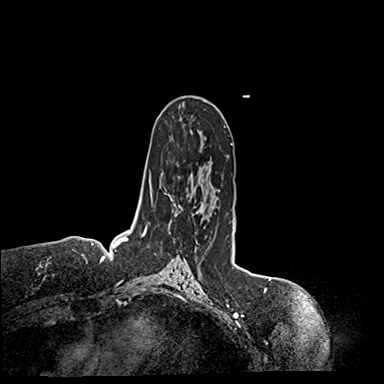

[31 of 48 positions shown; findings below may reference images not displayed]

Personal history of excisional biopsy of lobular neoplasia (ALH)
from the LEFT breast in [DATE] and [DATE]. Personal
history of stereotactic core needle biopsy of the LEFT breast in
[DATE], pathology lobular neoplasia (ALH), fibrocystic
changes and PASH (coil shaped clip) which was not excised.

EXAM:
MRI GUIDED CORE NEEDLE BIOPSY OF THE LEFT BREAST
FINDINGS: I met with the patient, and we discussed the procedure of MRI guided
biopsy, including risks, benefits, and alternatives. Specifically,
we discussed the risks of infection, bleeding, tissue injury, clip
migration, and inadequate sampling. Informed, written consent was
given. The usual time out protocol was performed immediately prior
to the procedure.

Lesion quadrant: UPPER INNER QUADRANT.

Using sterile technique with chlorhexidine as skin antisepsis, 1%
lidocaine and 1% lidocaine with epinephrine as local anesthetic,
using MRI guidance, a 9 gauge vacuum assisted device was used to
perform biopsy of the enhancing mass in the UPPER INNER QUADRANT of
the LEFT breast using a LATERAL approach.

At the conclusion of the procedure, a a barbell shaped tissue marker
clip was deployed into the biopsy cavity. Follow-up 2-view mammogram
was performed and dictated separately.
IMPRESSION: MRI guided biopsy of an indeterminate enhancing 6 mm mass involving
the UPPER INNER QUADRANT of the LEFT breast. No apparent
complications.

ADDENDUM:
Pathology revealed LOBULAR NEOPLASIA (ATYPICAL LOBULAR HYPERPLASIA),
FAT NECROSIS of the Left breast, upper inner quadrant posterior
depth. This was found to be concordant by Dr. MOMIRKA, with
surgical consultation for discussion of excision recommended.

Pathology results were discussed with the patient by telephone. The
patient reported doing well after the biopsy with tenderness at the
site. Post biopsy instructions and care were reviewed and questions
were answered. The patient was encouraged to call The [REDACTED] for any additional concerns. My direct phone
number was provided.

Surgical consultation has been arranged with Dr. MOMIRKA,
per patient request, at [REDACTED] on [DATE].

The patient is scheduled for a BILATERAL diagnostic mammogram on
[DATE]. The patient should return for annual high risk
screening MRI in [DATE].

Pathology results reported by MOMIRKA, RN on [DATE].

*** End of Addendum ***
Personal history of excisional biopsy of lobular neoplasia (ALH)
from the LEFT breast in [DATE] and [DATE]. Personal
history of stereotactic core needle biopsy of the LEFT breast in
[DATE], pathology lobular neoplasia (ALH), fibrocystic
changes and PASH (coil shaped clip) which was not excised.

EXAM:
MRI GUIDED CORE NEEDLE BIOPSY OF THE LEFT BREAST
FINDINGS: I met with the patient, and we discussed the procedure of MRI guided
biopsy, including risks, benefits, and alternatives. Specifically,
we discussed the risks of infection, bleeding, tissue injury, clip
migration, and inadequate sampling. Informed, written consent was
given. The usual time out protocol was performed immediately prior
to the procedure.

Lesion quadrant: UPPER INNER QUADRANT.

Using sterile technique with chlorhexidine as skin antisepsis, 1%
lidocaine and 1% lidocaine with epinephrine as local anesthetic,
using MRI guidance, a 9 gauge vacuum assisted device was used to
perform biopsy of the enhancing mass in the UPPER INNER QUADRANT of
the LEFT breast using a LATERAL approach.

At the conclusion of the procedure, a a barbell shaped tissue marker
clip was deployed into the biopsy cavity. Follow-up 2-view mammogram
was performed and dictated separately.
IMPRESSION: MRI guided biopsy of an indeterminate enhancing 6 mm mass involving
the UPPER INNER QUADRANT of the LEFT breast. No apparent
complications.

## 2020-05-06 IMAGING — MG MM BREAST LOCALIZATION CLIP
4 series · 4 of 12 positions shown · non-contrast
Comparison: Previous exam(s).

CLINICAL DATA: Confirmation of clip placement after MRI guided core
needle biopsy of a 6 mm mass involving the UPPER INNER QUADRANT of
the LEFT breast at POSTERIOR depth.

EXAM:
2D AND TOMOSYNTHESIS DIAGNOSTIC LEFT MAMMOGRAM POST MRI BIOPSY

[L CC synth-2D]
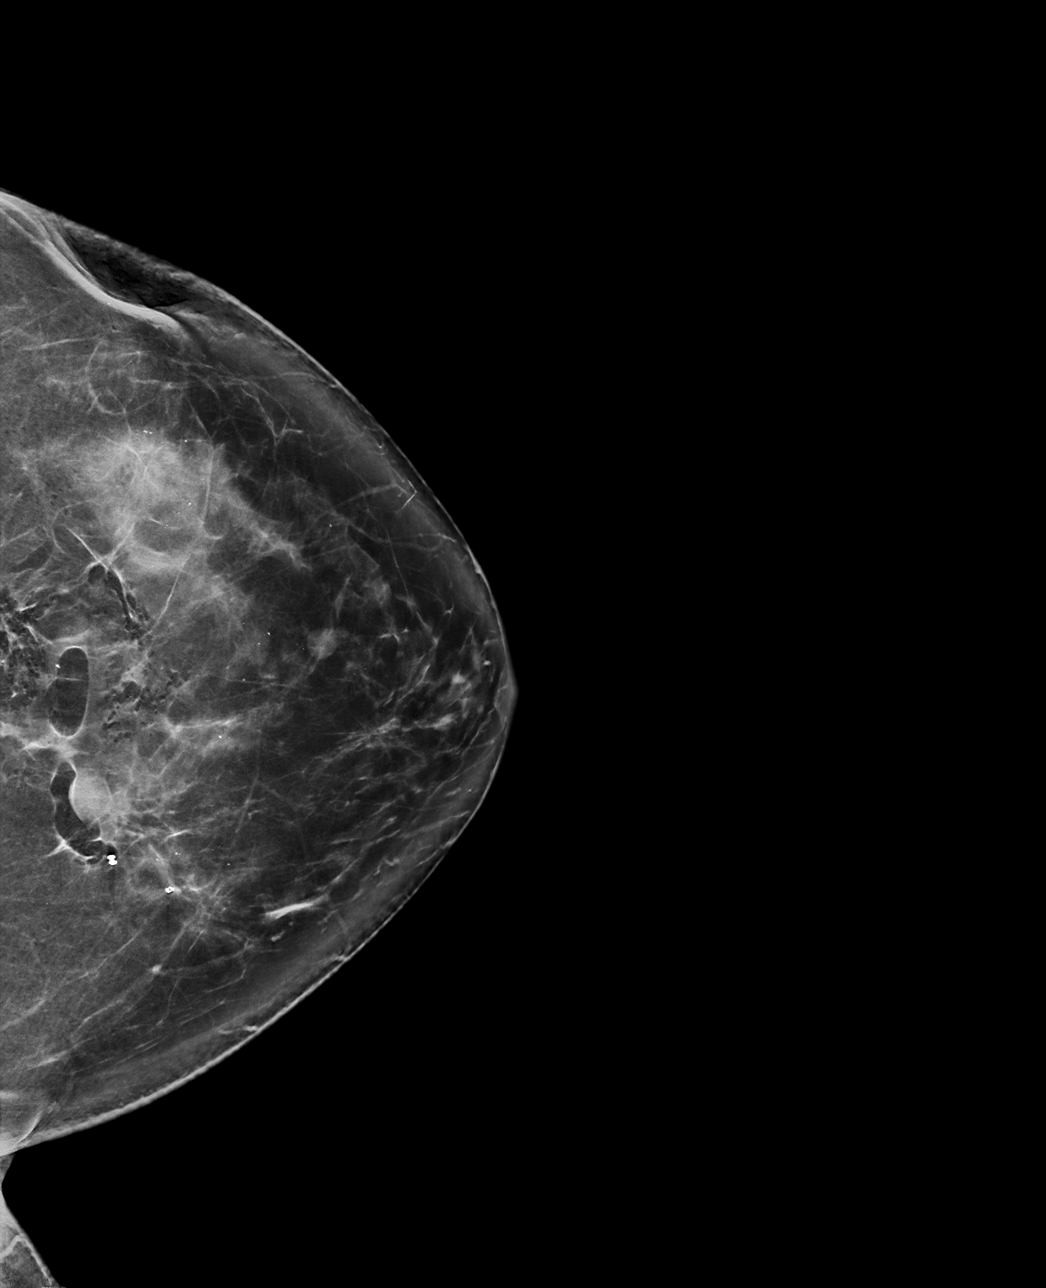

[L ML synth-2D]
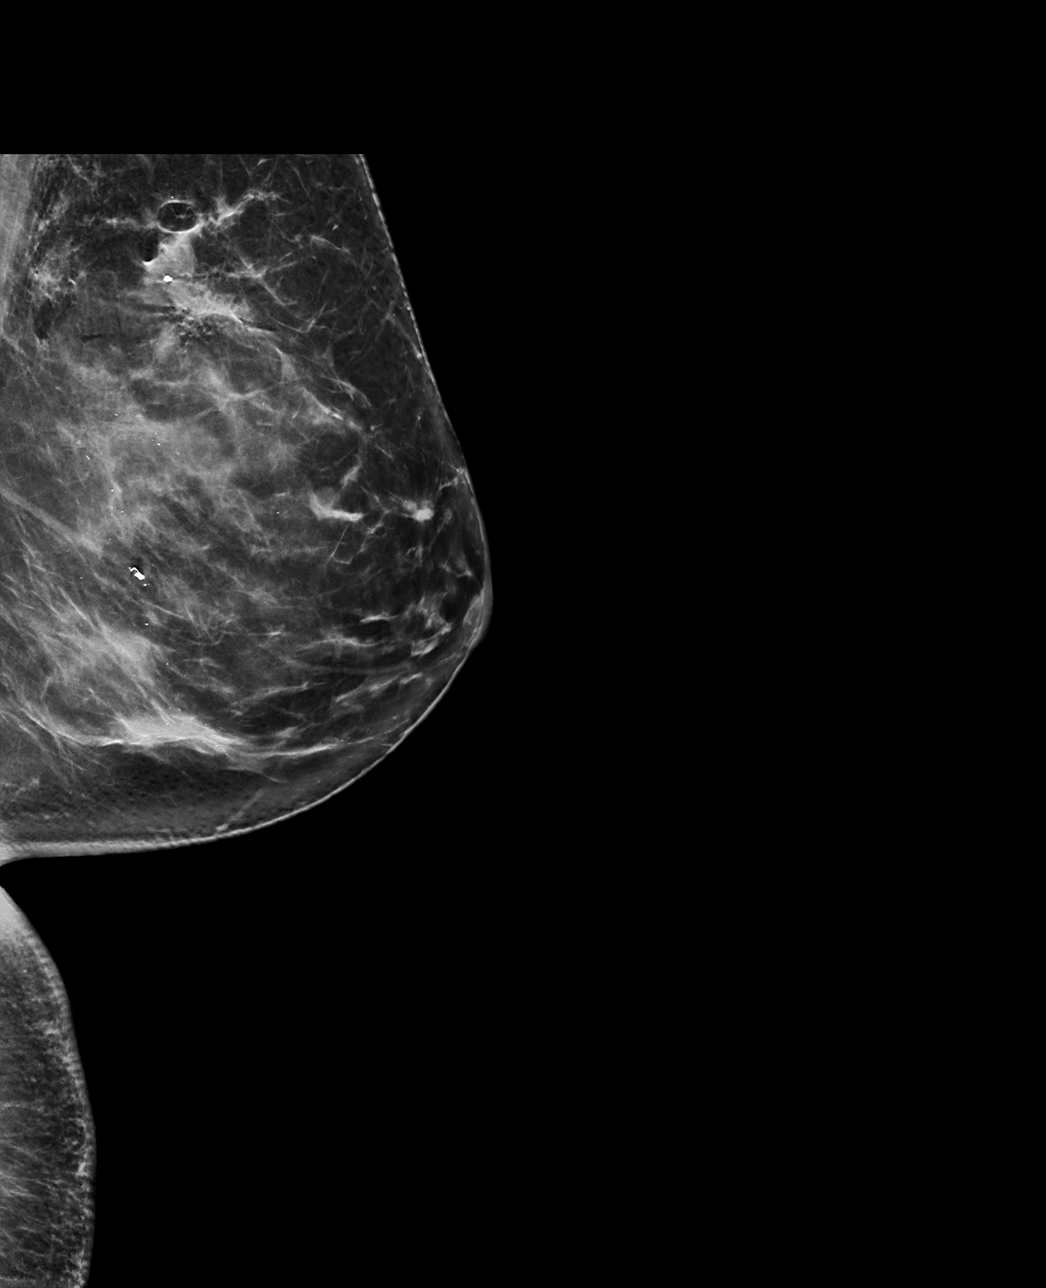

[L ML tomo · tomo slice 43/84.0]
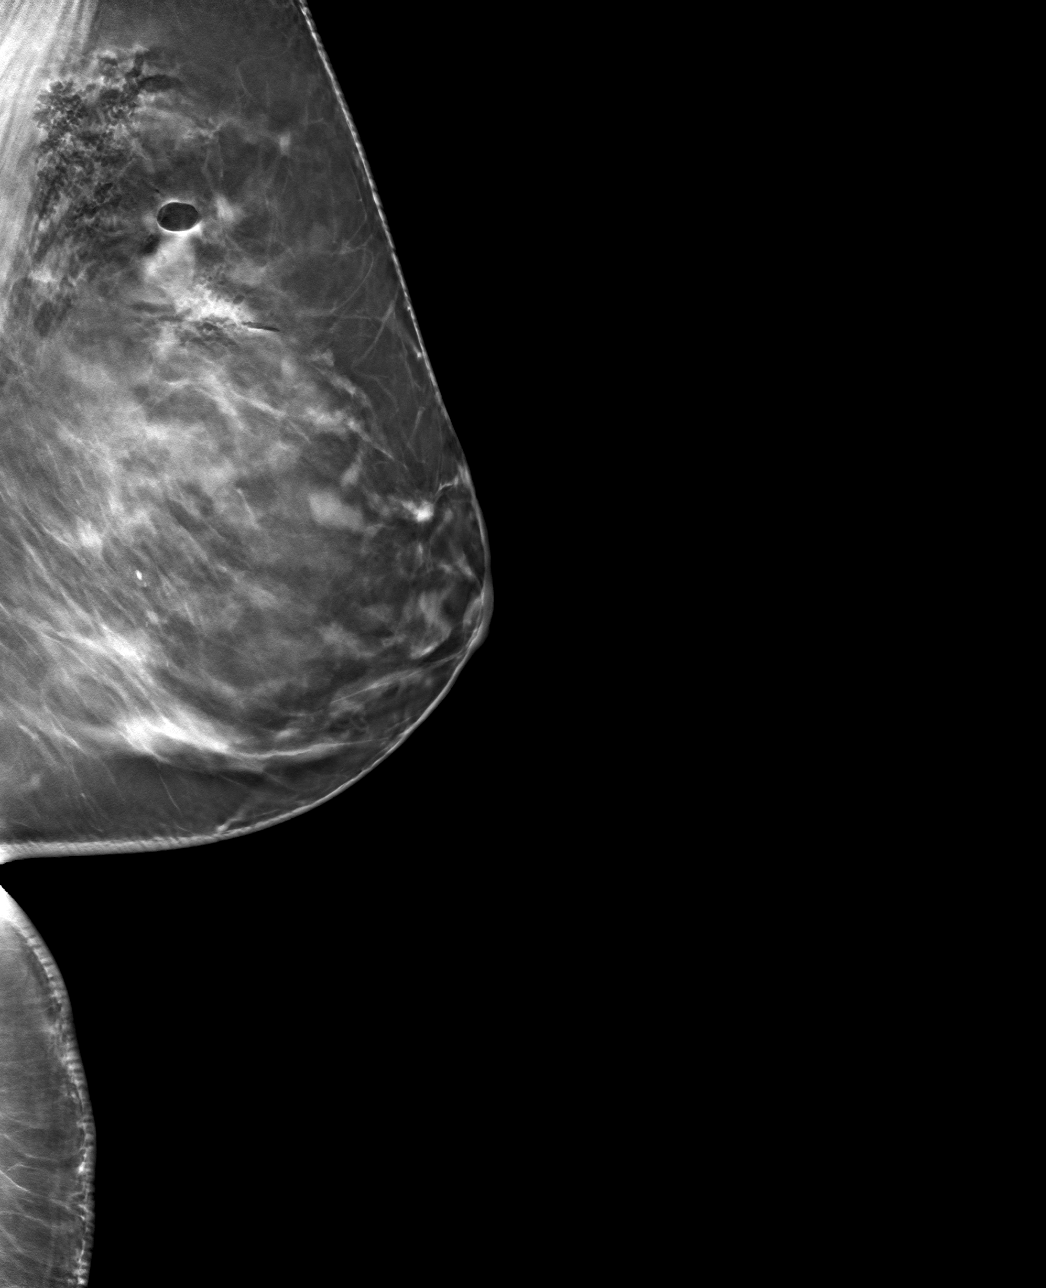

[L CC tomo · tomo slice 43/85.0]
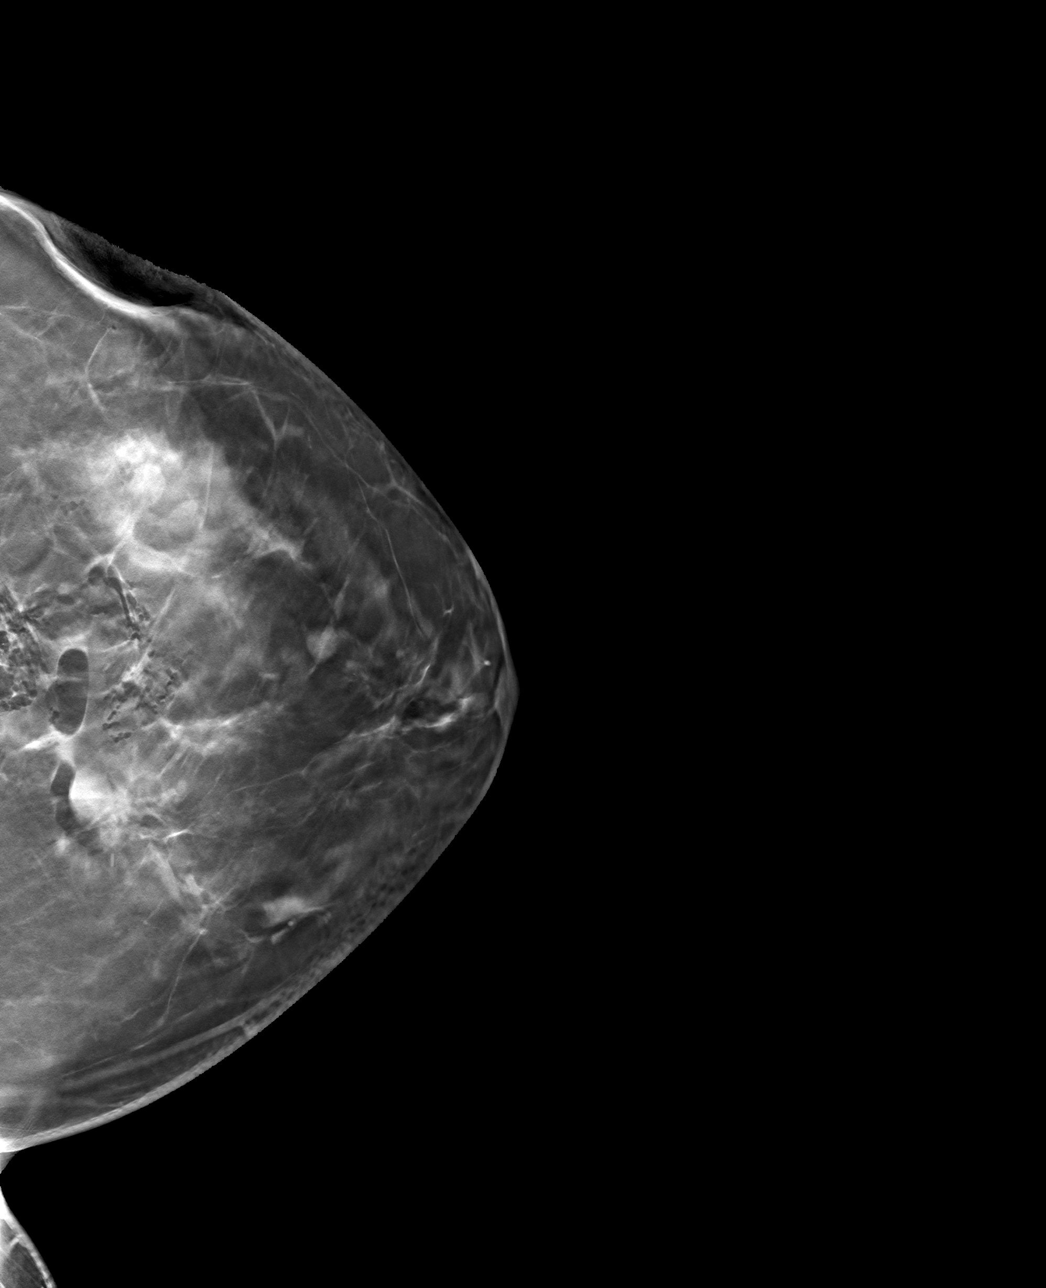

[4 of 12 positions shown; findings below may reference images not displayed]

FINDINGS: Tomosynthesis and synthesized full field CC and mediolateral images
were obtained following MRI guided biopsy of a mass involving the
UPPER INNER QUADRANT of the LEFT breast at POSTERIOR depth. The
barbell shaped tissue marking clip is appropriately positioned at
the site of the biopsied mass in the UPPER INNER QUADRANT at
POSTERIOR depth.

Expected post biopsy changes are present without evidence of
hematoma.
IMPRESSION: Appropriate positioning of the barbell shaped biopsy marking clip at
the site of the biopsied mass in the UPPER INNER QUADRANT of the
LEFT breast at POSTERIOR depth.

Final Assessment: Post Procedure Mammograms for Marker Placement

## 2020-05-06 MED ORDER — GADOBUTROL 1 MMOL/ML IV SOLN
8.0000 mL | Freq: Once | INTRAVENOUS | Status: AC | PRN
  Administered 2020-05-06: 8 mL via INTRAVENOUS

## 2020-05-07 ENCOUNTER — Other Ambulatory Visit: Payer: Self-pay | Admitting: Adult Health

## 2020-05-07 DIAGNOSIS — N649 Disorder of breast, unspecified: Secondary | ICD-10-CM

## 2020-05-07 NOTE — Progress Notes (Signed)
Subjective:   Patient ID: Mariah Haley, female   DOB: 59 y.o.   MRN: [id number]   HPI Patient states doing well with surgery and satisfied so far with recovery   ROS      Objective:  Physical Exam  Neurovascular status intact negative Bevelyn Buckles' sign noted wound edges well coapted hallux in rectus position     Assessment:  Doing well post osteotomy     Plan:  H&P x-ray reviewed continue with moderate compression elevation and immobilization and reappoint for [place] to recheck satisfied with how recovery is going  X-ray indicates osteotomy is healing well no indication of movement joint congruence

## 2020-05-27 ENCOUNTER — Other Ambulatory Visit: Payer: Self-pay

## 2020-05-27 ENCOUNTER — Ambulatory Visit
Admission: RE | Admit: 2020-05-27 | Discharge: 2020-05-27 | Disposition: A | Discharge status: Home / Self Care | Payer: BC Managed Care – PPO | Source: Ambulatory Visit | Attending: Adult Health | Admitting: Adult Health

## 2020-05-27 DIAGNOSIS — N649 Disorder of breast, unspecified: Secondary | ICD-10-CM

## 2020-05-27 IMAGING — MG DIGITAL DIAGNOSTIC BILAT W/ TOMO W/ CAD
8 series · 8 of 24 positions shown · non-contrast
Comparison: Previous exam(s).

CLINICAL DATA: 59-year-old female with history of 2 left breast
excisions for ALH. The patient had an additional biopsy in [DATE] in the left breast, also demonstrating ALH. This area has not
yet been excised. Previous stereotactic biopsy of the right breast
demonstrated concordant, benign findings. She presents today for
annual bilateral mammogram.

EXAM:
DIGITAL DIAGNOSTIC BILATERAL MAMMOGRAM WITH TOMO AND CAD

[R MLO synth-2D]
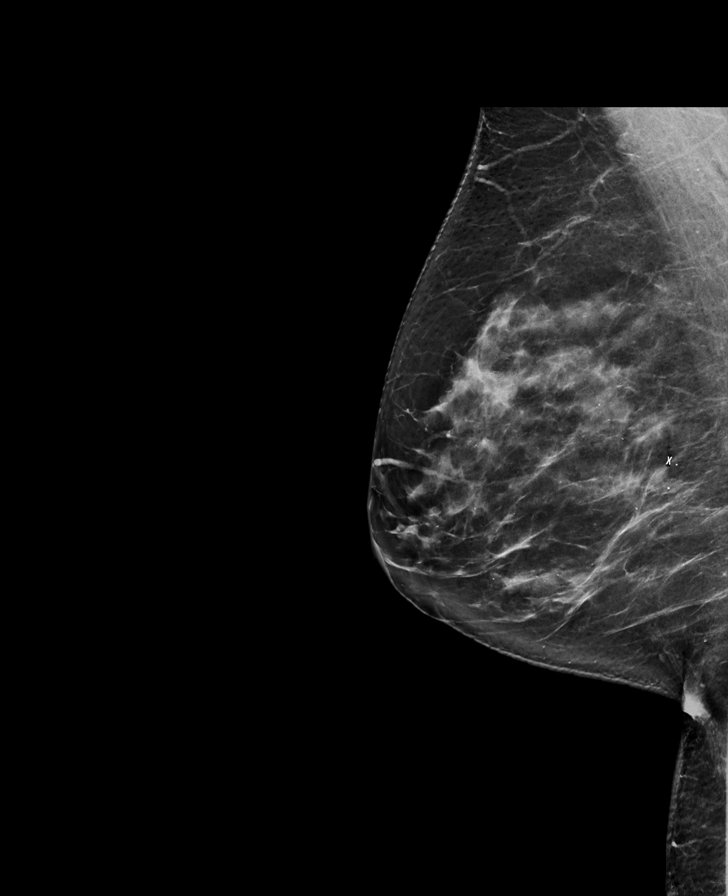

[L CC synth-2D]
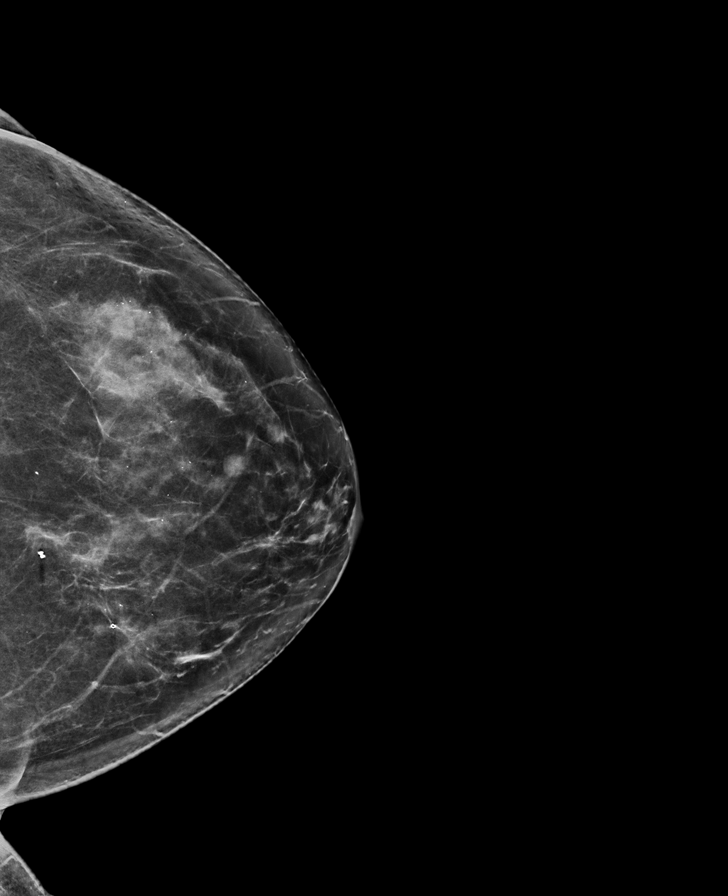

[L MLO synth-2D]
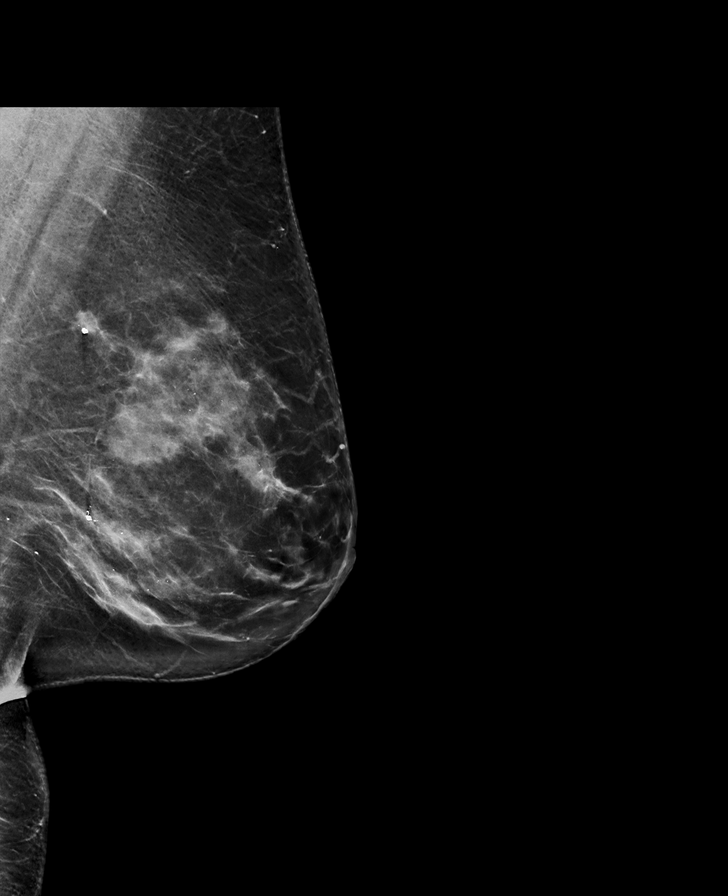

[R CC synth-2D]
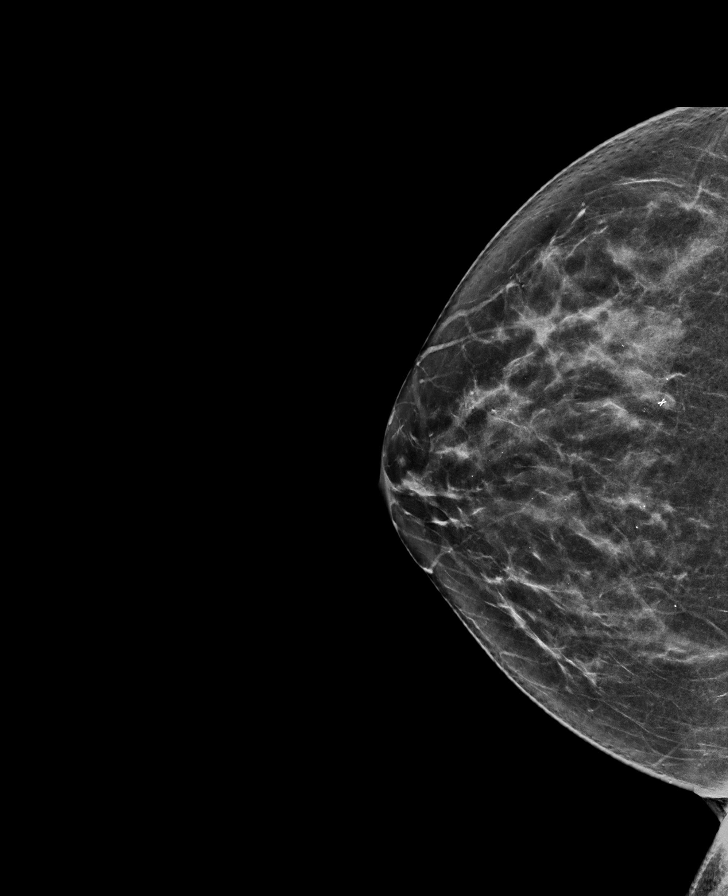

[L CC tomo · tomo slice 36/71.0]
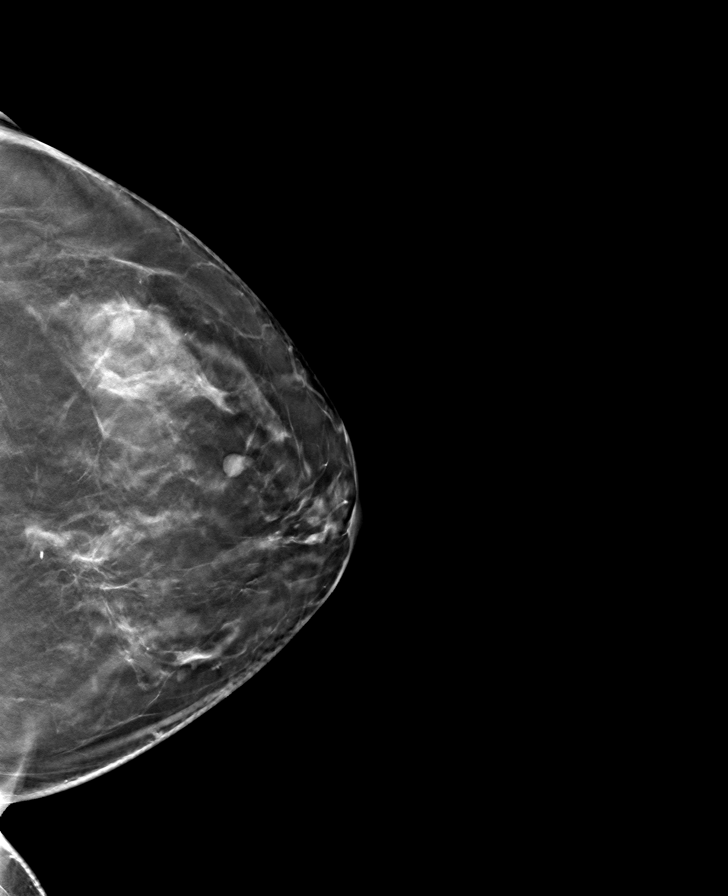

[R CC tomo · tomo slice 35/70.0]
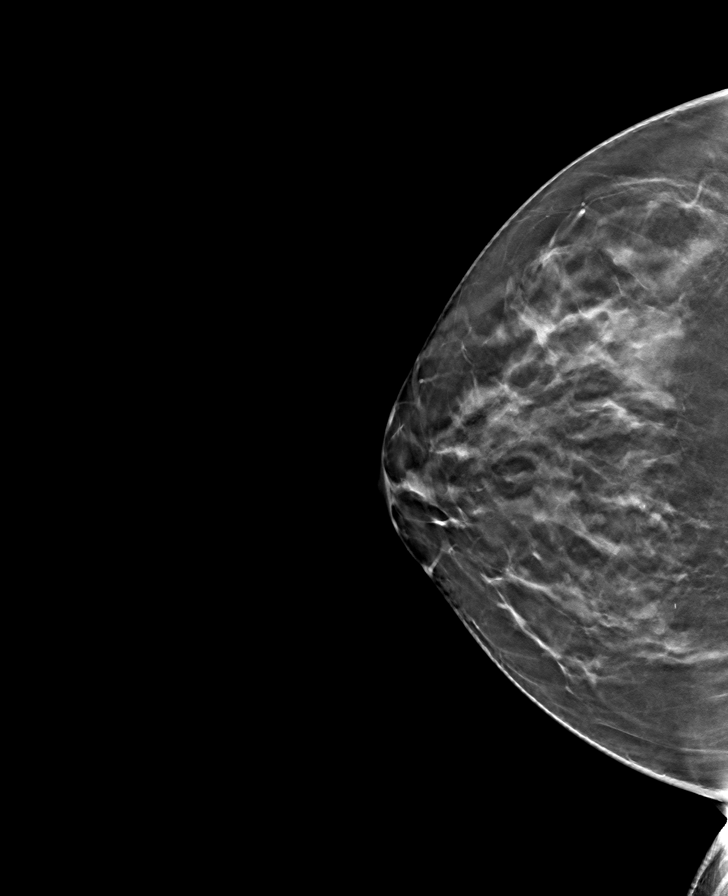

[R MLO tomo · tomo slice 41/81.0]
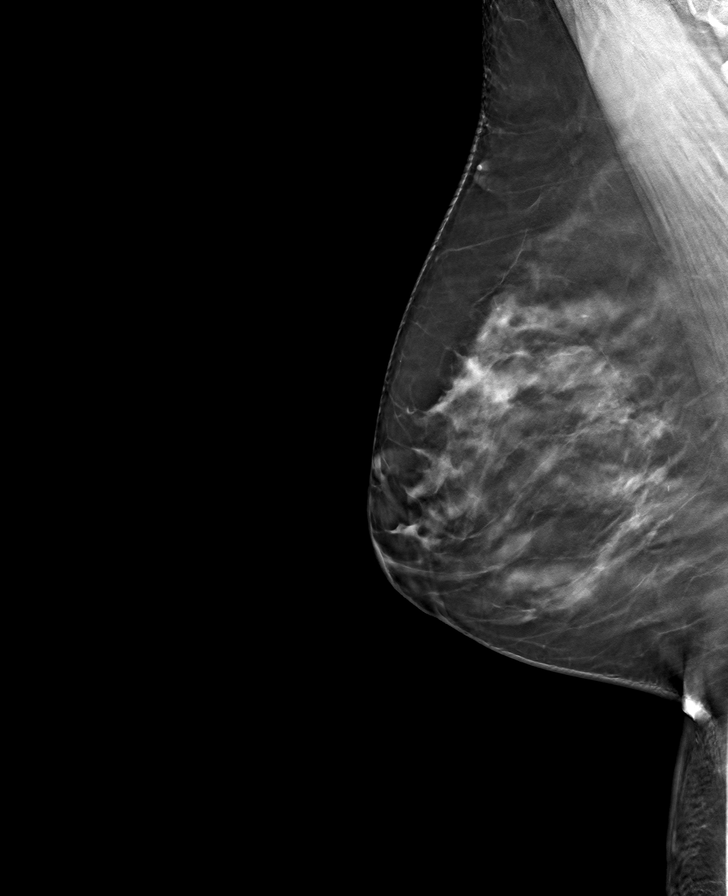

[L MLO tomo · tomo slice 45/88.0]
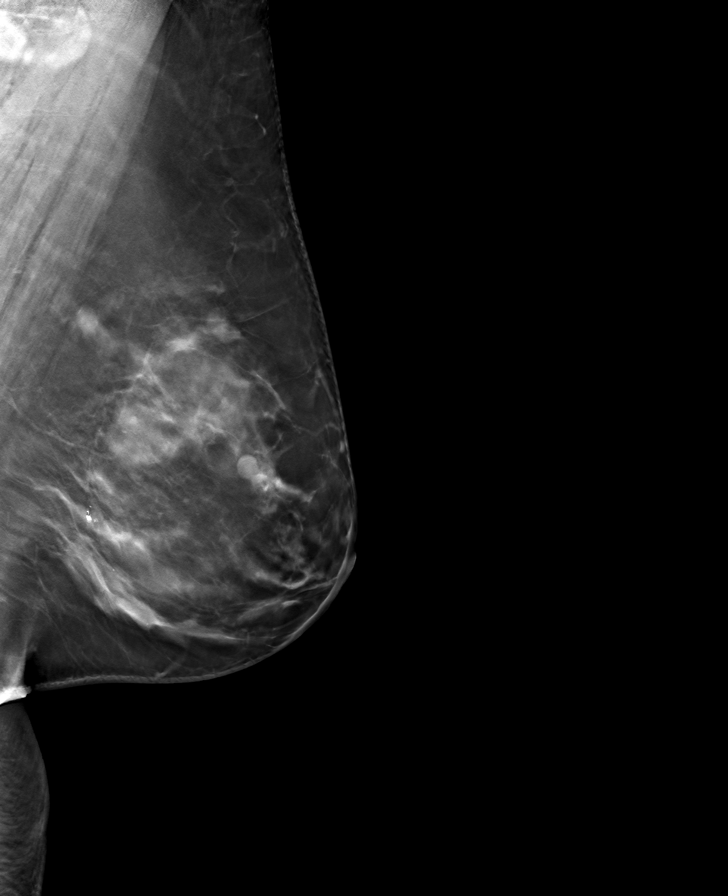

[8 of 24 positions shown; findings below may reference images not displayed]

ACR Breast Density Category c: The breast tissue is heterogeneously
dense, which may obscure small masses.
FINDINGS: Interval postsurgical changes with an associated barbell clip is
demonstrated in the upper inner left breast at far posterior depth.
A coil shaped clip in the lower inner left breast at mid to
posterior depth is also stable. An X shaped clip is identified in
the deep central right breast. No other new or suspicious
mammographic findings are identified in either breast.

Mammographic images were processed with CAD.
IMPRESSION: 1. No mammographic evidence of malignancy in either breast.
2. Interval post biopsy changes in the upper inner left breast at
far posterior depth, correlating with most recent diagnosis of ALH
on MRI guided biopsy.
3. Stable post biopsy changes in the lower inner left in deep
central right breast.

RECOMMENDATION:
1. Recommendation stands for surgical consultation to discuss
possible excision of most recent ALH diagnosis. The patient has an
appointment with her surgeon later this month.
2. Otherwise, annual bilateral mammogram in 1 year.

I have discussed the findings and recommendations with the patient.
If applicable, a reminder letter will be sent to the patient
regarding the next appointment.

BI-RADS CATEGORY  2: Benign.

## 2020-06-02 ENCOUNTER — Encounter: Payer: Self-pay | Admitting: Podiatry

## 2020-06-02 ENCOUNTER — Ambulatory Visit (INDEPENDENT_AMBULATORY_CARE_PROVIDER_SITE_OTHER): Payer: BC Managed Care – PPO | Admitting: Podiatry

## 2020-06-02 ENCOUNTER — Other Ambulatory Visit: Payer: Self-pay

## 2020-06-02 ENCOUNTER — Ambulatory Visit (INDEPENDENT_AMBULATORY_CARE_PROVIDER_SITE_OTHER): Payer: BC Managed Care – PPO

## 2020-06-02 DIAGNOSIS — M21611 Bunion of right foot: Secondary | ICD-10-CM

## 2020-06-02 DIAGNOSIS — M21619 Bunion of unspecified foot: Secondary | ICD-10-CM

## 2020-06-03 NOTE — Progress Notes (Signed)
Subjective:   Patient ID: Mariah Haley, female   DOB: 60 y.o.   MRN: [id number]   HPI Patient states doing well with surgery   ROS      Objective:  Physical Exam  Neurovascular status intact negative Bevelyn Buckles' sign noted right foot healing well wound edges well coapted with alignment in place     Assessment:  Doing well post surgical correction right foot     Plan:  H&P reviewed condition recommended the continuation of elevation compression immobilization and gradual increase in activities and reappoint to recheck  X-rays indicate osteotomies healing well fixation in place alignment good

## 2020-07-11 ENCOUNTER — Other Ambulatory Visit: Payer: Self-pay

## 2020-07-11 MED ORDER — TAMOXIFEN CITRATE 20 MG PO TABS: 20.0000 mg | ORAL_TABLET | Freq: Every day | ORAL | 0 refills | Status: DC

## 2020-07-14 ENCOUNTER — Other Ambulatory Visit: Payer: Self-pay

## 2020-07-14 ENCOUNTER — Ambulatory Visit (INDEPENDENT_AMBULATORY_CARE_PROVIDER_SITE_OTHER): Payer: BC Managed Care – PPO | Admitting: Podiatry

## 2020-07-14 ENCOUNTER — Ambulatory Visit (INDEPENDENT_AMBULATORY_CARE_PROVIDER_SITE_OTHER): Payer: BC Managed Care – PPO

## 2020-07-14 DIAGNOSIS — M21621 Bunionette of right foot: Secondary | ICD-10-CM | POA: Diagnosis not present

## 2020-07-14 DIAGNOSIS — M779 Enthesopathy, unspecified: Secondary | ICD-10-CM

## 2020-07-14 DIAGNOSIS — M21619 Bunion of unspecified foot: Secondary | ICD-10-CM

## 2020-07-14 MED ORDER — DICLOFENAC SODIUM 75 MG PO TBEC: 75.0000 mg | DELAYED_RELEASE_TABLET | Freq: Two times a day (BID) | ORAL | 2 refills | Status: DC

## 2020-07-15 NOTE — Progress Notes (Signed)
Subjective:   Patient ID: Mariah Haley, female   DOB: 60 y.o.   MRN: [id number]   HPI Patient states doing great with surgery but just wanted to get everything checked   ROS      Objective:  Physical Exam  Neurovascular status intact negative Bevelyn Buckles' sign noted foot healing well wound edges well coapted coapted good alignment     Assessment:  Doing well post osteotomy right first metatarsal     Plan:  H&P condition reviewed recommended gradual increase in activity and explained this to the patient and at this point patient is discharged after reviewing x-ray  X-ray indicates osteotomy is healing well good alignment no indications pathology

## 2020-08-04 ENCOUNTER — Encounter: Payer: Self-pay | Admitting: Oncology

## 2020-08-04 NOTE — Progress Notes (Signed)
[place]  Telephone:([phone]) [phone] Fax:(336) [phone]    ID: Evelynn Hench DOB: 19-Oct-1960  MR#: [id number]  GEZ#:[id number]  Patient Care Team: Fortino Sic, PA as PCP - General (Family Medicine) Madhav Mohon, Virgie Dad, MD as Consulting Physician (Oncology) Rolm Bookbinder, MD as Consulting Physician (General Surgery) Hale Bogus., MD as Referring Physician (Gastroenterology) Jonathon Resides, MD as Consulting Physician (Family Medicine) OTHER MD:   CHIEF COMPLAINT: Lobular carcinoma in situ; breast cancer high risk  CURRENT TREATMENT: Tamoxifen; intensified screening   INTERVAL HISTORY: Mariah Haley returns today for follow up of her LCIS and [place].  She continues on tamoxifen.  Her hot flashes are better.  She has some vaginal wetness.  Overall she is tolerating this well  Since her last visit, she underwent breast MRI on 03/29/2020 showing: breast composition B; indeterminate, enhancing 6 mm mass posterior to previous lumpectomy bed in upper-inner left breast; no other suspicious findings; no evidence right breast malignancy.  She proceeded to biopsy of the left breast area in question on 05/06/2020. Pathology from the procedure ([id number]) showed: lobular neoplasia (atypical lobular hyperplasia); fat necrosis.  She also underwent bilateral diagnostic mammography with tomography at [facility] on 05/27/2020 showing: breast density category C; no evidence of malignancy in either breast.    REVIEW OF SYSTEMS: Mariah Haley did well with her biopsies, with no unusual side effects.  Because of her toe surgery and other issues she had met her deductible so the MRI in November had no out-of-pocket co-pay.  She is exercising more and has a better diet so she is lost about 20 pounds she says.  She is taking a little bit of diclofenac for her knee.  Overall a detailed review of systems today was benign   COVID 19 VACCINATION STATUS: Status post Moderna x2 with  booster October 2021   HISTORY OF CURRENT ILLNESS: From the original intake note:  "Mariah Haley" Berkleigh Beckles had routine screening mammography on 03/07/2018 showing a possible abnormality in the left breast. She underwent unilateral left diagnostic mammography with tomography and left breast ultrasonography at [facility] on 03/14/2018 showing: Breast Density Category C. Additional mammographic views of the left breast demonstrate no reproducible distortion in the medial left breast, posterior depth. There is however an asymmetry with a few associated microcalcifications in the medial left breast, posterior depth, seen on both spot compression craniocaudal tomosynthesis views, image 41/69 and image 37/64. On physical exam, no suspicious masses are palpated. Targeted ultrasound is performed, showing dense breast parenchyma, but no sonographically identifiable mass. There is no evidence of left axillary lymphadenopathy.  Accordingly on 03/20/2018 she proceeded to biopsy of the left breast area in question. The pathology from this procedure showed [id number]):  Breast, left, needle core biopsy, medial - lobular neoplasia (atypical lobular hyperplasia). - fibrocystic changes with calcifications.   - pseudoangiomatous stromal hyperplasia (pash).  She underwent repeat unilateral left diagnostic mammography with tomography at [facility] on 11/10/2018 showing: Breast Density Category C. Indeterminate left breast asymmetry as originally identified on the patient's screening and diagnostic workup. A biopsy clip at the site of patient's known atypical lobular hyperplasia is approximately 2 cm medial to this area.  On 11/20/2018 she proceeded to biopsy of the left breast area in question. The pathology from this procedure showed ([id number]): Breast, left, needle core biopsy, uiq, posterior - lobular neoplasia (atypical lobular hyperplasia). - fibrocystic changes with calcifications.   -  pseudoangiomatous stromal hyperplasia (pash).  Then, on 01/17/2019,  she underwent a left lumpectomy. The pathology from this procedure showed ([id number]):  Breast, lumpectomy, Left w/seed   - lobular carcinoma in situ  The patient's subsequent history is as detailed below.   PAST MEDICAL HISTORY: Past Medical History:  Diagnosis Date  . Gilbert's syndrome   . Hypercholesterolemia      PAST SURGICAL HISTORY: Past Surgical History:  Procedure Laterality Date  . BREAST LUMPECTOMY Left 01/17/2019   [facility] (LCIS)  . DILATION AND CURETTAGE OF UTERUS    . EYE SURGERY     "as a child"  . RADIOACTIVE SEED GUIDED EXCISIONAL BREAST BIOPSY Left 01/17/2019   Procedure: RADIOACTIVE SEED GUIDED LEFT BREAST LUMPECTOMY;  Surgeon: Rolm Bookbinder, MD;  Location: [facility];  Service: General;  Laterality: Left;  . RADIOACTIVE SEED GUIDED EXCISIONAL BREAST BIOPSY Left 07/17/2019   Procedure: RADIOACTIVE SEED GUIDED EXCISIONAL LEFT BREAST BIOPSY;  Surgeon: Rolm Bookbinder, MD;  Location: [facility];  Service: General;  Laterality: Left;    FAMILY HISTORY: No family history on file. Kacy's father died at the age of 58 from heart problems (first heart attack age 78). Patients' mother is 106 years old as of September 2020. The patient has 1 brothers and 2 sisters. Patient denies anyone in her family having breast, ovarian, prostate, or pancreatic cancer.  There are some lung cancers in the family, all of them smokers   GYNECOLOGIC HISTORY:  Patient's last menstrual period was 02/10/2011. 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  Her husband Chalmer "Shanon Brow" is a former Arboriculturist, now retired.  Daughter  Hulen Skains, 56, is a Licensed conveyancer at [employer] in [place]; daughter Melvern Banker is an Armed forces technical officer in [place]; Dr. Barnetta Chapel is getting it certified social work degree in [place].  No grandchildren.  The patient is a [employer]   ADVANCED DIRECTIVES: In the absence of documents to the contrary the patient's husband is her healthcare power of attorney   HEALTH MAINTENANCE: Social History   Tobacco Use  . Smoking status: Never Smoker  . Smokeless tobacco: Never Used  Substance Use Topics  . Alcohol use: Yes    Comment: rare  . Drug use: Never    Colonoscopy: 2019/[facility]  PAP: 2018/ [facility]  Bone density: no   No Known Allergies  Current Outpatient Medications  Medication Sig Dispense Refill  . diclofenac (VOLTAREN) 75 MG EC tablet Take 1 tablet (75 mg total) by mouth 2 (two) times daily. 50 tablet 2  . tamoxifen (NOLVADEX) 20 MG tablet Take 1 tablet (20 mg total) by mouth daily. 90 tablet 4  . valACYclovir (VALTREX) 500 MG tablet      No current facility-administered medications for this visit.    OBJECTIVE: White woman in no acute distress  Vitals:   08/05/20 0913 08/05/20 0915  BP: (!) 158/107 (!) 154/85  Pulse: 80   Resp: 19   Temp: (!) 97.5 F (36.4 C)   SpO2: 100%    Wt Readings from Last 3 Encounters:  08/05/20 171 lb 1.6 oz (77.6 kg)  11/27/19 195 lb (88.5 kg)  07/17/19 191 lb 12.8 oz (87 kg)   Body mass index is 33.42 kg/m.    Repeat blood pressure  154/85  ECOG FS:1 - Symptomatic but completely ambulatory  Sclerae unicteric, EOMs intact Wearing a mask No cervical or supraclavicular adenopathy Lungs no rales or rhonchi Heart regular rate and rhythm Abd soft, nontender, positive bowel sounds MSK no focal spinal tenderness, no upper extremity lymphedema Neuro: nonfocal, well oriented, appropriate affect Breasts: I do not palpate a mass in either breast.  There are no skin or nipple changes of concern.  Both axillae are benign   LAB  RESULTS:  CMP  No results found for: NA, K, CL, CO2, GLUCOSE, BUN, CREATININE, CALCIUM, PROT, ALBUMIN, AST, ALT, ALKPHOS, BILITOT, GFRNONAA, GFRAA  No results found for: TOTALPROTELP, ALBUMINELP, A1GS, A2GS, BETS, BETA2SER, GAMS, MSPIKE, SPEI  No results found for: KPAFRELGTCHN, LAMBDASER, KAPLAMBRATIO  No results found for: WBC, NEUTROABS, HGB, HCT, MCV, PLT  No results found for: LABCA2  No components found for: IZTIWP809  No results for input(s): INR in the last 168 hours.  No results found for: LABCA2  No results found for: XIP382  No results found for: NKN397  No results found for: QBH419  No results found for: CA2729  No components found for: HGQUANT  No results found for: CEA1 / No results found for: CEA1   No results found for: AFPTUMOR  No results found for: CHROMOGRNA  No results found for: TOTALPROTELP, ALBUMINELP, A1GS, A2GS, BETS, BETA2SER, GAMS, MSPIKE, SPEI (this displays SPEP labs)  No results found for: KPAFRELGTCHN, LAMBDASER, KAPLAMBRATIO (kappa/lambda light chains)  No results found for: HGBA, HGBA2QUANT, HGBFQUANT, HGBSQUAN (Hemoglobinopathy evaluation)   No results found for: LDH  No results found for: IRON, TIBC, IRONPCTSAT (Iron and TIBC)  No results found for: FERRITIN  Urinalysis No results found for: COLORURINE, APPEARANCEUR, LABSPEC, PHURINE, GLUCOSEU, HGBUR, BILIRUBINUR, KETONESUR, PROTEINUR, UROBILINOGEN, NITRITE, LEUKOCYTESUR   STUDIES:  DG Foot 2 Views Right  Result Date: 07/24/2020 Please see detailed radiograph report in office note.    ELIGIBLE FOR AVAILABLE RESEARCH PROTOCOL: no   ASSESSMENT: 60 y.o. [place], [place] woman status post left breast biopsy 03/20/2018 and 11/20/2018 showing atypical lobular hyperplasia  (1) status post left lumpectomy 01/17/2019 showing lobular carcinoma in situ  (2) breast cancer high risk (30 to 50% lifetime risk)  (a) risk reduction: Tamoxifen started 02/22/2019  (b) intensified  screening:   Breast MRI yearly alternating with    Breast Mammography yearly  (3) status post repeat left lumpectomy 07/17/2019 again showing atypical lobular hyperplasia   PLAN: Mariah Haley is now a year and a half into her 5 years of tamoxifen with excellent tolerance.  The plan will be to continue that a total of 5 years.  As frequently happens the mammogram and MRI got off schedule because of coding issues and other concerns.  I have given Myriam Jacobson my nurses number so that if she runs into similar problems in the future we can troubleshoot for her.  She will have a repeat MRI in November and then next year she will have mammography in May.  I will have a virtual visit with her after her November MRI to discuss results.  She does know to call us for any other issue that may develop before the next visit  Total encounter time 20 minutes.Sarajane Jews C. Eryc Bodey, MD 08/05/20 9:34 AM Medical Oncology and Hematology [facility] [place],  [address] Tel. [phone]    Fax. [phone]    I, Wilburn Mylar, am acting as scribe for Dr. Virgie Dad. Elaf Clauson.  Lindie Spruce MD,  have reviewed the above documentation for accuracy and completeness, and I agree with the above.   *Total Encounter Time as defined by the Centers for Medicare and Medicaid Services includes, in addition to the face-to-face time of a patient visit (documented in the note above) non-face-to-face time: obtaining and reviewing outside history, ordering and reviewing medications, tests or procedures, care coordination (communications with other health care professionals or caregivers) and documentation in the medical record.

## 2020-08-05 ENCOUNTER — Other Ambulatory Visit: Payer: Self-pay

## 2020-08-05 ENCOUNTER — Inpatient Hospital Stay: Payer: BC Managed Care – PPO | Attending: Oncology | Admitting: Oncology

## 2020-08-05 VITALS — BP 154/85 | HR 80 | Temp 97.5°F | Resp 19 | Ht 60.0 in | Wt 171.1 lb

## 2020-08-05 DIAGNOSIS — R232 Flushing: Secondary | ICD-10-CM | POA: Insufficient documentation

## 2020-08-05 DIAGNOSIS — Z170 Estrogen receptor positive status [ER+]: Secondary | ICD-10-CM | POA: Diagnosis not present

## 2020-08-05 DIAGNOSIS — Z79810 Long term (current) use of selective estrogen receptor modulators (SERMs): Secondary | ICD-10-CM | POA: Insufficient documentation

## 2020-08-05 DIAGNOSIS — N6092 Unspecified benign mammary dysplasia of left breast: Secondary | ICD-10-CM

## 2020-08-05 DIAGNOSIS — E7800 Pure hypercholesterolemia, unspecified: Secondary | ICD-10-CM | POA: Diagnosis not present

## 2020-08-05 DIAGNOSIS — Z1239 Encounter for other screening for malignant neoplasm of breast: Secondary | ICD-10-CM

## 2020-08-05 DIAGNOSIS — Z79899 Other long term (current) drug therapy: Secondary | ICD-10-CM | POA: Diagnosis not present

## 2020-08-05 DIAGNOSIS — C50212 Malignant neoplasm of upper-inner quadrant of left female breast: Secondary | ICD-10-CM | POA: Diagnosis not present

## 2020-08-05 MED ORDER — TAMOXIFEN CITRATE 20 MG PO TABS: 20.0000 mg | ORAL_TABLET | Freq: Every day | ORAL | 4 refills | Status: DC

## 2020-08-06 ENCOUNTER — Telehealth: Payer: Self-pay | Admitting: Oncology

## 2020-08-06 NOTE — Telephone Encounter (Signed)
Scheduled appointment per 3/15 los. Spoke to patient who is aware of appointment date and time.

## 2020-08-18 ENCOUNTER — Other Ambulatory Visit: Payer: BC Managed Care – PPO

## 2020-08-26 ENCOUNTER — Ambulatory Visit (INDEPENDENT_AMBULATORY_CARE_PROVIDER_SITE_OTHER): Payer: BC Managed Care – PPO | Admitting: Podiatry

## 2020-08-26 ENCOUNTER — Other Ambulatory Visit: Payer: Self-pay

## 2020-08-26 DIAGNOSIS — M779 Enthesopathy, unspecified: Secondary | ICD-10-CM

## 2020-08-26 DIAGNOSIS — M21611 Bunion of right foot: Secondary | ICD-10-CM

## 2020-08-26 DIAGNOSIS — M21612 Bunion of left foot: Secondary | ICD-10-CM

## 2020-08-26 DIAGNOSIS — M216X9 Other acquired deformities of unspecified foot: Secondary | ICD-10-CM

## 2020-08-26 NOTE — Progress Notes (Signed)
Patient presents today for orthotic pick up. Patient voices no new complaints.  Orthotics were fitted to patient's feet. No discomfort and no rubbing. Patient satisfied with the orthotics.  Orthotics were dispensed to patient with instructions for break in wear and to call the office with any concerns or questions. 

## 2020-08-26 NOTE — Patient Instructions (Signed)

## 2021-03-24 ENCOUNTER — Other Ambulatory Visit: Payer: Self-pay

## 2021-03-24 ENCOUNTER — Ambulatory Visit
Admission: RE | Admit: 2021-03-24 | Discharge: 2021-03-24 | Disposition: A | Discharge status: Home / Self Care | Payer: BC Managed Care – PPO | Source: Ambulatory Visit | Attending: Oncology | Admitting: Oncology

## 2021-03-24 DIAGNOSIS — N6092 Unspecified benign mammary dysplasia of left breast: Secondary | ICD-10-CM

## 2021-03-24 DIAGNOSIS — Z1239 Encounter for other screening for malignant neoplasm of breast: Secondary | ICD-10-CM

## 2021-03-24 IMAGING — MR MR BREAST BILAT WO/W CM
8 of 12 series · 32 of 48 positions shown · IV contrast (7 GADAVIST)
Comparison: Previous exams including bilateral diagnostic mammogram
dated [DATE], breast MRI dated [DATE],and breast MRI dated
[DATE].

CLINICAL DATA: Breast cancer screening, high risk, greater than 20%
lifetime risk.
TECHNIQUE: Multiplanar, multisequence MR images of both breasts were obtained
prior to and following the intravenous administration of 7 ml of
Gadavist

[Series 2: t2_tirm_tra ipat (a-p) · axial · 3.0mm · 0.70mm/px · 1 of 55 slices shown]
[im 1/55]
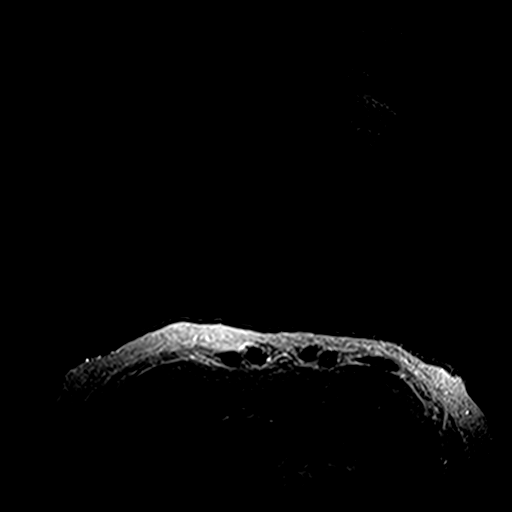

[Series 3: fl3d pre-cm no · axial · non-contrast · 1.2mm · 0.94mm/px · z∈[-62,+110]mm · 5 of 144 slices shown]
[im 1/144]
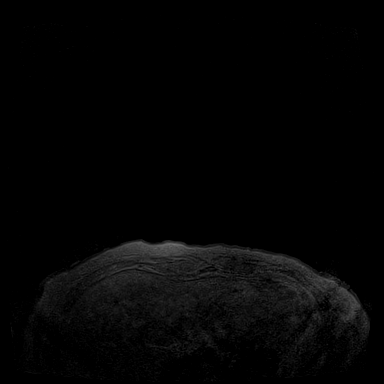
[im 36/144]
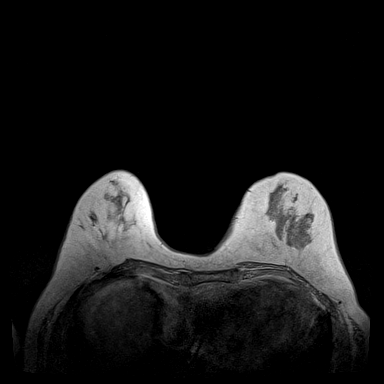
[im 72/144]
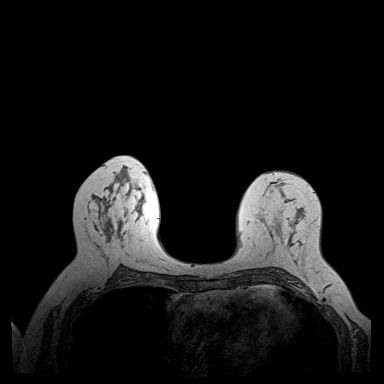
[im 108/144]
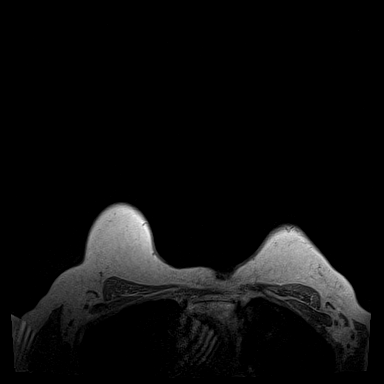
[im 144/144]
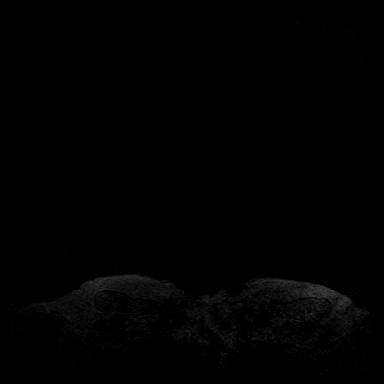

[Series 4: fl3d pre-cm · axial · non-contrast · 1.2mm · 0.94mm/px · z∈[-62,+110]mm · 5 of 144 slices shown]
[im 1/144]
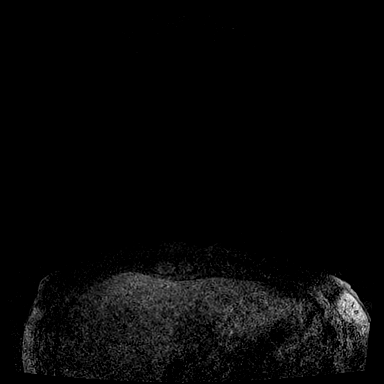
[im 36/144]
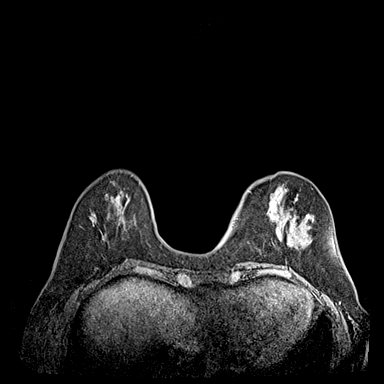
[im 72/144]
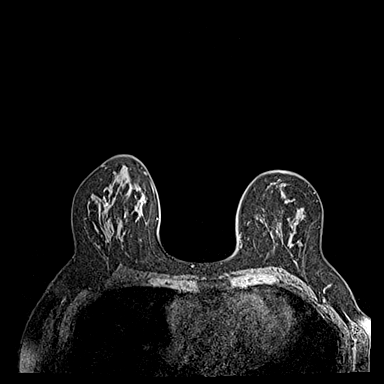
[im 108/144]
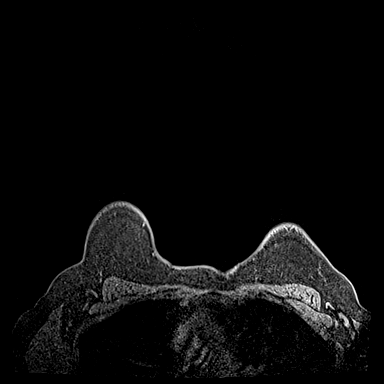
[im 144/144]
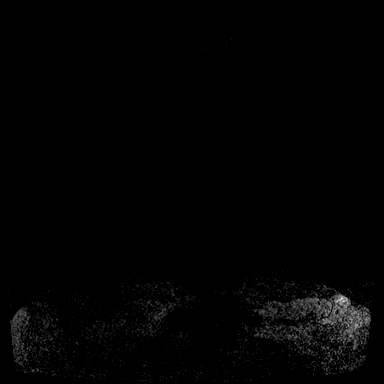

[Series 5: fl3d post-cm 20 · axial · 1.2mm · 0.94mm/px · z∈[-62,+110]mm · 5 of 144 slices shown (1 of 3)]
[im 1/144]
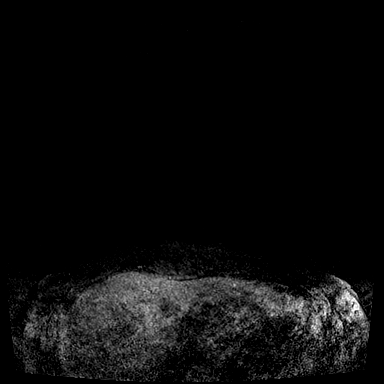
[im 36/144]
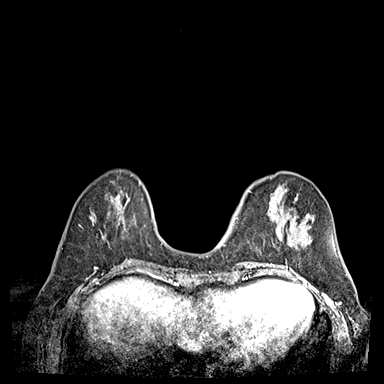
[im 72/144]
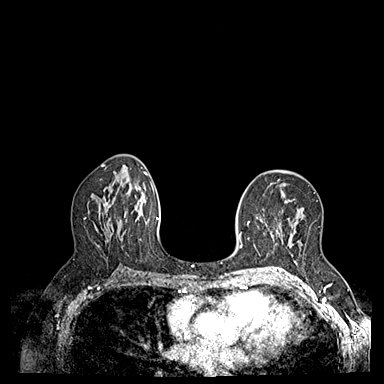
[im 108/144]
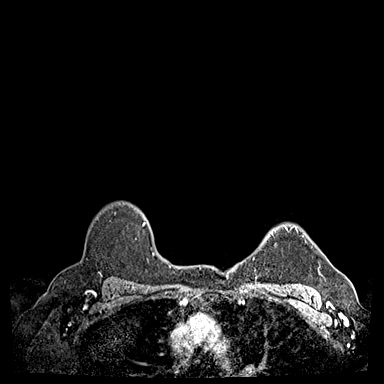
[im 144/144]
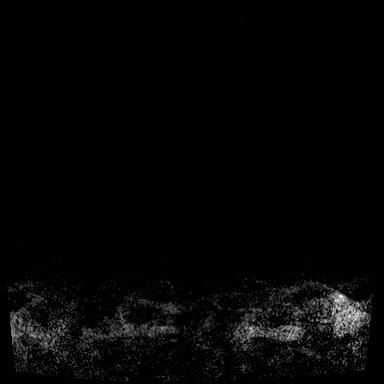

[Series 6: fl3d post-cm 20 · axial · 1.2mm · 0.94mm/px · z∈[-62,+110]mm · 5 of 144 slices shown (2 of 3)]
[im 1/144]
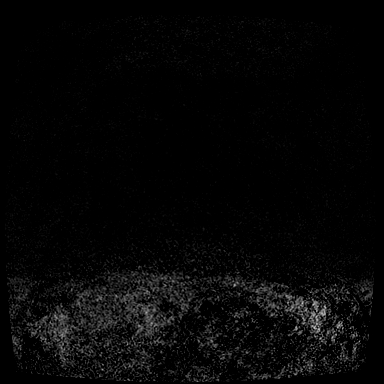
[im 36/144]
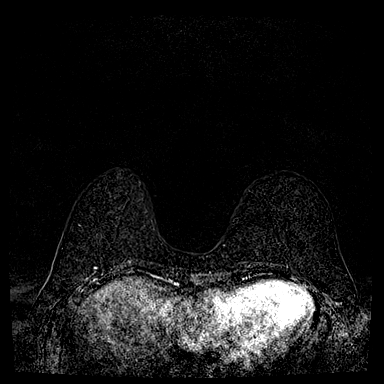
[im 72/144]
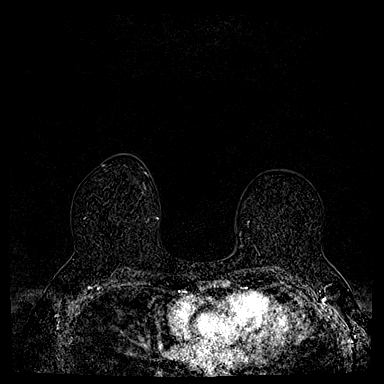
[im 108/144]
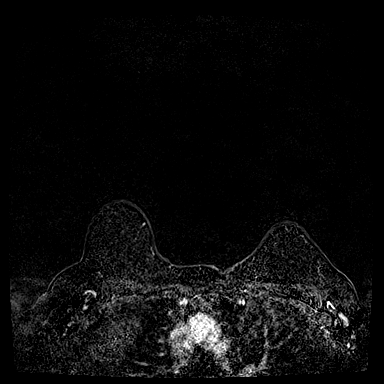
[im 144/144]
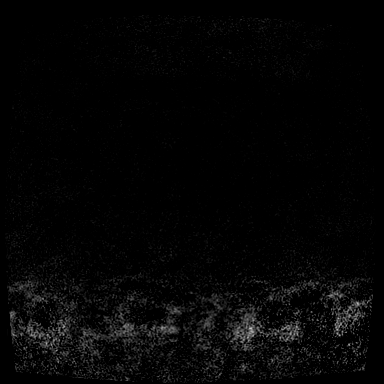

[Series 7: fl3d post-cm 20 · axial · 172.8mm · 0.94mm/px · 1 of 1 slices shown (3 of 3)]
[im 1/1]
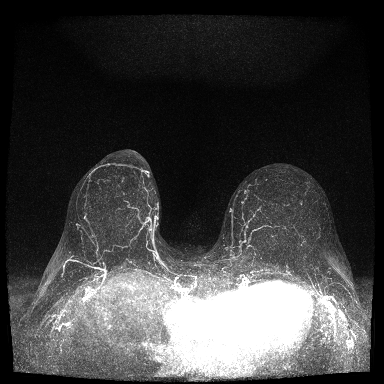

[Series 8: fl3d post-cm 3 · axial · 1.2mm · 0.94mm/px · z∈[-62,+110]mm · 6 of 144 slices shown (1 of 2)]
[im 1/144]
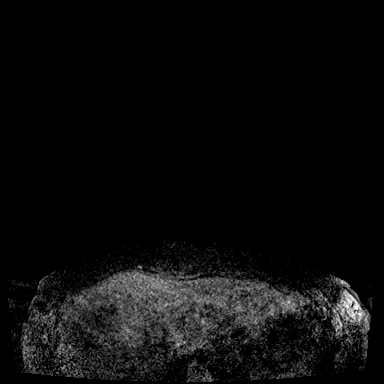
[im 29/144]
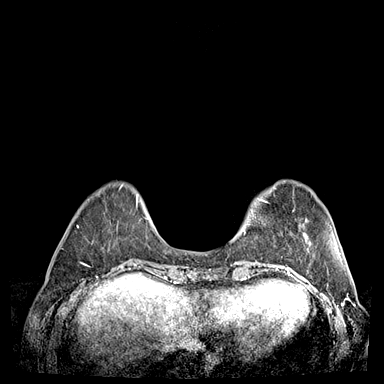
[im 58/144]
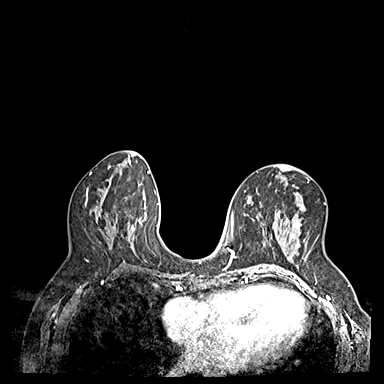
[im 86/144]
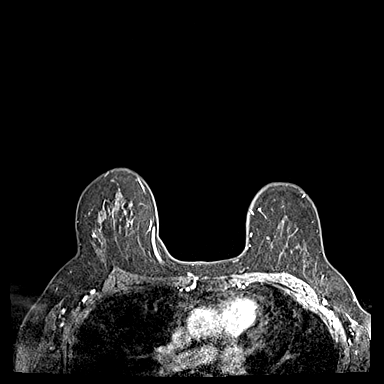
[im 115/144]
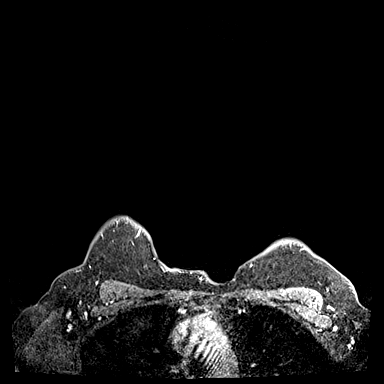
[im 144/144]
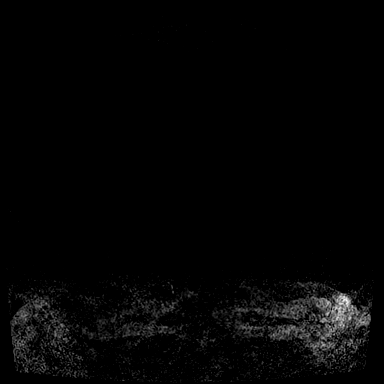

[Series 9: fl3d post-cm 3 · axial · 1.2mm · 0.94mm/px · z∈[-62,+40]mm · 4 of 144 slices shown (2 of 2)]
[im 1/144]
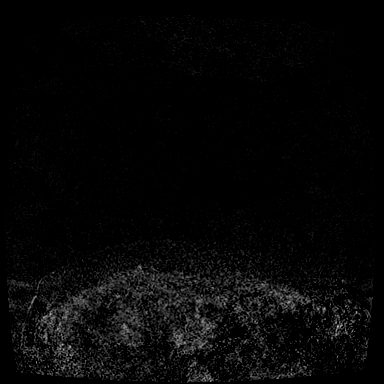
[im 29/144]
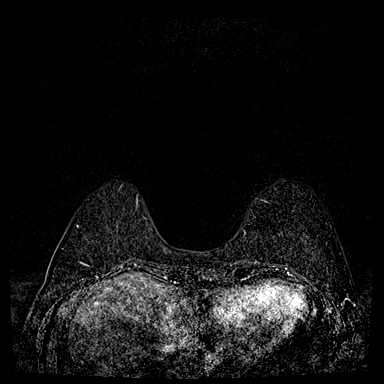
[im 58/144]
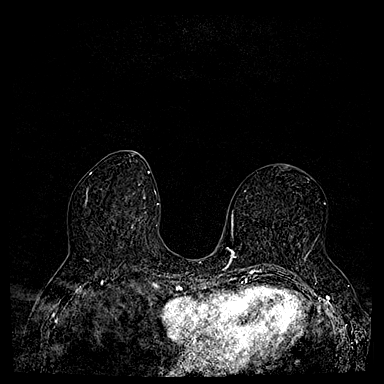
[im 86/144]
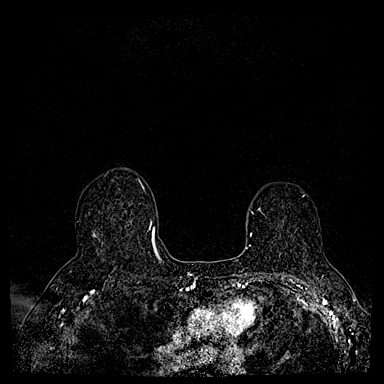

[32 of 48 positions shown; findings below may reference images not displayed]

History of LEFT breast atypical lobular hyperplasia (upper inner
quadrant at posterior depth) by MRI-guided biopsy on [DATE]
(barbell clip).

Patient has history of other previous benign breast biopsies and 2
LEFT breast surgical excisions. This includes a LEFT breast biopsy
in [DATE] that also revealed a lobular neoplasia which was
subsequently excised and a LEFT breast lobular neoplasia in [DATE] that was excised.

LABS:  Not performed at imaging site.

EXAM:
BILATERAL BREAST MRI WITH AND WITHOUT CONTRAST
Three-dimensional MR images were rendered by post-processing of the
original MR data on an independent workstation. The
three-dimensional MR images were interpreted, and findings are
reported in the following complete MRI report for this study. Three
dimensional images were evaluated at the independent interpreting
workstation using the DynaCAD thin client.
FINDINGS: Breast composition: c. Heterogeneous fibroglandular tissue.

Background parenchymal enhancement: Mild

Right breast: No suspicious enhancing mass, suspicious non-mass
enhancement or secondary signs of malignancy in the RIGHT breast.

Left breast: No suspicious enhancing mass, suspicious non-mass
enhancement or secondary signs of malignancy in the LEFT breast.

Biopsy clip artifact within the upper inner quadrant of the LEFT
breast, corresponding to the most recent MRI guided biopsy on
[DATE] revealing atypical lobular hyperplasia and fat necrosis.
There is no abnormal enhancement at this biopsy site.

Additional biopsy clip artifact within the lower inner quadrant of
the LEFT breast, corresponding to the coil shaped clip seen on
mammogram.

Lymph nodes: No abnormal appearing lymph nodes.

Ancillary findings:  None.
IMPRESSION: 1. No evidence of malignancy within either breast.
2. Biopsy clip artifact within the upper inner quadrant of the LEFT
breast, corresponding to most recent MRI-guided biopsy on [DATE]
revealing atypical lobular hyperplasia and fat necrosis. No abnormal
enhancement at this biopsy site.

RECOMMENDATION:
1. Annual screening mammograms. Next bilateral screening mammogram
will be due in [DATE].
2. Annual screening breast MRIs.

BI-RADS CATEGORY  2: Benign.

## 2021-03-24 MED ORDER — GADOBUTROL 1 MMOL/ML IV SOLN
7.0000 mL | Freq: Once | INTRAVENOUS | Status: AC | PRN
  Administered 2021-03-24: 7 mL via INTRAVENOUS

## 2021-04-05 NOTE — Progress Notes (Signed)
[facility]  Telephone:([phone]) [phone] Fax:(336) [phone]    ID: Mariah Haley DOB: [birth year]  MR#: [id number]  KVQ#:[id number]  Patient Care Team: Fortino Sic, PA as PCP - General (Family Medicine) Vinie Charity, Virgie Dad, MD as Consulting Physician (Oncology) Rolm Bookbinder, MD as Consulting Physician (General Surgery) Hale Bogus., MD as Referring Physician (Gastroenterology) Jonathon Resides, MD as Consulting Physician (Family Medicine) OTHER MD:  I connected with [name] on 04/05/21 at 10:30 AM EST by video enabled telemedicine visit and verified that I am speaking with the correct person using two identifiers.   I discussed the limitations, risks, security and privacy concerns of performing an evaluation and management service by telemedicine and the availability of in-person appointments. I also discussed with the patient that there may be a patient responsible charge related to this service. The patient expressed understanding and agreed to proceed.   Other persons participating in the visit and their role in the encounter: None  Patient's location: Home Provider's location: [place]   I provided 15 minutes of face-to-face video visit time during this encounter, and > 50% was spent counseling as documented under my assessment & plan.   CHIEF COMPLAINT: Lobular carcinoma in situ; breast cancer high risk  CURRENT TREATMENT: Tamoxifen; intensified screening   INTERVAL HISTORY: Mariah Haley was contacted today for follow up of her LCIS and ALH.  She continues on tamoxifen.  Hot flashes are minimal and she has no problems with vaginal wetness.  Overall she is tolerating this well.  Since her last visit, she underwent breast MRI on 03/24/2021 showing: breast composition C; no evidence of malignancy within either breast.   REVIEW OF SYSTEMS: Mariah Haley has been very involved with her mother's decline (she has Alzheimer's).   Her mother is now at [facility] which she is helpful.  [name] is walking about an hour and a half a day, usually about 4 miles, and has lost she tells me 40 pounds as a result.  She is very happy with this.  She is planning to have a hysterectomy and bladder sling in January.  A detailed review of systems today was otherwise stable   COVID 19 VACCINATION STATUS: Status post Moderna x5   HISTORY OF CURRENT ILLNESS: From the original intake note:  "Mariah Haley" [name] had routine screening mammography on 03/07/2018 showing a possible abnormality in the left breast. She underwent unilateral left diagnostic mammography with tomography and left breast ultrasonography at [facility] on 03/14/2018 showing: Breast Density Category C. Additional mammographic views of the left breast demonstrate no reproducible distortion in the medial left breast, posterior depth. There is however an asymmetry with a few associated microcalcifications in the medial left breast, posterior depth, seen on both spot compression craniocaudal tomosynthesis views, image 41/69 and image 37/64. On physical exam, no suspicious masses are palpated. Targeted ultrasound is performed, showing dense breast parenchyma, but no sonographically identifiable mass. There is no evidence of left axillary lymphadenopathy.  Accordingly on 03/20/2018 she proceeded to biopsy of the left breast area in question. The pathology from this procedure showed [id number]):  Breast, left, needle core biopsy, medial - lobular neoplasia (atypical lobular hyperplasia). - fibrocystic changes with calcifications.   - pseudoangiomatous stromal hyperplasia (pash).  She underwent repeat unilateral left diagnostic mammography with tomography at [facility] on 11/10/2018 showing: Breast Density Category C. Indeterminate left breast asymmetry as originally identified on the patient's screening and diagnostic workup. A biopsy clip  at the site of  patient's known atypical lobular hyperplasia is approximately 2 cm medial to this area.  On 11/20/2018 she proceeded to biopsy of the left breast area in question. The pathology from this procedure showed ([id number]): Breast, left, needle core biopsy, uiq, posterior - lobular neoplasia (atypical lobular hyperplasia). - fibrocystic changes with calcifications.   - pseudoangiomatous stromal hyperplasia (pash).  Then, on 01/17/2019, she underwent a left lumpectomy. The pathology from this procedure showed ([id number]):  Breast, lumpectomy, Left w/seed   - lobular carcinoma in situ  The patient's subsequent history is as detailed below.   PAST MEDICAL HISTORY: Past Medical History:  Diagnosis Date   Gilbert's syndrome    Hypercholesterolemia      PAST SURGICAL HISTORY: Past Surgical History:  Procedure Laterality Date   BREAST LUMPECTOMY Left 01/17/2019   ALH & PASH (LCIS)   DILATION AND CURETTAGE OF UTERUS     EYE SURGERY     "as a child"   RADIOACTIVE SEED GUIDED EXCISIONAL BREAST BIOPSY Left 01/17/2019   Procedure: RADIOACTIVE SEED GUIDED LEFT BREAST LUMPECTOMY;  Surgeon: Rolm Bookbinder, MD;  Location: [facility];  Service: General;  Laterality: Left;   RADIOACTIVE SEED GUIDED EXCISIONAL BREAST BIOPSY Left 07/17/2019   Procedure: RADIOACTIVE SEED GUIDED EXCISIONAL LEFT BREAST BIOPSY;  Surgeon: Rolm Bookbinder, MD;  Location: [place];  Service: General;  Laterality: Left;    FAMILY HISTORY: No family history on file. Mariah Haley's father died at the age of 63 from heart problems (first heart attack age 47). Patients' mother is 77 years old as of September 2020. The patient has 1 brothers and 2 sisters. Patient denies anyone in her family having breast, ovarian, prostate, or pancreatic cancer.  There are some lung cancers in the family, all of them smokers   GYNECOLOGIC HISTORY:  Patient's last menstrual period was 02/10/2011. 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  Her husband Chalmer "Shanon Brow" is a former Arboriculturist, now retired.  Daughter Hulen Skains, 21, is a Licensed conveyancer at [employer] in [place]; daughter Melvern Banker is an Armed forces technical officer in [place]; Dr. Barnetta Chapel is getting it certified social work degree in [place].  No grandchildren.  The patient is a [employer]   ADVANCED DIRECTIVES: In the absence of documents to the contrary the patient's husband is her healthcare power of attorney   HEALTH MAINTENANCE: Social History   Tobacco Use   Smoking status: Never   Smokeless tobacco: Never  Substance Use Topics   Alcohol use: Yes    Comment: rare   Drug use: Never    Colonoscopy: 2019/[facility]  PAP: 2018/ [facility]  Bone density: no   No Known Allergies  Current Outpatient Medications  Medication Sig Dispense Refill   diclofenac (VOLTAREN) 75 MG EC tablet Take 1 tablet (75 mg total) by mouth 2 (two) times daily. 50 tablet 2   tamoxifen (NOLVADEX) 20 MG tablet Take 1 tablet (20 mg total) by mouth daily. 90 tablet 4   valACYclovir (VALTREX) 500 MG tablet      No current facility-administered medications for this visit.    OBJECTIVE: White woman who appears well  There were no vitals filed for this visit.  Wt Readings from  Last 3 Encounters:  08/05/20 171 lb 1.6 oz (77.6 kg)  11/27/19 195 lb (88.5 kg)  07/17/19 191 lb 12.8 oz (87 kg)   There is no height or weight on file to calculate BMI.    ECOG FS:1 - Symptomatic but completely ambulatory  Telemedicine visit 04/06/2021   LAB RESULTS:  CMP  No results found for: NA, K, CL, CO2, GLUCOSE, BUN, CREATININE, CALCIUM, PROT, ALBUMIN, AST, ALT, ALKPHOS, BILITOT, GFRNONAA, GFRAA  No results found for:  TOTALPROTELP, ALBUMINELP, A1GS, A2GS, BETS, BETA2SER, GAMS, MSPIKE, SPEI  No results found for: KPAFRELGTCHN, LAMBDASER, KAPLAMBRATIO  No results found for: WBC, NEUTROABS, HGB, HCT, MCV, PLT  No results found for: LABCA2  No components found for: MIWOEH212  No results for input(s): INR in the last 168 hours.  No results found for: LABCA2  No results found for: YQM250  No results found for: IBB048  No results found for: GQB169  No results found for: CA2729  No components found for: HGQUANT  No results found for: CEA1 / No results found for: CEA1   No results found for: AFPTUMOR  No results found for: CHROMOGRNA  No results found for: TOTALPROTELP, ALBUMINELP, A1GS, A2GS, BETS, BETA2SER, GAMS, MSPIKE, SPEI (this displays SPEP labs)  No results found for: KPAFRELGTCHN, LAMBDASER, KAPLAMBRATIO (kappa/lambda light chains)  No results found for: HGBA, HGBA2QUANT, HGBFQUANT, HGBSQUAN (Hemoglobinopathy evaluation)   No results found for: LDH  No results found for: IRON, TIBC, IRONPCTSAT (Iron and TIBC)  No results found for: FERRITIN  Urinalysis No results found for: COLORURINE, APPEARANCEUR, LABSPEC, PHURINE, GLUCOSEU, HGBUR, BILIRUBINUR, KETONESUR, PROTEINUR, UROBILINOGEN, NITRITE, LEUKOCYTESUR   STUDIES:  MR BREAST BILATERAL W WO CONTRAST INC CAD  Result Date: 03/24/2021 CLINICAL DATA:  Breast cancer screening, high risk, greater than 20% lifetime risk. History of LEFT breast atypical lobular hyperplasia (upper inner quadrant at posterior depth) by MRI-guided biopsy on 05/06/2020 (barbell clip). Patient has history of other previous benign breast biopsies and 2 LEFT breast surgical excisions. This includes a LEFT breast biopsy in June of 2020 that also revealed a lobular neoplasia which was subsequently excised and a LEFT breast lobular neoplasia in December of 2020 that was excised. LABS:  Not performed at imaging site. EXAM: BILATERAL BREAST MRI WITH AND WITHOUT  CONTRAST TECHNIQUE: Multiplanar, multisequence MR images of both breasts were obtained prior to and following the intravenous administration of 7 ml of Gadavist Three-dimensional MR images were rendered by post-processing of the original MR data on an independent workstation. The three-dimensional MR images were interpreted, and findings are reported in the following complete MRI report for this study. Three dimensional images were evaluated at the independent interpreting workstation using the DynaCAD thin client. COMPARISON:  Previous exams including bilateral diagnostic mammogram dated 05/27/2020, breast MRI dated 03/29/2020,and breast MRI dated 04/18/2019. FINDINGS: Breast composition: c. Heterogeneous fibroglandular tissue. Background parenchymal enhancement: Mild Right breast: No suspicious enhancing mass, suspicious non-mass enhancement or secondary signs of malignancy in the RIGHT breast. Left breast: No suspicious enhancing mass, suspicious non-mass enhancement or secondary signs of malignancy in the LEFT breast. Biopsy clip artifact within the upper inner quadrant of the LEFT breast, corresponding to the most recent MRI guided biopsy on 05/06/2020 revealing atypical lobular hyperplasia and fat necrosis. There is no abnormal enhancement at this biopsy site. Additional biopsy clip artifact within the lower inner quadrant of the LEFT breast, corresponding to the coil shaped clip seen on mammogram. Lymph nodes: No abnormal appearing lymph nodes. Ancillary findings:  None.  IMPRESSION: 1. No evidence of malignancy within either breast. 2. Biopsy clip artifact within the upper inner quadrant of the LEFT breast, corresponding to most recent MRI-guided biopsy on 05/06/2020 revealing atypical lobular hyperplasia and fat necrosis. No abnormal enhancement at this biopsy site. RECOMMENDATION: 1. Annual screening mammograms. Next bilateral screening mammogram will be due in January of 2023. 2. Annual screening breast  MRIs. BI-RADS CATEGORY  2: Benign. Electronically Signed   By: Franki Cabot M.D.   On: 03/24/2021 13:31     ELIGIBLE FOR AVAILABLE RESEARCH PROTOCOL: no   ASSESSMENT: 60 y.o. [place], [place] woman status post left breast biopsy 03/20/2018 and 11/20/2018 showing atypical lobular hyperplasia  (1) status post left lumpectomy 01/17/2019 showing lobular carcinoma in situ  (2) breast cancer high risk (30 to 50% lifetime risk)  (a) risk reduction: Tamoxifen started 02/22/2019  (b) intensified screening:   Breast MRI yearly alternating with    Breast Mammography yearly  (3) status post repeat left lumpectomy 07/17/2019 again showing atypical lobular hyperplasia   PLAN: Mariah Haley is coming up on 2 years since the start of tamoxifen.  She is tolerating this well.  The plan is for total of 5 years.  Her high risk screening is a bit off.  She should have had her MRI in the summer and mammogram in January.  Her MRI was delayed until November so her mammogram needs to be put back on till May.  I am arranging after that to be done.  She will then have a repeat breast MRI early in November 2023 and we will see her later that same month.  I commended her exercise and weight loss program.  She knows to call for any other issue that may develop before the next visit  Total encounter time 15 minutes   Sarajane Jews C. Nevena Rozenberg, MD 04/05/21 9:44 AM Medical Oncology and Hematology [facility] [place], [place] [address] Tel. [phone]    Fax. [phone]    I, Wilburn Mylar, am acting as scribe for Dr. Virgie Dad. Mabry Santarelli.  I, Lurline Del MD, have reviewed the above documentation for accuracy and completeness, and I agree with the above.   *Total Encounter Time as defined by the Centers for Medicare and Medicaid Services includes, in addition to the face-to-face time of a patient visit (documented in the note above) non-face-to-face time: obtaining and reviewing  outside history, ordering and reviewing medications, tests or procedures, care coordination (communications with other health care professionals or caregivers) and documentation in the medical record.

## 2021-04-06 ENCOUNTER — Inpatient Hospital Stay: Payer: BC Managed Care – PPO | Attending: Oncology | Admitting: Oncology

## 2021-04-06 DIAGNOSIS — Z1239 Encounter for other screening for malignant neoplasm of breast: Secondary | ICD-10-CM | POA: Diagnosis not present

## 2021-04-06 DIAGNOSIS — N6092 Unspecified benign mammary dysplasia of left breast: Secondary | ICD-10-CM

## 2021-07-29 ENCOUNTER — Other Ambulatory Visit: Payer: Self-pay | Admitting: *Deleted

## 2021-07-29 DIAGNOSIS — N6092 Unspecified benign mammary dysplasia of left breast: Secondary | ICD-10-CM

## 2021-07-29 DIAGNOSIS — Z1239 Encounter for other screening for malignant neoplasm of breast: Secondary | ICD-10-CM

## 2021-09-23 ENCOUNTER — Ambulatory Visit: Payer: BC Managed Care – PPO

## 2021-10-13 ENCOUNTER — Telehealth: Payer: Self-pay

## 2021-10-13 ENCOUNTER — Other Ambulatory Visit: Payer: Self-pay | Admitting: Hematology and Oncology

## 2021-10-13 MED ORDER — TAMOXIFEN CITRATE 20 MG PO TABS: 20.0000 mg | ORAL_TABLET | Freq: Every day | ORAL | 0 refills | Status: DC

## 2021-10-13 NOTE — Telephone Encounter (Signed)
Patient called and informed that Tamoxifen prescription had been refilled for one month supply by Dr. Chryl Heck and she will be scheduled for follow-up visit within the next month. Patient appreciative of call and will await call from our schedulers.

## 2021-10-13 NOTE — Progress Notes (Signed)
30 day prescription sent for tamoxifen Patient will need a follow up before refills  .sig

## 2021-10-15 ENCOUNTER — Other Ambulatory Visit: Payer: Self-pay

## 2021-10-15 MED ORDER — TAMOXIFEN CITRATE 20 MG PO TABS: 20.0000 mg | ORAL_TABLET | Freq: Every day | ORAL | 2 refills | Status: DC

## 2021-10-21 ENCOUNTER — Ambulatory Visit
Admission: RE | Admit: 2021-10-21 | Discharge: 2021-10-21 | Disposition: A | Discharge status: Home / Self Care | Payer: BC Managed Care – PPO | Source: Ambulatory Visit | Attending: Hematology and Oncology | Admitting: Hematology and Oncology

## 2021-10-21 ENCOUNTER — Ambulatory Visit: Payer: BC Managed Care – PPO

## 2021-10-21 DIAGNOSIS — N6092 Unspecified benign mammary dysplasia of left breast: Secondary | ICD-10-CM

## 2021-10-21 DIAGNOSIS — Z1239 Encounter for other screening for malignant neoplasm of breast: Secondary | ICD-10-CM

## 2021-10-21 IMAGING — MG DIGITAL DIAGNOSTIC BILAT W/ TOMO W/ CAD
8 series · 8 of 24 positions shown · non-contrast
Comparison: Previous exam(s).

CLINICAL DATA: Multiple prior core needle biopsies and 2 previous
left breast excisions. Previous history of left breast atypical
lobular hyperplasia.

EXAM:
DIGITAL DIAGNOSTIC BILATERAL MAMMOGRAM WITH TOMOSYNTHESIS AND CAD
TECHNIQUE: Bilateral digital diagnostic mammography and breast tomosynthesis
was performed. The images were evaluated with computer-aided
detection.

[R MLO synth-2D]
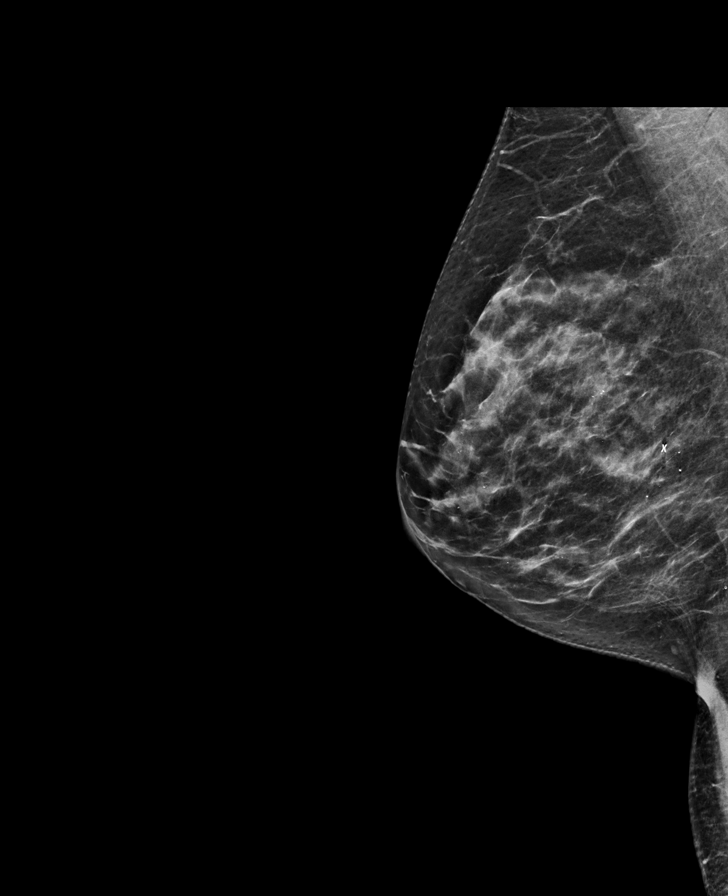

[L CC synth-2D]
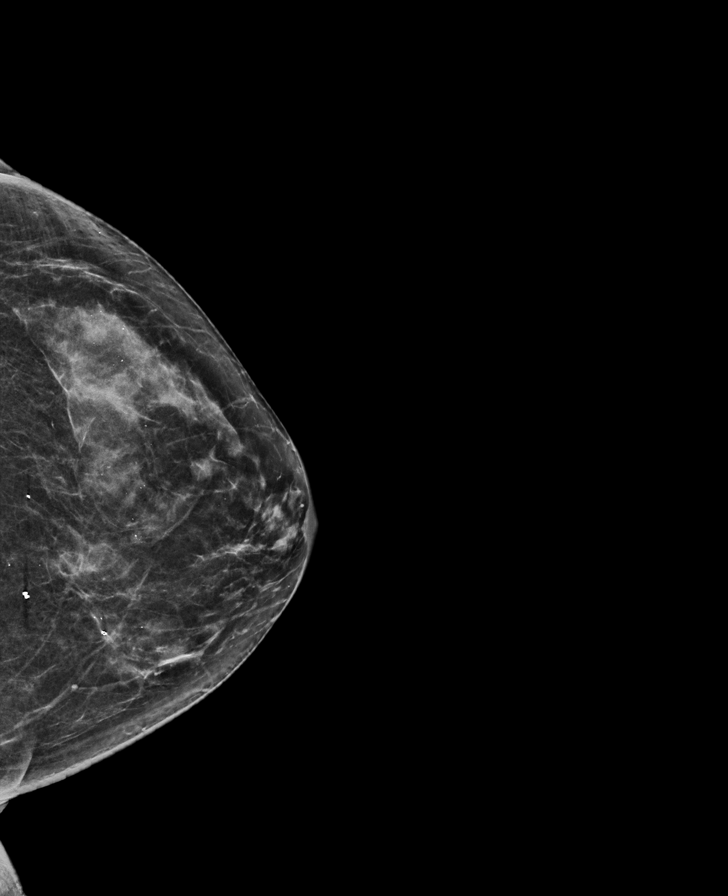

[L MLO synth-2D]
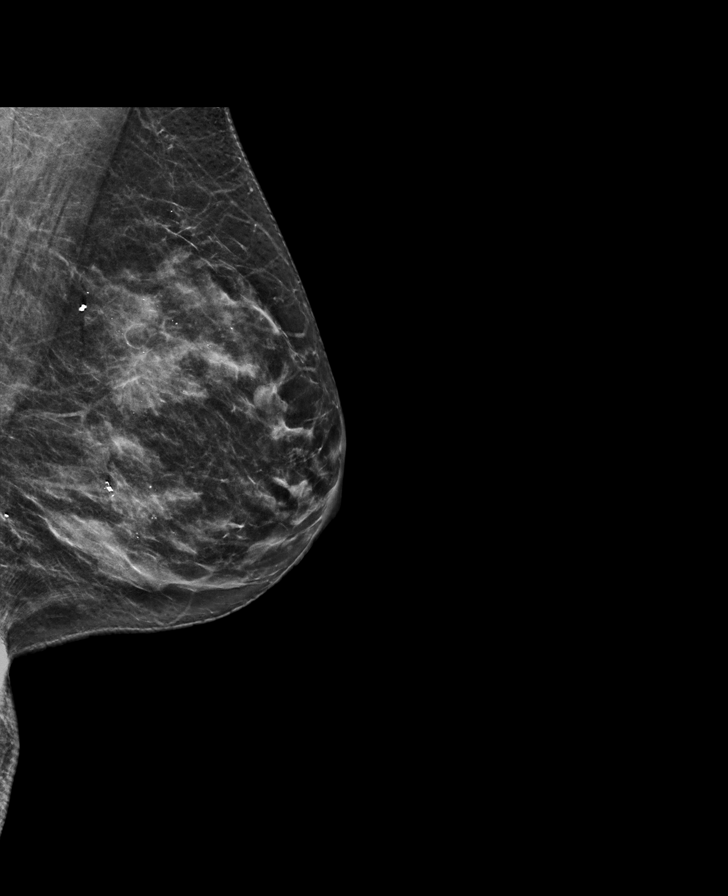

[R CC synth-2D]
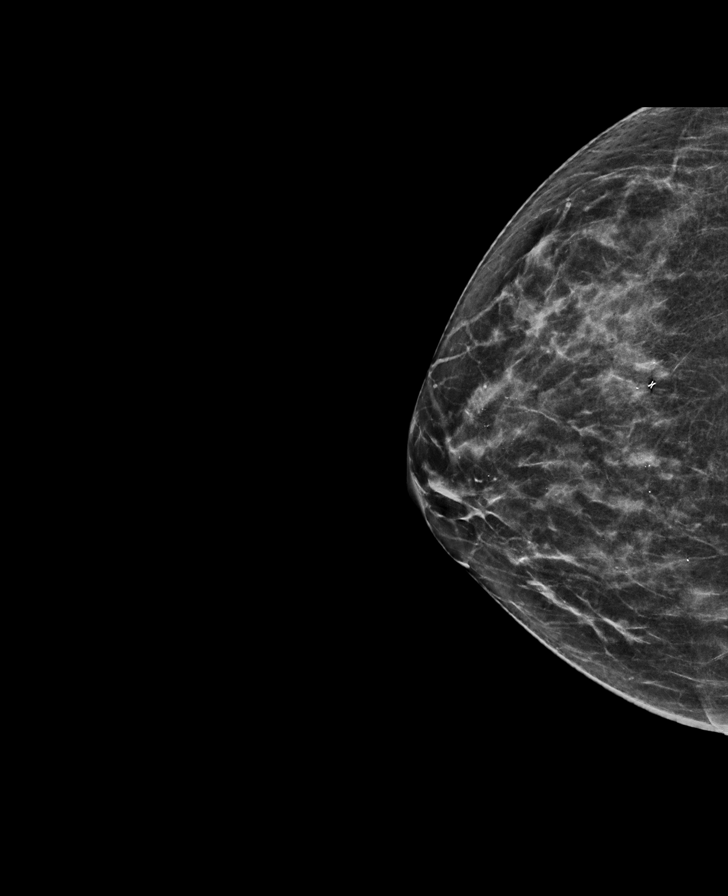

[L MLO tomo · tomo slice 31/61.0]
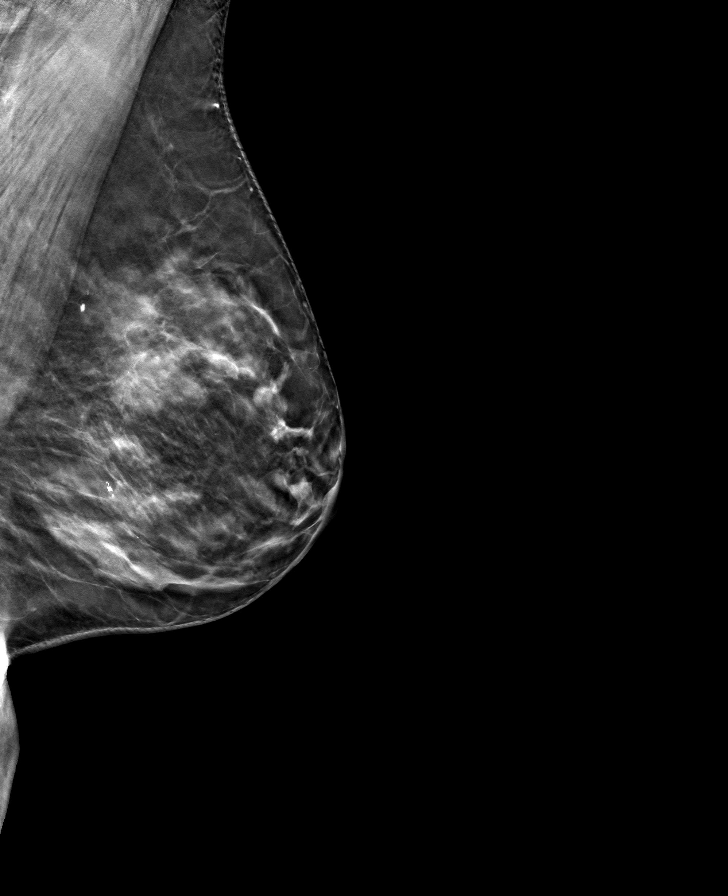

[R CC tomo · tomo slice 31/60.0]
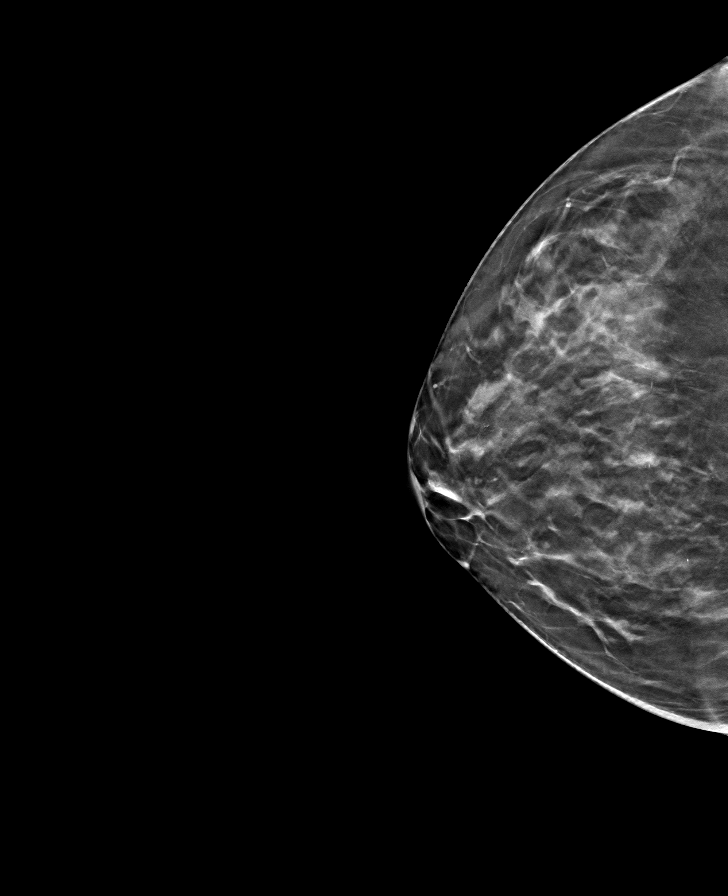

[R MLO tomo · tomo slice 33/64.0]
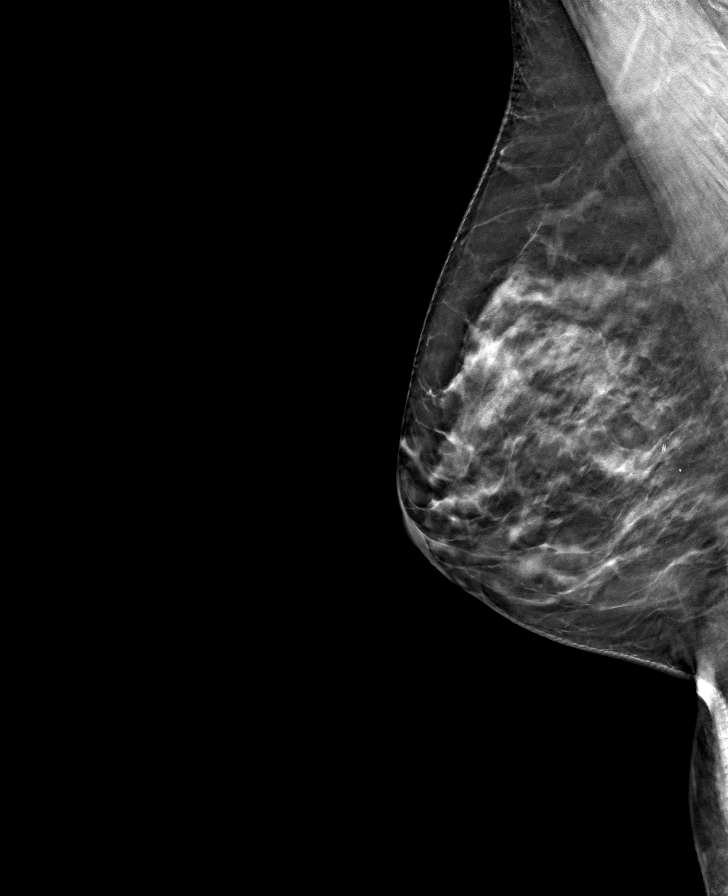

[L CC tomo · tomo slice 31/61.0]
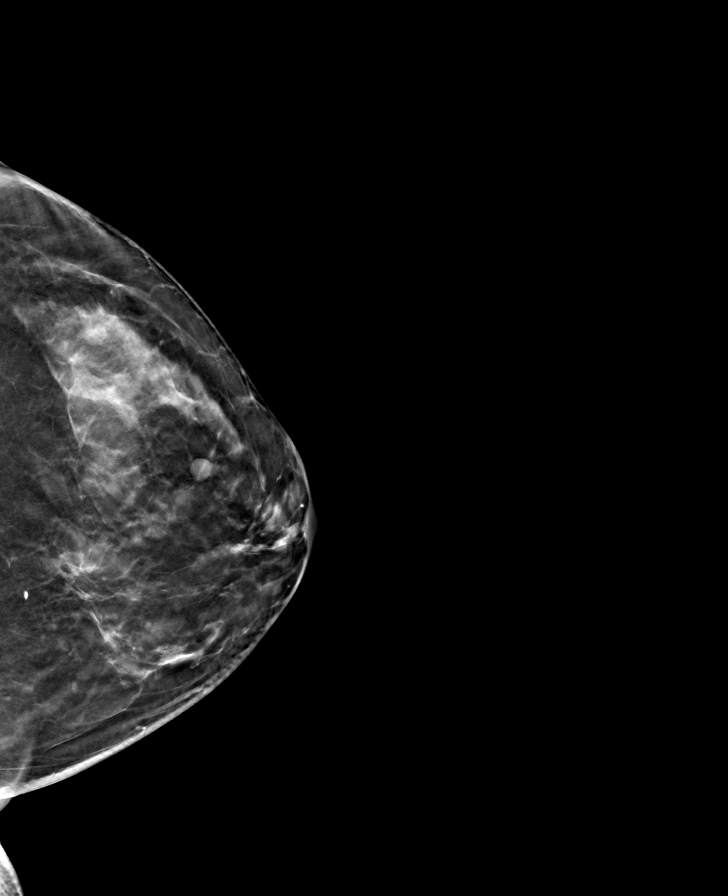

[8 of 24 positions shown; findings below may reference images not displayed]

ACR Breast Density Category c: The breast tissue is heterogeneously
dense, which may obscure small masses.
FINDINGS: There are no new or suspicious masses, no areas of nonsurgical
architectural distortion, no significant areas of asymmetry and no
new or suspicious calcifications. Stable bilateral post biopsy
marker clips.
IMPRESSION: 1. No evidence of breast carcinoma.
2. Benign post surgical changes on the left.

RECOMMENDATION:
Screening mammogram in one year.(Code:[VW])

I have discussed the findings and recommendations with the patient.
If applicable, a reminder letter will be sent to the patient
regarding the next appointment.

BI-RADS CATEGORY  2: Benign.

## 2021-10-27 ENCOUNTER — Telehealth: Payer: Self-pay | Admitting: Hematology and Oncology

## 2021-10-27 NOTE — Telephone Encounter (Signed)
.  Called patient to schedule appointment per 5/31 inbasket, patient is aware of date and time.   

## 2021-11-12 ENCOUNTER — Other Ambulatory Visit: Payer: Self-pay

## 2021-11-12 ENCOUNTER — Inpatient Hospital Stay: Payer: BC Managed Care – PPO | Attending: Hematology and Oncology | Admitting: Hematology and Oncology

## 2021-11-12 ENCOUNTER — Encounter: Payer: Self-pay | Admitting: Hematology and Oncology

## 2021-11-12 VITALS — BP 149/79 | HR 77 | Temp 98.1°F | Resp 16 | Ht 60.0 in | Wt 153.1 lb

## 2021-11-12 DIAGNOSIS — D0502 Lobular carcinoma in situ of left breast: Secondary | ICD-10-CM | POA: Insufficient documentation

## 2021-11-12 DIAGNOSIS — Z1239 Encounter for other screening for malignant neoplasm of breast: Secondary | ICD-10-CM

## 2021-11-12 DIAGNOSIS — Z79810 Long term (current) use of selective estrogen receptor modulators (SERMs): Secondary | ICD-10-CM | POA: Insufficient documentation

## 2021-11-12 DIAGNOSIS — N6092 Unspecified benign mammary dysplasia of left breast: Secondary | ICD-10-CM | POA: Diagnosis not present

## 2022-04-12 ENCOUNTER — Ambulatory Visit (HOSPITAL_COMMUNITY)
Admission: RE | Admit: 2022-04-12 | Discharge: 2022-04-12 | Disposition: A | Discharge status: Home / Self Care | Payer: BC Managed Care – PPO | Source: Ambulatory Visit | Attending: Hematology and Oncology | Admitting: Hematology and Oncology

## 2022-04-12 DIAGNOSIS — Z1239 Encounter for other screening for malignant neoplasm of breast: Secondary | ICD-10-CM | POA: Diagnosis present

## 2022-04-12 DIAGNOSIS — N6092 Unspecified benign mammary dysplasia of left breast: Secondary | ICD-10-CM | POA: Insufficient documentation

## 2022-04-12 MED ORDER — GADOBUTROL 1 MMOL/ML IV SOLN
7.0000 mL | Freq: Once | INTRAVENOUS | Status: AC | PRN
  Administered 2022-04-12: 7 mL via INTRAVENOUS

## 2022-04-18 ENCOUNTER — Ambulatory Visit (HOSPITAL_COMMUNITY)
Admission: RE | Admit: 2022-04-18 | Discharge: 2022-04-18 | Disposition: A | Discharge status: Home / Self Care | Payer: BC Managed Care – PPO | Source: Ambulatory Visit | Attending: Hematology and Oncology | Admitting: Hematology and Oncology

## 2022-04-18 DIAGNOSIS — N6092 Unspecified benign mammary dysplasia of left breast: Secondary | ICD-10-CM | POA: Insufficient documentation

## 2022-04-18 DIAGNOSIS — Z1239 Encounter for other screening for malignant neoplasm of breast: Secondary | ICD-10-CM | POA: Insufficient documentation

## 2022-04-18 MED ORDER — GADOBUTROL 1 MMOL/ML IV SOLN
7.0000 mL | Freq: Once | INTRAVENOUS | Status: AC | PRN
  Administered 2022-04-18: 7 mL via INTRAVENOUS

## 2022-08-22 ENCOUNTER — Encounter: Payer: Self-pay | Admitting: Hematology and Oncology

## 2022-08-23 ENCOUNTER — Other Ambulatory Visit: Payer: Self-pay | Admitting: *Deleted

## 2022-08-23 MED ORDER — TAMOXIFEN CITRATE 20 MG PO TABS: 20.0000 mg | ORAL_TABLET | Freq: Every day | ORAL | 3 refills | Status: DC

## 2022-09-07 ENCOUNTER — Other Ambulatory Visit: Payer: Self-pay | Admitting: Hematology and Oncology

## 2022-09-07 DIAGNOSIS — Z1231 Encounter for screening mammogram for malignant neoplasm of breast: Secondary | ICD-10-CM

## 2022-10-25 ENCOUNTER — Ambulatory Visit
Admission: RE | Admit: 2022-10-25 | Discharge: 2022-10-25 | Disposition: A | Discharge status: Home / Self Care | Payer: BC Managed Care – PPO | Source: Ambulatory Visit | Attending: Hematology and Oncology | Admitting: Hematology and Oncology

## 2022-10-25 DIAGNOSIS — Z1231 Encounter for screening mammogram for malignant neoplasm of breast: Secondary | ICD-10-CM

## 2022-11-15 ENCOUNTER — Inpatient Hospital Stay: Payer: BC Managed Care – PPO | Attending: Hematology and Oncology | Admitting: Hematology and Oncology

## 2022-11-15 VITALS — BP 138/77 | HR 103 | Temp 97.7°F | Resp 16 | Wt 156.6 lb

## 2022-11-15 DIAGNOSIS — D0512 Intraductal carcinoma in situ of left breast: Secondary | ICD-10-CM | POA: Insufficient documentation

## 2022-11-15 DIAGNOSIS — E7800 Pure hypercholesterolemia, unspecified: Secondary | ICD-10-CM | POA: Insufficient documentation

## 2022-11-15 DIAGNOSIS — Z801 Family history of malignant neoplasm of trachea, bronchus and lung: Secondary | ICD-10-CM | POA: Diagnosis not present

## 2022-11-15 DIAGNOSIS — Z791 Long term (current) use of non-steroidal anti-inflammatories (NSAID): Secondary | ICD-10-CM | POA: Insufficient documentation

## 2022-11-15 DIAGNOSIS — Z79810 Long term (current) use of selective estrogen receptor modulators (SERMs): Secondary | ICD-10-CM | POA: Diagnosis not present

## 2022-11-15 DIAGNOSIS — Z1239 Encounter for other screening for malignant neoplasm of breast: Secondary | ICD-10-CM | POA: Diagnosis not present

## 2022-11-15 DIAGNOSIS — Z170 Estrogen receptor positive status [ER+]: Secondary | ICD-10-CM | POA: Diagnosis not present

## 2022-11-15 NOTE — Progress Notes (Signed)
[facility]  Telephone:(336) [phone] Fax:(336) [phone]    ID: Malaisha Silliman DOB: 05-29-1960  MR#: [id number]  BTD#:[id number]  Patient Care Team: Angelica Chessman, MD as PCP - General (Family Medicine) Magrinat, Valentino Hue, MD (Inactive) as Consulting Physician (Oncology) Emelia Loron, MD as Consulting Physician (General Surgery) Sydnee Cabal., MD as Referring Physician (Gastroenterology) Gillian Scarce, MD as Consulting Physician (Family Medicine) Kallie Locks, FNP (Family Medicine) OTHER MD:   CHIEF COMPLAINT: Lobular carcinoma in situ; breast cancer high risk  CURRENT TREATMENT: Tamoxifen; intensified screening   INTERVAL HISTORY: Mariah Haley returns today for follow up of her LCIS and ALH. She continues on tamoxifen.  She is doing well. No complaints She denies any hot flashes, vaginal bleeding, LE swelling, or chest pain/SOB She is doing strength training for the past 7 months and feels much stronger and better. Rest of the pertinent 10 point ROS reviewed and negative.    COVID 19 VACCINATION STATUS: Status post Moderna x2 with booster October 2021   HISTORY OF CURRENT ILLNESS: From the original intake note:  "Mariah Haley" Amrit Erck had routine screening mammography on 03/07/2018 showing a possible abnormality in the left breast. She underwent unilateral left diagnostic mammography with tomography and left breast ultrason[facility] on 03/14/2018 showing: Breast Density Category C. Additional mammographic views of the left breast demonstrate no reproducible distortion in the medial left breast, posterior depth. There is however an asymmetry with a few associated microcalcifications in the medial left breast, posterior depth, seen on both spot compression craniocaudal tomosynthesis views, image 41/69 and image 37/64. On physical exam, no suspicious masses are palpated. Targeted ultrasound is performed, showing dense breast  parenchyma, but no sonographically identifiable mass. There is no evidence of left axillary lymphadenopathy.  Accordingly on 03/20/2018 she proceeded to biopsy of the left breast area in question. The pathology from this procedure showed [id number]):  Breast, left, needle core biopsy, medial - lobular neoplasia (atypical lobular hyperplasia). - fibrocystic changes with calcifications.   - pseudoangiomatous stromal hyperplasia (pash).  She underwent repeat unilateral left diagnostic mammography with tomography at [facility] on 11/10/2018 showing: Breast Density Category C. Indeterminate left breast asymmetry as originally identified on the patient's screening and diagnostic workup. A biopsy clip at the site of patient's known atypical lobular hyperplasia is approximately 2 cm medial to this area.  On 11/20/2018 she proceeded to biopsy of the left breast area in question. The pathology from this procedure showed ([id number]): Breast, left, needle core biopsy, uiq, posterior - lobular neoplasia (atypical lobular hyperplasia). - fibrocystic changes with calcifications.   - pseudoangiomatous stromal hyperplasia (pash).  Then, on 01/17/2019, she underwent a left lumpectomy. The pathology from this procedure showed ([id number]):  Breast, lumpectomy, Left w/seed   - lobular carcinoma in situ  The patient's subsequent history is as detailed below.   PAST MEDICAL HISTORY: Past Medical History:  Diagnosis Date   Gilbert's syndrome    Hypercholesterolemia      PAST SURGICAL HISTORY: Past Surgical History:  Procedure Laterality Date   BREAST BIOPSY Left 02/2018   BREAST BIOPSY Left 10/2018   BREAST BIOPSY Right 04/26/2019   BREAST BIOPSY Left 04/2019   BREAST BIOPSY Left 04/2020   BREAST EXCISIONAL BIOPSY Left 12/2018   BREAST EXCISIONAL BIOPSY Left 06/2019   DILATION AND CURETTAGE OF UTERUS     EYE SURGERY     "as a child"   RADIOACTIVE SEED GUIDED EXCISIONAL BREAST BIOPSY  Left  01/17/2019   Procedure: RADIOACTIVE SEED GUIDED LEFT BREAST LUMPECTOMY;  Surgeon: Emelia Loron, MD;  Location: [facility];  Service: General;  Laterality: Left;   RADIOACTIVE SEED GUIDED EXCISIONAL BREAST BIOPSY Left 07/17/2019   Procedure: RADIOACTIVE SEED GUIDED EXCISIONAL LEFT BREAST BIOPSY;  Surgeon: Emelia Loron, MD;  Location: [facility];  Service: General;  Laterality: Left;    FAMILY HISTORY: No family history on file. Kacy's father died at the age of 66 from heart problems (first heart attack age 98). Patients' mother is 97 years old as of September 2020. The patient has 1 brothers and 2 sisters. Patient denies anyone in her family having breast, ovarian, prostate, or pancreatic cancer.  There are some lung cancers in the family, all of them smokers   GYNECOLOGIC HISTORY:  Patient's last menstrual period was 02/10/2011. 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  Her husband Chalmer "Onalee Hua" is a former Proofreader, now retired.  Daughter Letha Cape, 23, is a Comptroller at [employer] in [place]; daughter Margo Aye is an Transport planner in [place]; Dr. Santina Evans is getting it certified social work degree in [place].  No grandchildren.  The patient is a [employer]   ADVANCED DIRECTIVES: In the absence of documents to the contrary the patient's husband is her healthcare power of attorney   HEALTH MAINTENANCE: Social History   Tobacco Use   Smoking status: Never   Smokeless tobacco: Never  Substance Use Topics   Alcohol use: Yes    Comment: rare   Drug use: Never    Colonoscopy: 2019/[facility]  PAP: 2018/ [facility]  Bone density: no   No Known Allergies  Current  Outpatient Medications  Medication Sig Dispense Refill   diclofenac (VOLTAREN) 75 MG EC tablet Take 1 tablet (75 mg total) by mouth 2 (two) times daily. 50 tablet 2   tamoxifen (NOLVADEX) 20 MG tablet Take 1 tablet (20 mg total) by mouth daily. 90 tablet 3   valACYclovir (VALTREX) 500 MG tablet      No current facility-administered medications for this visit.    OBJECTIVE: White woman in no acute distress  Vitals:   11/15/22 1258  BP: 138/77  Pulse: (!) 103  Resp: 16  Temp: 97.7 F (36.5 C)  SpO2: 95%   Wt Readings from Last 3 Encounters:  11/15/22 156 lb 9.6 oz (71 kg)  11/12/21 153 lb 1.6 oz (69.4 kg)  08/05/20 171 lb 1.6 oz (77.6 kg)   Body mass index is 30.58 kg/m.    Repeat blood pressure 154/85  ECOG FS:1 - Symptomatic but completely ambulatory  Physical Exam Constitutional:      Appearance: Normal appearance.  Pulmonary:     Effort: Pulmonary effort is normal.     Breath sounds: Normal breath sounds.  Abdominal:     General: Abdomen is flat. Bowel sounds are normal.     Palpations: Abdomen is soft.  Musculoskeletal:        General: Normal range of motion.     Cervical back: Normal range of motion and neck supple. No rigidity.  Lymphadenopathy:     Cervical: No cervical adenopathy.  Skin:    General: Skin is warm and dry.  Neurological:  General: No focal deficit present.     Mental Status: She is alert.       LAB RESULTS:  CMP  No results found for: "NA", "K", "CL", "CO2", "GLUCOSE", "BUN", "CREATININE", "CALCIUM", "PROT", "ALBUMIN", "AST", "ALT", "ALKPHOS", "BILITOT", "GFRNONAA", "GFRAA"  No results found for: "TOTALPROTELP", "ALBUMINELP", "A1GS", "A2GS", "BETS", "BETA2SER", "GAMS", "MSPIKE", "SPEI"  No results found for: "KPAFRELGTCHN", "LAMBDASER", "KAPLAMBRATIO"  No results found for: "WBC", "NEUTROABS", "HGB", "HCT", "MCV", "PLT"  No results found for: "LABCA2"  No components found for: "NWGNFA213"  No results for input(s): "INR"  in the last 168 hours.  No results found for: "LABCA2"  No results found for: "YQM578"  No results found for: "CAN125"  No results found for: "CAN153"  No results found for: "CA2729"  No components found for: "HGQUANT"  No results found for: "CEA1", "CEA" / No results found for: "CEA1", "CEA"   No results found for: "AFPTUMOR"  No results found for: "CHROMOGRNA"  No results found for: "TOTALPROTELP", "ALBUMINELP", "A1GS", "A2GS", "BETS", "BETA2SER", "GAMS", "MSPIKE", "SPEI" (this displays SPEP labs)  No results found for: "KPAFRELGTCHN", "LAMBDASER", "KAPLAMBRATIO" (kappa/lambda light chains)  No results found for: "HGBA", "HGBA2QUANT", "HGBFQUANT", "HGBSQUAN" (Hemoglobinopathy evaluation)   No results found for: "LDH"  No results found for: "IRON", "TIBC", "IRONPCTSAT" (Iron and TIBC)  No results found for: "FERRITIN"  Urinalysis No results found for: "COLORURINE", "APPEARANCEUR", "LABSPEC", "PHURINE", "GLUCOSEU", "HGBUR", "BILIRUBINUR", "KETONESUR", "PROTEINUR", "UROBILINOGEN", "NITRITE", "LEUKOCYTESUR"   STUDIES:  MM 3D SCREENING MAMMOGRAM BILATERAL BREAST  Result Date: 10/26/2022 CLINICAL DATA:  Screening. EXAM: DIGITAL SCREENING BILATERAL MAMMOGRAM WITH TOMOSYNTHESIS AND CAD TECHNIQUE: Bilateral screening digital craniocaudal and mediolateral oblique mammograms were obtained. Bilateral screening digital breast tomosynthesis was performed. The images were evaluated with computer-aided detection. COMPARISON:  Previous exam(s). ACR Breast Density Category c: The breasts are heterogeneously dense, which may obscure small masses. FINDINGS: There are no findings suspicious for malignancy. IMPRESSION: No mammographic evidence of malignancy. A result letter of this screening mammogram will be mailed directly to the patient. RECOMMENDATION: Screening mammogram in one year. (Code:SM-B-01Y) BI-RADS CATEGORY  1: Negative. Electronically Signed   By: Norva Pavlov M.D.   On:  10/26/2022 14:57      ELIGIBLE FOR AVAILABLE RESEARCH PROTOCOL: no   ASSESSMENT:   62 y.o. [place], [place] woman status post left breast biopsy 03/20/2018 and 11/20/2018 showing atypical lobular hyperplasia  (1) status post left lumpectomy 01/17/2019 showing lobular carcinoma in situ  (2) breast cancer high risk (30 to 50% lifetime risk)  (a) risk reduction: Tamoxifen started 02/22/2019  (b) intensified screening:   Breast MRI yearly alternating with    Breast Mammography yearly  (3) status post repeat left lumpectomy 07/17/2019 again showing atypical lobular hyperplasia   PLAN:  She has been taking tamoxifen for the past 3-1/2 years now.  She will complete 5 years of antiestrogen therapy in October 2025.  Her most recent mammogram is unremarkable, no concerns on breast exam.  She is eating healthy, exercising regularly. MRI breast ordered for follow-up this year.  She will continue self breast exam and return to clinic in 1 year or sooner as needed. Total time spent: 20 minutes   *Total Encounter Time as defined by the Centers for Medicare and Medicaid Services includes, in addition to the face-to-face time of a patient visit (documented in the note above) non-face-to-face time: obtaining and reviewing outside history, ordering and reviewing medications, tests or procedures, care coordination (communications with other health care professionals or caregivers) and documentation in  the medical record.

## 2022-11-15 NOTE — Addendum Note (Signed)
Addended by: Jackquline Denmark on: 11/15/2022 01:56 PM   Modules accepted: Orders

## 2023-04-15 ENCOUNTER — Ambulatory Visit
Admission: RE | Admit: 2023-04-15 | Discharge: 2023-04-15 | Disposition: A | Discharge status: Home / Self Care | Payer: BC Managed Care – PPO | Source: Ambulatory Visit | Attending: Hematology and Oncology

## 2023-04-15 DIAGNOSIS — Z1239 Encounter for other screening for malignant neoplasm of breast: Secondary | ICD-10-CM

## 2023-04-15 MED ORDER — GADOPICLENOL 0.5 MMOL/ML IV SOLN
7.0000 mL | Freq: Once | INTRAVENOUS | Status: AC | PRN
  Administered 2023-04-15: 7 mL via INTRAVENOUS

## 2023-08-14 ENCOUNTER — Encounter: Payer: Self-pay | Admitting: Hematology and Oncology

## 2023-08-15 ENCOUNTER — Other Ambulatory Visit: Payer: Self-pay | Admitting: *Deleted

## 2023-08-15 MED ORDER — TAMOXIFEN CITRATE 20 MG PO TABS: 20.0000 mg | ORAL_TABLET | Freq: Every day | ORAL | 4 refills | Status: AC

## 2023-09-14 ENCOUNTER — Other Ambulatory Visit: Payer: Self-pay | Admitting: Family Medicine

## 2023-09-14 DIAGNOSIS — Z1231 Encounter for screening mammogram for malignant neoplasm of breast: Secondary | ICD-10-CM

## 2023-10-27 ENCOUNTER — Ambulatory Visit
Admission: RE | Admit: 2023-10-27 | Discharge: 2023-10-27 | Disposition: A | Discharge status: Home / Self Care | Source: Ambulatory Visit | Attending: Family Medicine | Admitting: Family Medicine

## 2023-10-27 DIAGNOSIS — Z1231 Encounter for screening mammogram for malignant neoplasm of breast: Secondary | ICD-10-CM

## 2023-11-15 ENCOUNTER — Inpatient Hospital Stay: Payer: BC Managed Care – PPO | Attending: Hematology and Oncology | Admitting: Hematology and Oncology

## 2023-11-15 VITALS — BP 126/51 | HR 86 | Temp 98.5°F | Resp 17 | Ht 60.0 in | Wt 156.8 lb

## 2023-11-15 DIAGNOSIS — D0502 Lobular carcinoma in situ of left breast: Secondary | ICD-10-CM | POA: Insufficient documentation

## 2023-11-15 DIAGNOSIS — Z1239 Encounter for other screening for malignant neoplasm of breast: Secondary | ICD-10-CM | POA: Diagnosis not present

## 2023-11-15 DIAGNOSIS — Z79899 Other long term (current) drug therapy: Secondary | ICD-10-CM | POA: Insufficient documentation

## 2023-11-15 DIAGNOSIS — Z801 Family history of malignant neoplasm of trachea, bronchus and lung: Secondary | ICD-10-CM | POA: Insufficient documentation

## 2023-11-15 DIAGNOSIS — E785 Hyperlipidemia, unspecified: Secondary | ICD-10-CM | POA: Insufficient documentation

## 2023-11-15 DIAGNOSIS — E7800 Pure hypercholesterolemia, unspecified: Secondary | ICD-10-CM | POA: Diagnosis not present

## 2023-11-15 DIAGNOSIS — Z170 Estrogen receptor positive status [ER+]: Secondary | ICD-10-CM | POA: Diagnosis not present

## 2023-11-15 DIAGNOSIS — Z79810 Long term (current) use of selective estrogen receptor modulators (SERMs): Secondary | ICD-10-CM | POA: Insufficient documentation

## 2023-11-15 NOTE — Progress Notes (Signed)
 [name]  Telephone:(336) [phone] Fax:(336) [phone]    ID: Mariah Haley DOB: [birth year]  MR#: [id number]  RDW#:[id number]  Patient Care Team: Mariah Bene, MD as PCP - General (Family Medicine) Magrinat, Mariah BROCKS, MD (Inactive) as Consulting Physician (Oncology) Mariah Cough, MD as Consulting Physician (General Surgery) Mariah Ronal BIRCH., MD as Referring Physician (Gastroenterology) Mariah Catheryn POUR, MD as Consulting Physician (Family Medicine) Stroud, Mariah Haley (Family Medicine) OTHER MD:   CHIEF COMPLAINT: Lobular carcinoma in situ; breast cancer high risk  CURRENT TREATMENT: Tamoxifen ; intensified screening  Discussed the use of AI scribe software for clinical note transcription with the patient, who gave verbal consent to proceed.  History of Present Illness Mariah Haley is a 63 year old female with a history of LCIS and ALH who presents for follow-up regarding her tamoxifen  therapy and cholesterol management.  She has been on tamoxifen  since October 2020, completing a five-year course. She experiences intermittent hot flashes but prefers to manage them without additional medication. She underwent a mammogram in June 2025 and is scheduled for an MRI in November 2025. She has a history of hysterectomy, which mitigates concerns about endometrial hyperplasia associated with tamoxifen  use.  She is currently taking Zetia, 10 mg daily, for cholesterol management due to a history of elevated liver enzymes with statin use. She is awaiting lab results to assess the effectiveness of Zetia on her cholesterol levels. She has previously been on and off statins due to liver enzyme elevation.  She engages in regular physical activity, including strength training and cardio, and occasionally plays pickleball. She experiences breast tenderness occasionally, which she associates with caffeine intake. No vaginal discharge.  Rest of the pertinent 10  point ROS reviewed and neg.    COVID 19 VACCINATION STATUS: Status post Moderna x2 with booster October 2021   HISTORY OF CURRENT ILLNESS: From the original intake note:  Mariah Haley had routine screening mammography on 03/07/2018 showing a possible abnormality in the left breast. She underwent unilateral left diagnostic mammography with tomography and left breast ultrasonography at [facility] on 03/14/2018 showing: Breast Density Category C. Additional mammographic views of the left breast demonstrate no reproducible distortion in the medial left breast, posterior depth. There is however an asymmetry with a few associated microcalcifications in the medial left breast, posterior depth, seen on both spot compression craniocaudal tomosynthesis views, image 41/69 and image 37/64. On physical exam, no suspicious masses are palpated. Targeted ultrasound is performed, showing dense breast parenchyma, but no sonographically identifiable mass. There is no evidence of left axillary lymphadenopathy.  Accordingly on 03/20/2018 she proceeded to biopsy of the left breast area in question. The pathology from this procedure showed [id number]):  Breast, left, needle core biopsy, medial - lobular neoplasia (atypical lobular hyperplasia). - fibrocystic changes with calcifications.   - pseudoangiomatous stromal hyperplasia (pash).  She underwent repeat unilateral left diagnostic mammography with tomography at [facility] on 11/10/2018 showing: Breast Density Category C. Indeterminate left breast asymmetry as originally identified on the patient's screening and diagnostic workup. A biopsy clip at the site of patient's known atypical lobular hyperplasia is approximately 2 cm medial to this area.  On 11/20/2018 she proceeded to biopsy of the left breast area in question. The pathology from this procedure showed ([id number]): Breast, left, needle core biopsy, uiq, posterior - lobular  neoplasia (atypical lobular hyperplasia). - fibrocystic changes with calcifications.   - pseudoangiomatous stromal hyperplasia (pash).  Then, on 01/17/2019, she underwent  a left lumpectomy. The pathology from this procedure showed ([id number]):  Breast, lumpectomy, Left w/seed   - lobular carcinoma in situ  The patient's subsequent history is as detailed below.   PAST MEDICAL HISTORY: Past Medical History:  Diagnosis Date   Gilbert's syndrome    Hypercholesterolemia      PAST SURGICAL HISTORY: Past Surgical History:  Procedure Laterality Date   BREAST BIOPSY Left 02/2018   BREAST BIOPSY Left 10/2018   BREAST BIOPSY Right 04/26/2019   BREAST BIOPSY Left 04/2019   BREAST BIOPSY Left 04/2020   BREAST EXCISIONAL BIOPSY Left 12/2018   BREAST EXCISIONAL BIOPSY Left 06/2019   DILATION AND CURETTAGE OF UTERUS     EYE SURGERY     as a child   RADIOACTIVE SEED GUIDED EXCISIONAL BREAST BIOPSY Left 01/17/2019   Procedure: RADIOACTIVE SEED GUIDED LEFT BREAST LUMPECTOMY;  Surgeon: Mariah Cough, MD;  Location: [facility];  Service: General;  Laterality: Left;   RADIOACTIVE SEED GUIDED EXCISIONAL BREAST BIOPSY Left 07/17/2019   Procedure: RADIOACTIVE SEED GUIDED EXCISIONAL LEFT BREAST BIOPSY;  Surgeon: Mariah Cough, MD;  Location: [facility];  Service: General;  Laterality: Left;    FAMILY HISTORY: No family history on file. Mariah Haley's father died at the age of 72 from heart problems (first heart attack age 19). Patients' mother is 39 years old as of September 2020. The patient has 1 brothers and 2 sisters. Patient denies anyone in her family having breast, ovarian, prostate, or pancreatic cancer.  There are some lung cancers in the family, all of them smokers   GYNECOLOGIC HISTORY:  Patient's last menstrual period was 02/10/2011. 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  Her husband Mariah Haley is a former Proofreader, now retired.  Daughter Mellissa, 70, is a Comptroller at [employer] in [place]; daughter Marline is an Transport planner in [place]; Dr. Dorothyann is getting it certified social work degree in [place].  No grandchildren.  The patient is a [employer]   ADVANCED DIRECTIVES: In the absence of documents to the contrary the patient's husband is her healthcare power of attorney   HEALTH MAINTENANCE: Social History   Tobacco Use   Smoking status: Never   Smokeless tobacco: Never  Substance Use Topics   Alcohol use: Yes    Comment: rare   Drug use: Never    Colonoscopy: 2019/[facility]  PAP: 2018/ [facility]  Bone density: no   No Known Allergies  Current Outpatient Medications  Medication Sig Dispense Refill   ezetimibe (ZETIA) 10 MG tablet Take 10 mg by mouth daily.     hydrochlorothiazide (HYDRODIURIL) 25 MG tablet Take 25 mg by mouth daily.     tamoxifen  (NOLVADEX ) 20 MG tablet Take 1 tablet (20 mg total) by mouth daily. 90 tablet 4   valACYclovir (VALTREX) 500 MG tablet      No current facility-administered medications for this visit.    OBJECTIVE: White woman in no acute distress  Vitals:   11/15/23 1320  BP: (!) 126/51  Pulse: 86  Resp: 17  Temp: 98.5 F (36.9 C)  SpO2: 98%   Wt Readings from Last 3 Encounters:  11/15/23 156 lb 12.8 oz (71.1 kg)  11/15/22  156 lb 9.6 oz (71 kg)  11/12/21 153 lb 1.6 oz (69.4 kg)   Body mass index is 30.62 kg/m.    Repeat blood pressure 154/85  ECOG FS:1 - Symptomatic but completely ambulatory  Physical Exam Constitutional:      Appearance: Normal appearance.  Pulmonary:     Effort: Pulmonary effort is normal.     Breath sounds: Normal breath sounds.  Abdominal:     General:  Abdomen is flat. Bowel sounds are normal.     Palpations: Abdomen is soft.   Musculoskeletal:        General: Normal range of motion.     Cervical back: Normal range of motion and neck supple. No rigidity.  Lymphadenopathy:     Cervical: No cervical adenopathy.   Skin:    General: Skin is warm and dry.   Neurological:     General: No focal deficit present.     Mental Status: She is alert.       LAB RESULTS:  CMP  No results found for: NA, K, CL, CO2, GLUCOSE, BUN, CREATININE, CALCIUM, PROT, ALBUMIN, AST, ALT, ALKPHOS, BILITOT, GFRNONAA, GFRAA  No results found for: TOTALPROTELP, ALBUMINELP, A1GS, A2GS, BETS, BETA2SER, GAMS, MSPIKE, SPEI  No results found for: KPAFRELGTCHN, LAMBDASER, KAPLAMBRATIO  No results found for: WBC, NEUTROABS, HGB, HCT, MCV, PLT  No results found for: LABCA2  No components found for: [id number]  No results for input(s): INR in the last 168 hours.  No results found for: LABCA2  No results found for: RJW800  No results found for: CAN125  No results found for: CAN153  No results found for: CA2729  No components found for: HGQUANT  No results found for: CEA1, CEA / No results found for: CEA1, CEA   No results found for: AFPTUMOR  No results found for: CHROMOGRNA  No results found for: TOTALPROTELP, ALBUMINELP, A1GS, A2GS, BETS, BETA2SER, GAMS, MSPIKE, SPEI (this displays SPEP labs)  No results found for: KPAFRELGTCHN, LAMBDASER, KAPLAMBRATIO (kappa/lambda light chains)  No results found for: HGBA, HGBA2QUANT, HGBFQUANT, HGBSQUAN (Hemoglobinopathy evaluation)   No results found for: LDH  No results found for: IRON, TIBC, IRONPCTSAT (Iron and TIBC)  No results found for: FERRITIN  Urinalysis No results found for: COLORURINE, APPEARANCEUR, LABSPEC, PHURINE, GLUCOSEU, HGBUR,  BILIRUBINUR, KETONESUR, PROTEINUR, UROBILINOGEN, NITRITE, LEUKOCYTESUR   STUDIES:  MM 3D SCREENING MAMMOGRAM BILATERAL BREAST Result Date: 11/02/2023 CLINICAL DATA:  Screening. EXAM: DIGITAL SCREENING BILATERAL MAMMOGRAM WITH TOMOSYNTHESIS AND CAD TECHNIQUE: Bilateral screening digital craniocaudal and mediolateral oblique mammograms were obtained. Bilateral screening digital breast tomosynthesis was performed. The images were evaluated with computer-aided detection. COMPARISON:  Previous exam(s). ACR Breast Density Category c: The breasts are heterogeneously dense, which may obscure small masses. FINDINGS: There are no findings suspicious for malignancy. IMPRESSION: No mammographic evidence of malignancy. A result letter of this screening mammogram will be mailed directly to the patient. RECOMMENDATION: Screening mammogram in one year. (Code:SM-B-01Y) BI-RADS CATEGORY  1: Negative. Electronically Signed   By: Toribio Agreste M.D.   On: 11/02/2023 11:55      ELIGIBLE FOR AVAILABLE RESEARCH PROTOCOL: no   ASSESSMENT:   63 y.o. [place], [place] woman status post left breast biopsy 03/20/2018 and 11/20/2018 showing atypical lobular hyperplasia  (1) status post left lumpectomy 01/17/2019 showing lobular carcinoma in situ  (2) breast cancer high risk (30 to 50% lifetime risk)  (a) risk reduction: Tamoxifen  started 02/22/2019  (b) intensified screening:   Breast MRI yearly alternating with    Breast  Mammography yearly  (3) status post repeat left lumpectomy 07/17/2019 again showing atypical lobular hyperplasia   PLAN:  Assessment and Plan Assessment & Plan Invasive breast cancer On tamoxifen  since October 2020. Prefers no additional medication for hot flashes. Plan to discontinue tamoxifen  after five years due to lack of strong data supporting use beyond this period for noninvasive cancers. Aware of small risk of blood clots, which doubles after five years. Comfortable with  discontinuing tamoxifen  once the current prescription runs out. Will continue with regular mammograms and MRIs for monitoring. Discussed option of contrast-enhanced mammogram as an alternative to MRI. - Order MRI for November 2025. - Order mammogram for June 2026.  Hyperlipidemia Managed with Zetia due to elevated liver enzymes with statin use. Zetia works through the stomach and is not expected to affect liver levels. Scheduled for lab work next week to assess Zetia effectiveness. Discussed possibility of newer cholesterol medications like Repatha if needed - Continue Zetia. - Evaluate lab results next week to assess Zetia effectiveness.   Time spent: 30 min  *Total Encounter Time as defined by the Centers for Medicare and Medicaid Services includes, in addition to the face-to-face time of a patient visit (documented in the note above) non-face-to-face time: obtaining and reviewing outside history, ordering and reviewing medications, tests or procedures, care coordination (communications with other health care professionals or caregivers) and documentation in the medical record.

## 2023-12-13 ENCOUNTER — Telehealth: Payer: Self-pay | Admitting: Podiatry

## 2023-12-13 NOTE — Telephone Encounter (Signed)
 Patient would like to order a new pair of inserts. She states she was told they would last 1-3 years and to call in to order when needed.

## 2023-12-14 NOTE — Telephone Encounter (Signed)
 Trish, this patient can go directly to you for orthotics

## 2023-12-14 NOTE — Telephone Encounter (Signed)
 LM for patient to CB and schedule an appt we have not seen her since 2022 when billing insurance Dr. needs to see patient if it has been over a year, no exceptions  Also we do not have scans on file for the company we use to use at that time. We switched orthotic manufacture in 2024 and no longer have access to old scans

## 2023-12-15 NOTE — Telephone Encounter (Signed)
 Ok Lm for patient again to schedule with me to get new scans / impressions  Lolita Schultze CPed, CFo, CFm

## 2023-12-20 ENCOUNTER — Telehealth: Payer: Self-pay | Admitting: Podiatry

## 2023-12-20 NOTE — Telephone Encounter (Signed)
 Interest in what the cost is going to be for new scans / impressions  before making appointment.

## 2023-12-23 NOTE — Telephone Encounter (Signed)
 Called to schedule orthotics appt. Pt hasn't been seen since 2022 I told pt she needs an appt with the doctor before she gets orthotics. She said she will call back to schedule.

## 2023-12-23 NOTE — Telephone Encounter (Signed)
 Thank you :)

## 2024-05-02 ENCOUNTER — Encounter: Payer: Self-pay | Admitting: Hematology and Oncology

## 2024-05-19 ENCOUNTER — Ambulatory Visit
Admission: RE | Admit: 2024-05-19 | Discharge: 2024-05-19 | Disposition: A | Source: Ambulatory Visit | Attending: Hematology and Oncology

## 2024-05-19 DIAGNOSIS — Z1239 Encounter for other screening for malignant neoplasm of breast: Secondary | ICD-10-CM

## 2024-05-19 MED ORDER — GADOPICLENOL 0.5 MMOL/ML IV SOLN
7.0000 mL | Freq: Once | INTRAVENOUS | Status: AC | PRN
  Administered 2024-05-19: 7 mL via INTRAVENOUS

## 2024-11-14 ENCOUNTER — Ambulatory Visit: Admitting: Hematology and Oncology
